# Patient Record
Sex: Male | Born: 1950 | Race: White | Hispanic: No | Smoking: Former smoker
Health system: Southern US, Community
[De-identification: ages and names within clinical notes are randomized; demographics above are authoritative.]

## PROBLEM LIST (undated history)

## (undated) DIAGNOSIS — I10 Essential (primary) hypertension: Secondary | ICD-10-CM

## (undated) DIAGNOSIS — G20A1 Parkinson's disease without dyskinesia, without mention of fluctuations: Secondary | ICD-10-CM

## (undated) DIAGNOSIS — G20 Parkinson's disease: Secondary | ICD-10-CM

## (undated) DIAGNOSIS — F0280 Dementia in other diseases classified elsewhere without behavioral disturbance: Secondary | ICD-10-CM

## (undated) DIAGNOSIS — K469 Unspecified abdominal hernia without obstruction or gangrene: Secondary | ICD-10-CM

## (undated) DIAGNOSIS — R32 Unspecified urinary incontinence: Secondary | ICD-10-CM

## (undated) DIAGNOSIS — E785 Hyperlipidemia, unspecified: Secondary | ICD-10-CM

## (undated) DIAGNOSIS — K219 Gastro-esophageal reflux disease without esophagitis: Secondary | ICD-10-CM

## (undated) HISTORY — PX: INGUINAL HERNIA REPAIR: SUR1180

## (undated) HISTORY — DX: Essential (primary) hypertension: I10

## (undated) HISTORY — DX: Parkinson's disease without dyskinesia, without mention of fluctuations: G20.A1

## (undated) HISTORY — DX: Dementia in other diseases classified elsewhere, unspecified severity, without behavioral disturbance, psychotic disturbance, mood disturbance, and anxiety: G20

## (undated) HISTORY — PX: VASECTOMY: SHX75

## (undated) HISTORY — DX: Unspecified urinary incontinence: R32

## (undated) HISTORY — DX: Dementia in other diseases classified elsewhere, unspecified severity, without behavioral disturbance, psychotic disturbance, mood disturbance, and anxiety: F02.80

---

## 1999-11-26 ENCOUNTER — Ambulatory Visit (HOSPITAL_COMMUNITY): Admission: AD | Admit: 1999-11-26 | Discharge: 1999-11-26 | Payer: Self-pay | Admitting: Cardiovascular Disease

## 2004-01-25 ENCOUNTER — Ambulatory Visit: Payer: Self-pay | Admitting: Internal Medicine

## 2004-02-01 ENCOUNTER — Ambulatory Visit: Payer: Self-pay | Admitting: Internal Medicine

## 2004-07-18 ENCOUNTER — Ambulatory Visit: Payer: Self-pay | Admitting: Unknown Physician Specialty

## 2006-07-14 ENCOUNTER — Encounter: Payer: Self-pay | Admitting: General Practice

## 2006-07-29 ENCOUNTER — Encounter: Payer: Self-pay | Admitting: General Practice

## 2006-09-15 ENCOUNTER — Ambulatory Visit: Payer: Self-pay | Admitting: Internal Medicine

## 2007-08-03 ENCOUNTER — Ambulatory Visit: Payer: Self-pay | Admitting: Internal Medicine

## 2009-12-18 ENCOUNTER — Ambulatory Visit: Payer: Self-pay | Admitting: Unknown Physician Specialty

## 2009-12-19 LAB — PATHOLOGY REPORT

## 2010-06-29 ENCOUNTER — Emergency Department: Payer: Self-pay | Admitting: Emergency Medicine

## 2010-09-30 ENCOUNTER — Emergency Department: Payer: Self-pay | Admitting: Unknown Physician Specialty

## 2012-06-18 ENCOUNTER — Other Ambulatory Visit: Payer: Self-pay | Admitting: Diagnostic Neuroimaging

## 2012-07-23 ENCOUNTER — Other Ambulatory Visit: Payer: Self-pay | Admitting: Diagnostic Neuroimaging

## 2012-09-24 ENCOUNTER — Other Ambulatory Visit: Payer: Self-pay | Admitting: Diagnostic Neuroimaging

## 2012-11-18 ENCOUNTER — Encounter (HOSPITAL_COMMUNITY): Payer: Self-pay | Admitting: Emergency Medicine

## 2012-11-18 ENCOUNTER — Emergency Department (HOSPITAL_COMMUNITY)
Admission: EM | Admit: 2012-11-18 | Discharge: 2012-11-18 | Disposition: A | Discharge status: Home / Self Care | Payer: 59 | Attending: Emergency Medicine | Admitting: Emergency Medicine

## 2012-11-18 DIAGNOSIS — I10 Essential (primary) hypertension: Secondary | ICD-10-CM | POA: Insufficient documentation

## 2012-11-18 DIAGNOSIS — T428X1A Poisoning by antiparkinsonism drugs and other central muscle-tone depressants, accidental (unintentional), initial encounter: Secondary | ICD-10-CM | POA: Insufficient documentation

## 2012-11-18 DIAGNOSIS — G20A1 Parkinson's disease without dyskinesia, without mention of fluctuations: Secondary | ICD-10-CM | POA: Insufficient documentation

## 2012-11-18 DIAGNOSIS — G20 Parkinson's disease: Secondary | ICD-10-CM | POA: Insufficient documentation

## 2012-11-18 DIAGNOSIS — T428X5A Adverse effect of antiparkinsonism drugs and other central muscle-tone depressants, initial encounter: Secondary | ICD-10-CM | POA: Insufficient documentation

## 2012-11-18 DIAGNOSIS — Z79899 Other long term (current) drug therapy: Secondary | ICD-10-CM | POA: Insufficient documentation

## 2012-11-18 DIAGNOSIS — R112 Nausea with vomiting, unspecified: Secondary | ICD-10-CM | POA: Insufficient documentation

## 2012-11-18 DIAGNOSIS — Z87891 Personal history of nicotine dependence: Secondary | ICD-10-CM | POA: Insufficient documentation

## 2012-11-18 DIAGNOSIS — Z8719 Personal history of other diseases of the digestive system: Secondary | ICD-10-CM | POA: Insufficient documentation

## 2012-11-18 DIAGNOSIS — T50905A Adverse effect of unspecified drugs, medicaments and biological substances, initial encounter: Secondary | ICD-10-CM

## 2012-11-18 HISTORY — DX: Parkinson's disease: G20

## 2012-11-18 HISTORY — DX: Parkinson's disease without dyskinesia, without mention of fluctuations: G20.A1

## 2012-11-18 HISTORY — DX: Unspecified abdominal hernia without obstruction or gangrene: K46.9

## 2012-11-18 MED ORDER — SODIUM CHLORIDE 0.9 % IV BOLUS (SEPSIS)
1000.0000 mL | Freq: Once | INTRAVENOUS | Status: AC
  Administered 2012-11-18: 1000 mL via INTRAVENOUS

## 2012-11-18 MED ORDER — ONDANSETRON HCL 4 MG/2ML IJ SOLN
4.0000 mg | Freq: Once | INTRAMUSCULAR | Status: AC
  Administered 2012-11-18: 4 mg via INTRAVENOUS
  Filled 2012-11-18: qty 2

## 2012-11-18 MED ORDER — SODIUM CHLORIDE 0.9 % IV SOLN
INTRAVENOUS | Status: DC
Start: 2012-11-18 — End: 2012-11-18
  Administered 2012-11-18: 18:00:00 via INTRAVENOUS

## 2012-11-18 NOTE — ED Provider Notes (Signed)
CSN: [id number]     Arrival date & time 11/18/12  1520 History     First MD Initiated Contact with Patient 11/18/12 1547     Chief Complaint  Patient presents with  . Nausea  . Emesis   (Consider location/radiation/quality/duration/timing/severity/associated sxs/prior Treatment) Patient is a 62 y.o. male presenting with vomiting. The history is provided by the patient.  Emesis  after possibly taking an extra dose of his carbidopa prior to arrival. Patient was told to increase his dose to 100 mg a day from 50 mg a day. Patient thinks he mistakenly took 200 mg tablets. Has had nausea vomiting since then. Denies any headache. No syncope or near-syncope. Does note some dizziness. Takes carbidopa for his Parkinson's disease. No other medications used. Called EMS from work and was transported here  Past Medical History  Diagnosis Date  . Parkinson disease   . Abdominal hernia   . Hypertension    Past Surgical History  Procedure Laterality Date  . Hernia repair     History reviewed. No pertinent family history. History  Substance Use Topics  . Smoking status: Former Games developer  . Smokeless tobacco: Not on file  . Alcohol Use: Yes     Comment: occ    Review of Systems  Gastrointestinal: Positive for vomiting.  All other systems reviewed and are negative.    Allergies  Sulfa antibiotics and Tylenol  Home Medications   Current Outpatient Rx  Name  Route  Sig  Dispense  Refill  . Armodafinil (NUVIGIL) 50 MG tablet   Oral   Take 100 mg by mouth every morning.         . carbidopa-levodopa (SINEMET IR) 25-100 MG per tablet   Oral   Take 1 tablet by mouth 3 (three) times daily.         Marland Kitchen ibuprofen (ADVIL,MOTRIN) 200 MG tablet   Oral   Take 400 mg by mouth every 6 (six) hours as needed for headache.         . polyethylene glycol powder (GLYCOLAX/MIRALAX) powder   Oral   Take 17 g by mouth daily as needed (constipation).         . pramipexole (MIRAPEX) 1.5 MG  tablet   Oral   Take 1.5 mg by mouth 3 (three) times daily.         . selegiline (ELDEPRYL) 5 MG capsule   Oral   Take 5 mg by mouth 2 (two) times daily before a meal. 5mg  (1 capsule) in the morning and 5mg  (1 capsule) at noon          There were no vitals taken for this visit. Physical Exam  Nursing note and vitals reviewed. Constitutional: He is oriented to person, place, and time. He appears well-developed and well-nourished.  Non-toxic appearance. No distress.  HENT:  Head: Normocephalic and atraumatic.  Eyes: Conjunctivae, EOM and lids are normal. Pupils are equal, round, and reactive to light.  Neck: Normal range of motion. Neck supple. No tracheal deviation present. No mass present.  Cardiovascular: Normal rate, regular rhythm and normal heart sounds.  Exam reveals no gallop.   No murmur heard. Pulmonary/Chest: Effort normal and breath sounds normal. No stridor. No respiratory distress. He has no decreased breath sounds. He has no wheezes. He has no rhonchi. He has no rales.  Abdominal: Soft. Normal appearance and bowel sounds are normal. He exhibits no distension. There is no tenderness. There is no rebound and no CVA tenderness.  Musculoskeletal: Normal  range of motion. He exhibits no edema and no tenderness.  Neurological: He is alert and oriented to person, place, and time. He has normal strength. No cranial nerve deficit or sensory deficit. GCS eye subscore is 4. GCS verbal subscore is 5. GCS motor subscore is 6.  Skin: Skin is warm and dry. No abrasion and no rash noted.  Psychiatric: His affect is blunt. His speech is delayed. He is slowed.    ED Course   Procedures (including critical care time)  Labs Reviewed - No data to display No results found. No diagnosis found.  MDM   Patient was monitored here for 3 hours post ingestion he is now back to his baseline.. Patient's activity is at his baseline at this time and will be discharged  Toy Baker,  MD 11/18/12 [phone]

## 2012-11-18 NOTE — ED Notes (Signed)
Pt to ED Via EMS from work with c/o nausea, vomiting, and dizziness. Pt thinks he had reaction to his parkinson's meds. Pt states dose of Carbidopa increased from 12.5mg  x2 day to 100mg  x1 day and he took first dose today. Pt is followed by [facility] clinic in [place]. Per EMS, BP-140/90, HR-82, RR-16, CBG-108.

## 2012-12-14 ENCOUNTER — Ambulatory Visit: Payer: Self-pay | Admitting: Nurse Practitioner

## 2013-06-24 ENCOUNTER — Emergency Department: Payer: Self-pay | Admitting: Emergency Medicine

## 2013-06-24 LAB — COMPREHENSIVE METABOLIC PANEL
ANION GAP: 3 — AB (ref 7–16)
Albumin: 4.1 g/dL (ref 3.4–5.0)
Alkaline Phosphatase: 94 U/L
BILIRUBIN TOTAL: 0.8 mg/dL (ref 0.2–1.0)
BUN: 14 mg/dL (ref 7–18)
CALCIUM: 8.2 mg/dL — AB (ref 8.5–10.1)
CO2: 29 mmol/L (ref 21–32)
Chloride: 105 mmol/L (ref 98–107)
Creatinine: 1.07 mg/dL (ref 0.60–1.30)
Glucose: 106 mg/dL — ABNORMAL HIGH (ref 65–99)
Osmolality: 275 (ref 275–301)
Potassium: 3.7 mmol/L (ref 3.5–5.1)
SGOT(AST): 29 U/L (ref 15–37)
SODIUM: 137 mmol/L (ref 136–145)
Total Protein: 8.1 g/dL (ref 6.4–8.2)

## 2013-06-24 LAB — DRUG SCREEN, URINE
Amphetamines, Ur Screen: POSITIVE (ref ?–1000)
Barbiturates, Ur Screen: NEGATIVE (ref ?–200)
Benzodiazepine, Ur Scrn: NEGATIVE (ref ?–200)
CANNABINOID 50 NG, UR ~~LOC~~: NEGATIVE (ref ?–50)
COCAINE METABOLITE, UR ~~LOC~~: NEGATIVE (ref ?–300)
MDMA (Ecstasy)Ur Screen: NEGATIVE (ref ?–500)
Methadone, Ur Screen: NEGATIVE (ref ?–300)
OPIATE, UR SCREEN: NEGATIVE (ref ?–300)
PHENCYCLIDINE (PCP) UR S: NEGATIVE (ref ?–25)
Tricyclic, Ur Screen: NEGATIVE (ref ?–1000)

## 2013-06-24 LAB — URINALYSIS, COMPLETE
BACTERIA: NONE SEEN
BLOOD: NEGATIVE
Bilirubin,UR: NEGATIVE
GLUCOSE, UR: NEGATIVE mg/dL (ref 0–75)
LEUKOCYTE ESTERASE: NEGATIVE
Nitrite: NEGATIVE
PH: 6 (ref 4.5–8.0)
Protein: NEGATIVE
SPECIFIC GRAVITY: 1.023 (ref 1.003–1.030)
SQUAMOUS EPITHELIAL: NONE SEEN
WBC UR: 3 /HPF (ref 0–5)

## 2013-06-24 LAB — ACETAMINOPHEN LEVEL: Acetaminophen: <2 ug/mL

## 2013-06-24 LAB — ETHANOL
Ethanol %: <0.003 % (ref 0.000–0.080)
Ethanol: <3 mg/dL

## 2013-06-24 LAB — CBC
HCT: 45.4 % (ref 40.0–52.0)
HGB: 15.3 g/dL (ref 13.0–18.0)
MCH: 29.5 pg (ref 26.0–34.0)
MCHC: 33.7 g/dL (ref 32.0–36.0)
MCV: 88 fL (ref 80–100)
PLATELETS: 186 10*3/uL (ref 150–440)
RBC: 5.19 10*6/uL (ref 4.40–5.90)
RDW: 14 % (ref 11.5–14.5)
WBC: 9.1 10*3/uL (ref 3.8–10.6)

## 2013-06-24 LAB — SALICYLATE LEVEL

## 2013-08-29 ENCOUNTER — Encounter: Payer: Self-pay | Admitting: Neurology

## 2013-08-31 DIAGNOSIS — G4752 REM sleep behavior disorder: Secondary | ICD-10-CM | POA: Insufficient documentation

## 2013-08-31 DIAGNOSIS — R413 Other amnesia: Secondary | ICD-10-CM | POA: Insufficient documentation

## 2013-09-27 ENCOUNTER — Encounter: Payer: Self-pay | Admitting: Neurology

## 2013-10-17 ENCOUNTER — Telehealth: Payer: Self-pay | Admitting: Neurology

## 2013-10-17 ENCOUNTER — Ambulatory Visit: Payer: 59 | Admitting: Neurology

## 2013-10-17 NOTE — Telephone Encounter (Signed)
Pt called and states that he is sick and is running to the potty and can not make it her to see you. He would like to resch and i just wanted to make sure it is ok with you?

## 2013-10-24 ENCOUNTER — Ambulatory Visit (INDEPENDENT_AMBULATORY_CARE_PROVIDER_SITE_OTHER): Payer: 59 | Admitting: Neurology

## 2013-10-24 ENCOUNTER — Encounter: Payer: Self-pay | Admitting: Neurology

## 2013-10-24 VITALS — BP 116/66 | HR 58 | Resp 14 | Ht 69.0 in | Wt 166.0 lb

## 2013-10-24 DIAGNOSIS — G20 Parkinson's disease: Secondary | ICD-10-CM

## 2013-10-24 DIAGNOSIS — G4752 REM sleep behavior disorder: Secondary | ICD-10-CM | POA: Insufficient documentation
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
## 2013-10-24 NOTE — Patient Instructions (Signed)
1. Do not take your Mirapex. We will see you in 3 weeks.

## 2013-10-30 ENCOUNTER — Other Ambulatory Visit: Payer: Self-pay | Admitting: Diagnostic Neuroimaging

## 2013-10-30 ENCOUNTER — Telehealth: Payer: Self-pay | Admitting: Neurology

## 2013-10-30 ENCOUNTER — Other Ambulatory Visit: Payer: Self-pay | Admitting: Neurology

## 2013-10-30 NOTE — Telephone Encounter (Signed)
Spoke with patient. He stopped his Mirapex on 10/24/2013. He states yesterday he starting having an intense throbbing constant pain in his back and shoulders. He states this has moved all the way down his legs and his entire body aches. He is having a hard time sleeping due to this. He did take a flexeril, which helped enough for him to get a little bit of sleep. He states his tremors have also increased. He is taking Carbidopa Levodopa 50/200 1 tablets TID. He is no longer splitting these in half. Please advise.

## 2013-10-30 NOTE — Telephone Encounter (Signed)
Called patient and informed him that Dr. Carles Collet out of the office until Wednesday.  He reports having a achy generalized pain since stopping the Mirapex and increasing his Sinemet. He was previously taking Mirapex 1.5 mg 3 times daily.  He can try to take Mirapex 1.5 mg once daily and see if this helps.    Donika K. Posey Pronto, DO

## 2013-10-30 NOTE — Telephone Encounter (Signed)
Please call pt regarding med change and pain / Lucas Edwards

## 2013-10-31 NOTE — Telephone Encounter (Signed)
Yes, I think it should be fine.  Please let him to to be cautious taking this medication because of its interaction with his selegiline.    Jaelynn Currier K. Posey Pronto, DO

## 2013-10-31 NOTE — Telephone Encounter (Signed)
Yes, he states he was having a rough time sleeping at all due to the aching.

## 2013-10-31 NOTE — Telephone Encounter (Signed)
How has he done with the sleepiness when he was off of mirapex?

## 2013-10-31 NOTE — Telephone Encounter (Signed)
Spoke with patient and he has noticed no change in his sleepiness since stopping Mirapex. He is back on the one tablet of Mirapex and his aches are better. His legs still have increased tightness and twitching. Made him aware I would relay the information and we would see him at his appt on 11/15/2013.
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
## 2013-10-31 NOTE — Telephone Encounter (Signed)
You spoke with patient yesterday - this original RX did not come from [place]. Okay to fill?

## 2013-11-15 ENCOUNTER — Encounter: Payer: Self-pay | Admitting: Neurology

## 2013-11-15 ENCOUNTER — Ambulatory Visit (INDEPENDENT_AMBULATORY_CARE_PROVIDER_SITE_OTHER): Payer: 59 | Admitting: Neurology

## 2013-11-15 VITALS — BP 136/90 | HR 84 | Resp 16 | Ht 69.0 in | Wt 159.0 lb

## 2013-11-15 DIAGNOSIS — G4752 REM sleep behavior disorder: Secondary | ICD-10-CM

## 2013-11-15 DIAGNOSIS — K5900 Constipation, unspecified: Secondary | ICD-10-CM

## 2013-11-15 DIAGNOSIS — G20 Parkinson's disease: Secondary | ICD-10-CM

## 2013-11-15 MED ORDER — CARBIDOPA-LEVODOPA 25-100 MG PO TABS: ORAL_TABLET | ORAL | Status: DC

## 2013-11-15 MED ORDER — CARBIDOPA-LEVODOPA 25-100 MG PO TABS
2.0000 | ORAL_TABLET | Freq: Once | ORAL | Status: AC
  Administered 2013-11-15: 2 via ORAL

## 2013-11-15 MED ORDER — CLONAZEPAM 0.5 MG PO TABS: 0.5000 mg | ORAL_TABLET | Freq: Every evening | ORAL | Status: DC | PRN

## 2013-11-15 NOTE — Progress Notes (Signed)
Lucas Edwards was seen today in the movement disorders clinic for neurologic consultation at the request of Lucas Edwards.    He has seen multiple other neurologists.  I think I have reviewed all of his other prior neurology records, which I will summarize.  The patient was first seen and diagnosed with Parkinson's disease by Lucas Edwards in September, 2009 after presenting to her with left hand resting tremor.  He was started on selegiline.  One month later, he followed up and was started on Requip in October, 2009.  He felt that Requip caused diarrhea and then changed providers to Lucas Edwards, also at [facility] neurology at the time.  He was changed to Mirapex in December, 2009.  He followed up in February, 2010 and was very drowsy.  It was felt that this could be secondary to Mirapex.  In June, 2010 he began to have significant orthostatic hypotension and patient states that cutting back terazosin helped with this.  In July, 2010 he had a nocturnal polysomnogram because of the excessive daytime hypersomnolence which was essentially unremarkable.  He changed Mirapex to Mirapex ER to see if that would help the EDS, but he was still sleepy and his insurance would not approve the Mirapex ER, so he went back to the traditional form.  In March, 2011 he had another nocturnal polysomnogram, followed this time by an MSLT.  The nocturnal polysomnogram was once again normal and the MSLT did not demonstrate any sleep onset REM.  He was diagnosed with idiopathic hypersomnolence.  A mean sleep latency was 4.5 minutes, but 0/5 naps had achieved REM sleep.  He was placed on Nuvigil but has recently changed to provigil.  Ultimately, he transitioned care to Lucas Edwards.  Lucas Edwards just recently saw the patient on 10/11/2013.  Because of hypersomnolence, the patient is splitting the carbidopa/levodopa 50/200 and not taking the immediate release formulation.  However, he states today that he splits it because of dyskinesia  when taking a full pill.  He remains on the Mirapex.  11/15/13 update:  Pt returns for f/u.  Last visit, I had him hold his mirapex because of EDS.  He called and c/o worsening sx's off the mirapex and was told by my partner to go back on the mirapex once per day (was on it tid) but he didn't do that and states today that he doesn't remember that conversation.  I was out of the office when he called.  He is no longer sleepy and in fact, is exactly the opposite.  He has such insomnia and is so stiff that he is frustrated by how bad he feels.  He is most frustrated by the insomnia.  He is on 0.25 mg of klonopin.  Has been holding the provigil.  Is on carbidopa/levodopa 50/200 tid and last took it at 6:30am and was seen today at 8:15 am.  Also c/o constipation  PREVIOUS MEDICATIONS: Sinemet, Sinemet CR, Mirapex, Requip and Eldpryl  ALLERGIES:   Allergies  Allergen Reactions  . Sulfa Antibiotics Hives  . Tylenol [Acetaminophen] Itching    CURRENT MEDICATIONS:  Current Outpatient Prescriptions on File Prior to Visit  Medication Sig Dispense Refill  . acyclovir (ZOVIRAX) 400 MG tablet Take 400 mg by mouth 2 (two) times daily.       . carbidopa-levodopa (SINEMET CR) 50-200 MG per tablet Take 1 tablet by mouth 3 (three) times daily.       Marland Kitchen CIALIS 20 MG tablet       .  clonazePAM (KLONOPIN) 0.5 MG tablet Take 0.25 mg by mouth at bedtime as needed for anxiety.       . cyclobenzaprine (FLEXERIL) 5 MG tablet TAKE 1 TABLET BY MOUTH EVERY NIGHT AS NEEDED FOR MUSCLE SPASM  30 tablet  0  . docusate sodium (COLACE) 100 MG capsule Take 100 mg by mouth 2 (two) times daily.      . modafinil (PROVIGIL) 200 MG tablet Take 200 mg by mouth daily.      . Multiple Vitamins-Minerals (IMMUNE SUPPORT VITAMIN C PO) Take by mouth.      . naproxen sodium (ANAPROX) 220 MG tablet Take 220 mg by mouth 2 (two) times daily with a meal.      . pantoprazole (PROTONIX) 40 MG tablet Take 40 mg by mouth daily.       . selegiline  (ELDEPRYL) 5 MG capsule Take 5 mg by mouth 2 (two) times daily before a meal. 5mg  (1 capsule) in the morning and 5mg  (1 capsule) at noon      . terazosin (HYTRIN) 1 MG capsule Take 1 mg by mouth daily.       No current facility-administered medications on file prior to visit.    PAST MEDICAL HISTORY:   Past Medical History  Diagnosis Date  . Parkinson disease   . Abdominal hernia   . Hypertension     PAST SURGICAL HISTORY:   Past Surgical History  Procedure Laterality Date  . Inguinal hernia repair Bilateral   . Vasectomy      SOCIAL HISTORY:   History   Social History  . Marital Status: Single    Spouse Name: N/A    Number of Children: N/A  . Years of Education: N/A   Occupational History  . Not on file.   Social History Main Topics  . Smoking status: Former Research scientist (life sciences)  . Smokeless tobacco: Not on file     Comment: during high school/college  . Alcohol Use: Yes     Comment: social   . Drug Use: Yes     Comment: marijuana occ  . Sexual Activity: Not on file   Other Topics Concern  . Not on file   Social History Narrative  . No narrative on file    FAMILY HISTORY:   Family Status  Relation Status Death Age  . Mother Deceased     MS  . Father Deceased     heart disease, stroke  . Brother Deceased     banti's disease  . Sister Deceased     breast cancer  . Maternal Grandmother Deceased     pd    ROS:  A complete 10 system review of systems was obtained and was unremarkable apart from what is mentioned above.  PHYSICAL EXAMINATION:    VITALS:   Filed Vitals:   11/15/13 0801  BP: 148/92  Pulse: 84  Resp: 16  Height: 5\' 9"  (1.753 m)  Weight: 159 lb (72.122 kg)   Pt did not take medication today as he had to drive here.    GEN:  The patient appears stated age and is in NAD. HEENT:  Normocephalic, atraumatic.  The mucous membranes are moist. The superficial temporal arteries are without ropiness or tenderness. CV:  RRR Lungs:  CTAB Neck/HEME:   There are no carotid bruits bilaterally.  Neurological examination:  Orientation:   The patient is alert and oriented x3. Fund of knowledge is appropriate.  Recent and remote memory are intact.  Attention and concentration are  normal.    Able to name objects and repeat phrases. Cranial nerves: There is good facial symmetry. There is significant facial hypomimia.  Pupils are equal round and reactive to light bilaterally. Fundoscopic exam reveals clear margins bilaterally. Extraocular muscles are intact. The visual fields are full to confrontational testing. The speech is fluent and clear. Soft palate rises symmetrically and there is no tongue deviation. Hearing is intact to conversational tone. Sensation: Sensation is intact to light and pinprick throughout (facial, trunk, extremities). Vibration is intact at the bilateral big toe. There is no extinction with double simultaneous stimulation. There is no sensory dermatomal level identified. Motor: Strength is 5/5 in the bilateral upper and lower extremities.   Shoulder shrug is equal and symmetric.  There is no pronator drift. Deep tendon reflexes: Deep tendon reflexes are 2/4 at the bilateral biceps, triceps, brachioradialis, patella and achilles. Plantar responses are downgoing bilaterally.  Movement examination: Tone: There is moderate increased tone bilaterally, which is much worse than previous.  However, after given 2 carbidopa/levodopa 25/100 in the office and re-examined, he had no rigidity on the R and mild on the L Abnormal movements: There is no tremor noted Coordination:  There is decremation with RAM's, seen bilaterally, including alternating supination and pronation of the forearm, hand opening and closing, finger taps, heel taps and toe taps.  This was improved after the administration of carbidopa/levodopa 25/100 in the office.   Gait and Station: The patient has  difficulty arising out of a deep-seated chair without the use of the  hands. The patient's stride length is decreased before administration of carbidopa/levodopa 25/100 and better after.  The patient has a negative pull test.      ASSESSMENT/PLAN:  1.  Idiopathic Parkinsons disease  -diagnosed in 2009  -He looked worse today in terms of the motor sx's of PD which is not surprising given that I held the mirapex but we were able to prove that the mirapex was the source of the sleep attacks.  While he says repetitively that he would rather have the sleep attacks than feel like he has the last few weeks, I think that we just need to optimize levodopa dosing and try to go back to the carbidopa/levodopa 25/100 instead of the 50/200.  He was very nervous as he said that he had a syncopal episode years ago on the 50/200 but I suspect that this was a sleep attack in combination with being on mirapex, although it certainly could have been a hypotensive event associated with levodopa.  In any case, I gave him 2 carbidopa/levodopa 25/100 in the office today dissolved in ginger ale and had him sit an additional 30 minutes before he was reexamined.  He was markedly improved after that and had almost no rigidity, and BP remained stable.  He felt better after the addition of levodopa IR as well.  I will have him start taking carbidopa/levodopa 25/100, 2 in the AM, 1 in the afternoon, 2 in the evening and take carbidopa/levodopa 50/200 at bedtime.  -Pt stated that was told in past was not a DBS candidate.  Not sure who told him this (believes Dr. Leta Baptist but I don't have notes from him).  He asks questions about DBS.  I think that he could potentially be a DBS candidate and discussed this at length today.  Greater than 50% of 60 min visit (60 min was face to face and not including the wait time) was in counseling.  Discussed surgery logistics.  He is not sure he is ready.  Invited him to pt education event at [facility].  -DC selegeline  2.  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  RBD  -continue klonopin but increase dose to 0.5 mg nightly to help with insomnia as well. 4,  Constipation  -copy of the rancho recipe give.  5.  Will f/u with me in 3 weeks and Lucas Edwards at previously scheduled appt in October.

## 2013-11-15 NOTE — Patient Instructions (Addendum)
1. Stop Selegiline.  2. Start Carbidopa Levodopa 25/100 IR as follows: Take two tablets in the morning, 1 tablet in the afternoon, 2 tablets at dinner.  3. Continue Carbidopa Levodopa 50/200 CR at bedtime.  4. Continue Clonazepam 0.5 mg at bedtime.  5. Constipation and Parkinson's disease:  1. Rancho recipe for constipation in Parkinsons Disease:  -1 cup of bran, 2 cups of applesauce in 1 cup of prune juice  2.  Increase fiber intake (Metamucil,vegetables)  3.  Regular, moderate exercise can be beneficial.  4.  Avoid medications causing constipation, such as medications like antacids with calcium or magnesium  5.  Laxative overuse should be avoided.  6.  Stool softeners (Colace) can help with chronic constipation. 6. The Parkinson's Support Group meets at [facility] on November 30, 2013 at 10:30 am.

## 2013-12-13 ENCOUNTER — Encounter: Payer: Self-pay | Admitting: Neurology

## 2013-12-13 ENCOUNTER — Ambulatory Visit (INDEPENDENT_AMBULATORY_CARE_PROVIDER_SITE_OTHER): Payer: 59 | Admitting: Neurology

## 2013-12-13 VITALS — BP 134/88 | HR 82 | Ht 69.0 in | Wt 158.0 lb

## 2013-12-13 DIAGNOSIS — G4752 REM sleep behavior disorder: Secondary | ICD-10-CM

## 2013-12-13 DIAGNOSIS — R279 Unspecified lack of coordination: Secondary | ICD-10-CM

## 2013-12-13 DIAGNOSIS — G20 Parkinson's disease: Secondary | ICD-10-CM

## 2013-12-13 DIAGNOSIS — G249 Dystonia, unspecified: Secondary | ICD-10-CM

## 2013-12-13 MED ORDER — CARBIDOPA-LEVODOPA 25-100 MG PO TABS: 2.0000 | ORAL_TABLET | Freq: Three times a day (TID) | ORAL | Status: DC

## 2013-12-13 NOTE — Progress Notes (Signed)
Lucas Edwards was seen today in the movement disorders clinic for neurologic consultation at the request of [name].    He has seen multiple other neurologists.  I think I have reviewed all of his other prior neurology records, which I will summarize.  The patient was first seen and diagnosed with Parkinson's disease by [name] in September, 2009 after presenting to her with left hand resting tremor.  He was started on selegiline.  One month later, he followed up and was started on Requip in October, 2009.  He felt that Requip caused diarrhea and then changed providers to [name], also at [facility] neurology at the time.  He was changed to Mirapex in December, 2009.  He followed up in February, 2010 and was very drowsy.  It was felt that this could be secondary to Mirapex.  In June, 2010 he began to have significant orthostatic hypotension and patient states that cutting back terazosin helped with this.  In July, 2010 he had a nocturnal polysomnogram because of the excessive daytime hypersomnolence which was essentially unremarkable.  He changed Mirapex to Mirapex ER to see if that would help the EDS, but he was still sleepy and his insurance would not approve the Mirapex ER, so he went back to the traditional form.  In March, 2011 he had another nocturnal polysomnogram, followed this time by an MSLT.  The nocturnal polysomnogram was once again normal and the MSLT did not demonstrate any sleep onset REM.  He was diagnosed with idiopathic hypersomnolence.  A mean sleep latency was 4.5 minutes, but 0/5 naps had achieved REM sleep.  He was placed on Nuvigil but has recently changed to provigil.  Ultimately, he transitioned care to [name].  [name] just recently saw the patient on 10/11/2013.  Because of hypersomnolence, the patient is splitting the carbidopa/levodopa 50/200 and not taking the immediate release formulation.  However, he states today that he splits it because of dyskinesia  when taking a full pill.  He remains on the Mirapex.  11/15/13 update:  Pt returns for f/u.  Last visit, I had him hold his mirapex because of EDS.  He called and c/o worsening sx's off the mirapex and was told by my partner to go back on the mirapex once per day (was on it tid) but he didn't do that and states today that he doesn't remember that conversation.  I was out of the office when he called.  He is no longer sleepy and in fact, is exactly the opposite.  He has such insomnia and is so stiff that he is frustrated by how bad he feels.  He is most frustrated by the insomnia.  He is on 0.25 mg of klonopin.  Has been holding the provigil.  Is on carbidopa/levodopa 50/200 tid and last took it at 6:30am and was seen today at 8:15 am.  Also c/o constipation  12/13/13 update:  The patient returns today for followup.  He was having sleep attacks with Mirapex.  This has resolved off of mirapex.    Last visit, I changed him to carbidopa/levodopa immediate release and he is currently on 25/100, 2 tablets in the morning, one in the afternoon and 2 in the evening with an additional 50/200 at nighttime.  I discontinued his selegiline because of insomnia. He is sleeping at night but has trouble rolling over.  He thinks that he needs more levodopa at lunch.   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  His insomnia is better.   He is looking at alternative living options such as independent assisted living.  He is very much considering DBS therapy.  He went to a lecture at [facility] Re: DBS.   PREVIOUS MEDICATIONS: Sinemet, Sinemet CR, Mirapex, Requip and Eldpryl  ALLERGIES:   Allergies  Allergen Reactions  . Sulfa Antibiotics Hives  . Tylenol [Acetaminophen] Itching    CURRENT MEDICATIONS:  Current Outpatient Prescriptions on File Prior to Visit  Medication Sig Dispense Refill  . acyclovir (ZOVIRAX) 400 MG tablet Take 400 mg by mouth 2 (two) times  daily.       . carbidopa-levodopa (SINEMET CR) 50-200 MG per tablet Take 1 tablet by mouth 3 (three) times daily.       . carbidopa-levodopa (SINEMET IR) 25-100 MG per tablet 2 tablets in the morning, 1 tablet in the afternoon, 2 tablets at dinner  150 tablet  2  . CIALIS 20 MG tablet       . clonazePAM (KLONOPIN) 0.5 MG tablet Take 1 tablet (0.5 mg total) by mouth at bedtime as needed for anxiety.  30 tablet  5  . cyclobenzaprine (FLEXERIL) 5 MG tablet TAKE 1 TABLET BY MOUTH EVERY NIGHT AS NEEDED FOR MUSCLE SPASM  30 tablet  0  . docusate sodium (COLACE) 100 MG capsule Take 100 mg by mouth 2 (two) times daily.      . modafinil (PROVIGIL) 200 MG tablet Take 200 mg by mouth daily.      . Multiple Vitamins-Minerals (IMMUNE SUPPORT VITAMIN C PO) Take by mouth.      . naproxen sodium (ANAPROX) 220 MG tablet Take 220 mg by mouth 2 (two) times daily with a meal.      . pantoprazole (PROTONIX) 40 MG tablet Take 40 mg by mouth daily.       . selegiline (ELDEPRYL) 5 MG capsule Take 5 mg by mouth 2 (two) times daily before a meal. 5mg  (1 capsule) in the morning and 5mg  (1 capsule) at noon      . terazosin (HYTRIN) 1 MG capsule Take 1 mg by mouth daily.       No current facility-administered medications on file prior to visit.    PAST MEDICAL HISTORY:   Past Medical History  Diagnosis Date  . Parkinson disease   . Abdominal hernia   . Hypertension     PAST SURGICAL HISTORY:   Past Surgical History  Procedure Laterality Date  . Inguinal hernia repair Bilateral   . Vasectomy      SOCIAL HISTORY:   History   Social History  . Marital Status: Single    Spouse Name: N/A    Number of Children: N/A  . Years of Education: N/A   Occupational History  . Not on file.   Social History Main Topics  . Smoking status: Former Research scientist (life sciences)  . Smokeless tobacco: Not on file     Comment: during high school/college  . Alcohol Use: Yes     Comment: social   . Drug Use: Yes     Comment: marijuana occ  .  Sexual Activity: Not on file   Other Topics Concern  . Not on file   Social History Narrative  . No narrative on file    FAMILY HISTORY:   Family Status  Relation Status Death Age  . Mother Deceased     MS  . Father Deceased  heart disease, stroke  . Brother Deceased     banti's disease  . Sister Deceased     breast cancer  . Maternal Grandmother Deceased     pd    ROS:  A complete 10 system review of systems was obtained and was unremarkable apart from what is mentioned above.  PHYSICAL EXAMINATION:    VITALS:   Filed Vitals:   12/13/13 0841  BP: 134/88  Pulse: 82  Height: 5\' 9"  (1.753 m)  Weight: 158 lb (71.668 kg)    GEN:  The patient appears stated age and is in NAD. HEENT:  Normocephalic, atraumatic.  The mucous membranes are moist. The superficial temporal arteries are without ropiness or tenderness.  Neurological examination:  Orientation:   The patient is alert and oriented x3. Fund of knowledge is appropriate.  Recent and remote memory are intact.  Attention and concentration are normal.    Able to name objects and repeat phrases. Cranial nerves: There is good facial symmetry.  Pupils are equal round and reactive to light bilaterally. Fundoscopic exam reveals clear margins bilaterally. Extraocular muscles are intact. The visual fields are full to confrontational testing. The speech is fluent and clear. Soft palate rises symmetrically and there is no tongue deviation. Hearing is intact to conversational tone. Sensation: Sensation is intact to light touch throughout Motor: Strength is 5/5 in the bilateral upper and lower extremities.   Shoulder shrug is equal and symmetric.  There is no pronator drift.  Movement examination: Tone: There is normal tone bilaterally. Abnormal movements: There is no tremor noted.  There is dyskinesia noted. Coordination:  There is decremation only with heel taps and toe taps, left greater than right. Gait and Station: The  patient has no difficulty arising out of a deep-seated chair without the use of the hands. The patient's stride length is normal today.  ASSESSMENT/PLAN:  1.  Idiopathic Parkinsons disease  -diagnosed in 2009  -He looked better today.  He is having motor fluctuations and had dyskinesia today.  I do think that he is likely a good DBS candidate.  I have seen him in the off state last time and certainly saw him in the "on" state this time.  He had significant sleep attacks with Mirapex. He is interested in DBS therapy.  He wants to talk to [name] further about it.  If he wants to proceed with DBS therapy here, then I want to do a complete UPDRS motor on/off score.  He will also need neuropsych testing.  He and I fully talked about risks and benefits.  He asked numerous questions that I answered them to the best of my ability.  He understands that DBS is not a cure for Parkinson's disease.  He understands risk of infection and stroke.  Greater than 50% of the 40 minute visit spent in counseling.  -We will increase the patient's levodopa to 2 tablets 3 times per day patient understands that this likely will increase dyskinesia.  Talked about amantadine.  He will let me know if he would like to utilize this for dyskinesia control. 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 4,  Constipation  -copy of the rancho recipe give. 5.  patient has an appointment with [name] in October.  He will let me know if he would like to proceed with the DBS workup.

## 2013-12-16 ENCOUNTER — Other Ambulatory Visit: Payer: Self-pay | Admitting: Neurology

## 2013-12-18 NOTE — Telephone Encounter (Signed)
The last refill looks like it was sent in by Dr. Posey Pronto but this is a Dr. Carles Collet patient.  No refills were given.  Does she want to continue him on this?

## 2013-12-18 NOTE — Telephone Encounter (Signed)
Call patient and ask him if taking this and if so, why.  Generally not used for PD

## 2013-12-18 NOTE — Telephone Encounter (Signed)
Dr. Tat - please advise.  

## 2013-12-18 NOTE — Telephone Encounter (Signed)
Spoke with patient and he states that he uses the Flexeril occasionally for night time muscle cramps. Please advise.

## 2014-01-21 ENCOUNTER — Other Ambulatory Visit: Payer: Self-pay | Admitting: Neurology

## 2014-01-21 DIAGNOSIS — G20 Parkinson's disease: Secondary | ICD-10-CM

## 2014-01-22 NOTE — Telephone Encounter (Signed)
Please advise if okay to fill  

## 2014-01-22 NOTE — Telephone Encounter (Signed)
Lucas Edwards, call patient and find out if still wants to consider proceeding with DBS.  Dr Melrose Nakayama is his primary neurologist and likely the one to fill flexeril.

## 2014-01-22 NOTE — Telephone Encounter (Signed)
Referral sent to Dr Conley Canal - they will contact patient with an appt. Appt made for on/off testing in the office.

## 2014-01-22 NOTE — Telephone Encounter (Signed)
Patient made aware to contact Dr Melrose Nakayama about Flexeril RX. He does want to proceed with DBS workup. Please advise which referrals you would like me to make.

## 2014-01-22 NOTE — Telephone Encounter (Signed)
Pt returning call to [facility]. CB# [phone] / Sherri S.

## 2014-01-22 NOTE — Telephone Encounter (Signed)
Left message on machine for patient to call back.

## 2014-01-22 NOTE — Addendum Note (Signed)
Addended byAnnamaria Helling on: 01/22/2014 04:27 PM   Modules accepted: Orders

## 2014-01-26 DIAGNOSIS — N400 Enlarged prostate without lower urinary tract symptoms: Secondary | ICD-10-CM | POA: Insufficient documentation

## 2014-02-13 ENCOUNTER — Encounter: Payer: Self-pay | Admitting: Neurology

## 2014-02-13 ENCOUNTER — Ambulatory Visit (INDEPENDENT_AMBULATORY_CARE_PROVIDER_SITE_OTHER): Payer: 59 | Admitting: Neurology

## 2014-02-13 VITALS — BP 150/88 | HR 80 | Ht 69.0 in | Wt 150.4 lb

## 2014-02-13 DIAGNOSIS — G20 Parkinson's disease: Secondary | ICD-10-CM

## 2014-02-13 DIAGNOSIS — F320 Major depressive disorder, single episode, mild: Secondary | ICD-10-CM

## 2014-02-13 DIAGNOSIS — G249 Dystonia, unspecified: Secondary | ICD-10-CM

## 2014-02-13 DIAGNOSIS — G4752 REM sleep behavior disorder: Secondary | ICD-10-CM

## 2014-02-13 NOTE — Patient Instructions (Addendum)
We have rescheduled your on/off test on 03/08/2014 at 2:30 pm. Please do not take any Parkinson's medication on this day.    Deep Brain Stimulation  Is it the right choice for me?   What is Deep Brain Stimulation (DBS) Surgery?  DBS is a surgical procedure used to treat symptoms of Parkinson's disease (PD). It involves the implantation of an electrode into the brain (one on each side). The area of the brain that is typically targeted in PD is the subthalamic nucleus.   How does DBS work?  PD is caused by the degeneration of brain cells in a specific part of the brain which make a chemical (neurotransmitter) called dopamine. As time goes by, more and more cells degenerate and the level of dopamine in the brain declines. As a result of this dopamine deficiency, there is a certain circuit in the brain which becomes abnormally overactive. Many symptoms of PD are due to this abnormal, overactive circuit. With DBS, high frequency electrical stimulation is used to disrupt this circuit, thereby blocking the symptoms of PD that were previously being mediated through that circuit. The three main symptoms of PD are shaking (tremor), slowness of movement (bradykinesia), and stiffness (rigidity). All of these symptoms are mediated through this small circuit. Therefore, DBS is very effective in blocking these symptoms. It is important to remember, however, that DBS does not "cure" PD, but rather is a very effective method of treating the symptoms of the disease.   What is actually done during the operation?  The surgical procedure involves the implantation of 2 electrodes (one on each side of the brain). The electrodes are connected to 2 wires, which are then connected to a generator- pacemaker like device (either one or two) in the chest. The generator (and wires) are placed under the skin similar to a cardiac pacemaker, thus the device itself is not visible. The implanted hardware does, however, produce a lump on  the chest where the generator is placed and two small bumps on the scalp where underneath there are small plastic caps which are screwed into the skull and secure the electrode.   What symptoms can I expect DBS to treat?  DBS treats many, but not all symptoms of PD. As mentioned above, tremor, stiffness (rigidity), and slowness of movement (bradykinesia) all respond well to DBS therapy. In addition, many patients with advanced PD have problems with what we call "motor fluctuations". This refers to the wearing off of medication before the next dose associated with breakthrough of PD symptoms, and at other times the effects of excess medication, such as involuntary wiggling (dyskinesia). Because the electrical stimulation is constant, the effect is continuous. Therefore motor fluctuations can be significantly reduced. Furthermore, after DBS most patients are able to significantly reduce the amount of Parkinson's medications they were previously taking. Therefore, side effects of these medications can be significantly reduced as well, and often completely eliminated. Common anti-Parkinson medication side effects include: involuntary wiggling (dyskinesia), visual distortions and hallucinations, nausea and vomiting, and lightheadedness.   What symptoms are not treated with DBS?  Some symptoms of PD are mediated through other brain circuits. Therefore, those symptoms would not be expected to improve with DBS. These symptoms include: soft and mumbled speech (hypophonia), balance trouble, and memory deficits. Even if the DBS surgery is done perfectly, the patient will still have PD. Therefore, because DBS does not block all of the effected brain circuits, the above mentioned symptoms will likely continue to progress and worsen with  time.   How functional can I expect to be following DBS surgery?  Most patients who are good candidates improve with DBS. Think about how functional you are now, when your medicines are  "kicked in" and working at their very best. After DBS we can often get you to that point and keep you there, without all of the fluctuations and the medication side effects. Some patients with PD have bad tremor that does not respond well to medication. DBS can work well to control tremor even when medication cannot.   What are the risks of surgery?  Because the surgery involves introducing a foreign object into the brain, there are inherent risks that are present. First, there is a very small risk, approximately 1%, of having bleeding into the brain causing symptoms similar to that of a stroke. Secondly, there is a 5-7% chance of having an infection related to the procedure. If the device gets infected, then the treatment usually requires that the infected hardware be removed temporarily while antibiotics are given. After the infection is resolved, then the hardware needs to be re-implanted. This would not leave the patient with permanent problems, but it is easy to understand how disappointed someone might be if they have to go through the surgery again. Typical symptoms of infection include redness, swelling, or pain around the device on the skin. There is theoretically a very small chance (much less than 1%) that an infection could spread to the brain. This, of course, would be much more serious. Another small risk of brain surgery is possible seizure (2-3%). A seizure produces transient sudden loss of consciousness and generalized shaking (convulsion). This can be caused by irritation of the brain during the operation. If a seizure occurs, it is almost always at the time of operation. It may require temporarily being treated with seizure medications, but this is typically only short term.   How much trouble is it to get DBS?  Unfortunately, undergoing DBS surgery is a process involving multiple steps. Even prior to surgery, there are several steps that must be done. The surgery itself takes place in three  separate parts. About a week prior to insertion of the electrodes, you will be seen in an ambulatory surgery center to put in markers into the skull, called fiducials. This allows [place] to plan the surgery and to better localize the area in which we will operate. One week later, stage 1 of the procedure will be done in which the electrodes are implanted. Approximately one week later, stage 2 of the surgery will be done in which the generator (battery) is inserted. Stage 1 of the surgery usually takes several hours. This is when the electrode is placed. This surgery has to be done while the patient is awake. Local anesthesia is used, so the procedure is not painful, but obviously it is a little scary to be operated on while you are awake. Furthermore, patients need to be off of their anti-Parkinson medication during the operation, so that we can more easily identify the abnormal circuit in the brain. It is unpleasant being off anti-Parkinson medication, even for this short time. Approximately 6 weeks after the electrode placement, programming of the device will take place. This allows [place] to alter the type of stimulation and optimize the beneficial effects. This is done over several clinic visits. The second visit is usually just a week after the first, but subsequent visits will be less frequent. Eventually you shouldn't need to be seen more than once  every 4 months or so. Overall, you should expect several programming visits before you see the full benefit from DBS surgery.   How long does the hardware last?  The generator runs on a battery inside the device. This battery typically needs to be replaced every 3 to 5 years. The battery replacement operation, however, is much easier than the initial elaborate operation. It is typically done as an outpatient procedure.   Does DBS always work?  The key to success is exact placement of the electrode. As you can imagine, the brain has many circuits which are closely  packed together. If the electrode is close to being in the right position, but not quite, then there may be a partial response rather than a complete response. If this happens, we may have to turn up the stimulation to try and more completely block the circuit. If we do this, however, we may effect other adjacent circuits that we are not intending to effect, and thereby produce stimulation-related side effects such as slurred speech or facial muscle pulling. These side effects can be easily eliminated by reducing the strength of the stimulation, but then some of the Parkinson symptoms may break through.   Is DBS the right choice for you?  As you can now see, there are many things that carefully need to be considered when making this decision. DBS is not appropriate for all patients with PD. The ultimate decision is yours to make. It is our job to provide you with all the pros and cons, so that you can make the choice that is right for you.  Logistical Details: Pre-Operative Visits:  1) "On-Off" Testing. This visit takes place in the clinic with Dr. Carles Collet several weeks prior to surgery to help determine if you are a good DBS candidate. You will come to the clinic having NOT TAKEN your PD medications. A series of physical examination tests will be done. Then you will be given a dose of carbidopa/levodopa dissolved in ginger ale. Approximately 30 minutes later you will be re-examined with the same tests to see how you respond.   **IT IS EXTREMELY IMPORTANT NOT TO TAKE YOUR PARKINSON MEDICATIONS ON THE DAY OF THIS CLINIC VISIT.   2) Neuropsychological Testing. This is standard testing in all potential candidates to help determine those patients that may be at risk for developing worsening cognition from the procedure. This is a long clinic visit (multiple hours).  3) Pre-Operative MRI. If you are deemed to be a good surgical candidate based on the above 2 visits, you will need to have MRI imaging done. It is  very important that we get high quality study. You must have someone accompany you to this visit as we may have to give you sedation in order to make sure the MRI images are of adequate quality.  What to expect regarding surgery:  1. The first step involves placement of the fiducial (reference) pins. This is done the week before surgery by Dr. Vertell Limber. You are given 5 local injections of anesthetic (numbing medication). Next, 5 pins are screwed into the skull. Following the placement of the pins, you will be transferred down to have a head CT scan. The CT is used in planning for the surgery.  2. Surgery typically takes place one week later. You will have been off all of your Parkinson medications.  3. You will have the sense of "hurry up and wait" multiple times throughout the day, but it is extremely important to remain  as patient as possible. It is during these times that we are busy "behind the scenes" doing the surgical planning with all of the imaging scans that you've had done.  4. In the pre-op area, you will meet with the nurses and anesthesia staff. You may have a catheter placed into the bladder. Once you are taken back to the OR suite, you will be placed in a "beach chair" position. You will not be under general anesthesia. We need you to be awake during certain parts to allow [place] to do important testing. The actual surgical procedure is not painful. It is done with local anesthetic agents. However, the procedure can take up to 6-8 hours, and it is expected that you'll become uncomfortable. We try to minimize any sedating medications, but can give you something if needed.  5. You will have a bad haircut, but it will grow back!  6. Following the surgery, you will stay overnight in the hospital for observation.  7. The following day, you will have a very special kind brain MRI scan to allow [place] to evaluate the placement of the electrodes as this is very helpful in subsequent programming. Usually,  patients are discharged home the day after surgery.   ** It is extremely important to remember that after having DBS surgery, you will no longer be able to have a typical MRI scan. This can lead to heating of the electrode wires causing serious burns to the brain and even death.   8. About 1 week later you will return for an outpatient surgery that lasts 1-2 hours during which the generator(s) will be placed. You will go home on the same day as the surgery. You will find that you are more uncomfortable after this surgery than your first surgery. You will be given medications to help with this. The pain from this surgery usually resolves in 2 or 3 days.

## 2014-02-13 NOTE — Progress Notes (Signed)
Lucas Edwards was seen today in the movement disorders clinic for neurologic consultation at the request of [name].    He has seen multiple other neurologists.  I think I have reviewed all of his other prior neurology records, which I will summarize.  The patient was first seen and diagnosed with Parkinson's disease by [name] in September, 2009 after presenting to her with left hand resting tremor.  He was started on selegiline.  One month later, he followed up and was started on Requip in October, 2009.  He felt that Requip caused diarrhea and then changed providers to [name], also at [facility] neurology at the time.  He was changed to Mirapex in December, 2009.  He followed up in February, 2010 and was very drowsy.  It was felt that this could be secondary to Mirapex.  In June, 2010 he began to have significant orthostatic hypotension and patient states that cutting back terazosin helped with this.  In July, 2010 he had a nocturnal polysomnogram because of the excessive daytime hypersomnolence which was essentially unremarkable.  He changed Mirapex to Mirapex ER to see if that would help the EDS, but he was still sleepy and his insurance would not approve the Mirapex ER, so he went back to the traditional form.  In March, 2011 he had another nocturnal polysomnogram, followed this time by an MSLT.  The nocturnal polysomnogram was once again normal and the MSLT did not demonstrate any sleep onset REM.  He was diagnosed with idiopathic hypersomnolence.  A mean sleep latency was 4.5 minutes, but 0/5 naps had achieved REM sleep.  He was placed on Nuvigil but has recently changed to provigil.  Ultimately, he transitioned care to [name].  [name] just recently saw the patient on 10/11/2013.  Because of hypersomnolence, the patient is splitting the carbidopa/levodopa 50/200 and not taking the immediate release formulation.  However, he states today that he splits it because of dyskinesia  when taking a full pill.  He remains on the Mirapex.  11/15/13 update:  Pt returns for f/u.  Last visit, I had him hold his mirapex because of EDS.  He called and c/o worsening sx's off the mirapex and was told by my partner to go back on the mirapex once per day (was on it tid) but he didn't do that and states today that he doesn't remember that conversation.  I was out of the office when he called.  He is no longer sleepy and in fact, is exactly the opposite.  He has such insomnia and is so stiff that he is frustrated by how bad he feels.  He is most frustrated by the insomnia.  He is on 0.25 mg of klonopin.  Has been holding the provigil.  Is on carbidopa/levodopa 50/200 tid and last took it at 6:30am and was seen today at 8:15 am.  Also c/o constipation  12/13/13 update:  The patient returns today for followup.  He was having sleep attacks with Mirapex.  This has resolved off of mirapex.    Last visit, I changed him to carbidopa/levodopa immediate release and he is currently on 25/100, 2 tablets in the morning, one in the afternoon and 2 in the evening with an additional 50/200 at nighttime.  I discontinued his selegiline because of insomnia. He is sleeping at night but has trouble rolling over.  He thinks that he needs more levodopa at lunch.   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  His insomnia is better.   He is looking at alternative living options such as independent assisted living.  He is very much considering DBS therapy.  He went to a lecture at [facility] Re: DBS.   02/13/14 update:  Pt returns today for UPDRS on/off testing as he wishes to proceed with possible DBS therapy if he is a candidate.  Unfortunately, he forgot and took his levodopa this morning.  He has had neuropsych testing done but results won't be back until next Wednesday.   He admits to some depression regarding the PD and also regarding living situation; he has some  family living with him and that isn't a great situation for him.  Feels that he has no support in regards to the PD.he continues to take B/levodopa either 2 tablets 3 times per day or 2 tablets in the morning, 2 in the afternoon, one in the evening with an additional 50/200 if the evening dose is really close to bedtime, as he has been going to bed at somewhere between 6 PM and 7 PM.  PREVIOUS MEDICATIONS: Sinemet, Sinemet CR, Mirapex, Requip and Eldpryl  ALLERGIES:   Allergies  Allergen Reactions  . Sulfa Antibiotics Hives  . Tylenol [Acetaminophen] Itching    CURRENT MEDICATIONS:  Current Outpatient Prescriptions on File Prior to Visit  Medication Sig Dispense Refill  . acyclovir (ZOVIRAX) 400 MG tablet Take 400 mg by mouth 2 (two) times daily.     . carbidopa-levodopa (SINEMET CR) 50-200 MG per tablet Take 1 tablet by mouth 3 (three) times daily.     . carbidopa-levodopa (SINEMET IR) 25-100 MG per tablet Take 2 tablets by mouth 3 (three) times daily. 2 tablets in the morning, 1 tablet in the afternoon, 2 tablets at dinner 180 tablet 2  . CIALIS 20 MG tablet     . clonazePAM (KLONOPIN) 0.5 MG tablet Take 1 tablet (0.5 mg total) by mouth at bedtime as needed for anxiety. 30 tablet 5  . cyclobenzaprine (FLEXERIL) 5 MG tablet TAKE 1 TABLET BY MOUTH EVERY NIGHT AS NEEDED FOR MUSCLE SPASM 30 tablet 0  . docusate sodium (COLACE) 100 MG capsule Take 100 mg by mouth 2 (two) times daily.    . modafinil (PROVIGIL) 200 MG tablet Take 200 mg by mouth daily.    . Multiple Vitamins-Minerals (IMMUNE SUPPORT VITAMIN C PO) Take by mouth.    . naproxen sodium (ANAPROX) 220 MG tablet Take 220 mg by mouth 2 (two) times daily with a meal.    . pantoprazole (PROTONIX) 40 MG tablet Take 40 mg by mouth daily.     . selegiline (ELDEPRYL) 5 MG capsule Take 5 mg by mouth 2 (two) times daily before a meal. 5mg  (1 capsule) in the morning and 5mg  (1 capsule) at noon    . terazosin (HYTRIN) 1 MG capsule Take 1 mg by  mouth daily.     No current facility-administered medications on file prior to visit.    PAST MEDICAL HISTORY:   Past Medical History  Diagnosis Date  . Parkinson disease   . Abdominal hernia   . Hypertension     PAST SURGICAL HISTORY:   Past Surgical History  Procedure Laterality Date  . Inguinal hernia repair Bilateral   . Vasectomy      SOCIAL HISTORY:   History   Social History  . Marital Status: Single    Spouse Name: N/A  Number of Children: N/A  . Years of Education: N/A   Occupational History  . Not on file.   Social History Main Topics  . Smoking status: Former Research scientist (life sciences)  . Smokeless tobacco: Not on file     Comment: during high school/college  . Alcohol Use: Yes     Comment: social   . Drug Use: Yes     Comment: marijuana occ  . Sexual Activity: Not on file   Other Topics Concern  . Not on file   Social History Narrative    FAMILY HISTORY:   Family Status  Relation Status Death Age  . Mother Deceased     MS  . Father Deceased     heart disease, stroke  . Brother Deceased     banti's disease  . Sister Deceased     breast cancer  . Maternal Grandmother Deceased     pd    ROS:  A complete 10 system review of systems was obtained and was unremarkable apart from what is mentioned above.  PHYSICAL EXAMINATION:    VITALS:   Filed Vitals:   02/13/14 1338  BP: 150/88  Pulse: 80  Height: 5\' 9"  (1.753 m)  Weight: 150 lb 7 oz (68.238 kg)  SpO2: 98%    GEN:  The patient appears stated age and is in NAD. HEENT:  Normocephalic, atraumatic.  The mucous membranes are moist. The superficial temporal arteries are without ropiness or tenderness.  Neurological examination:  Orientation:   The patient is alert and oriented x3. Fund of knowledge is appropriate. Cranial nerves: There is good facial symmetry.  Pupils are equal round and reactive to light bilaterally. Fundoscopic exam reveals clear margins bilaterally. Extraocular muscles are  intact. The visual fields are full to confrontational testing. The speech is fluent and clear. Soft palate rises symmetrically and there is no tongue deviation. Hearing is intact to conversational tone. Sensation: Sensation is intact to light touch throughout Motor: Strength is 5/5 in the bilateral upper and lower extremities.   Shoulder shrug is equal and symmetric.  There is no pronator drift.  Movement examination: Tone: There is normal tone bilaterally. Abnormal movements: There is no tremor noted.  There is slight dyskinesia in the R leg Coordination:  There is minor decremation of RAM's on the LUE/LLE compared to the R. Gait and Station: The patient has no difficulty arising out of a deep-seated chair without the use of the hands. The patient's stride length is normal today.  ASSESSMENT/PLAN:  1.  Idiopathic Parkinsons disease  -diagnosed in 2009  -  He is having motor fluctuations and had dyskinesia today.  I do think that he is likely a good DBS candidate.  Unfortunately, we were scheduled to have a UPDRS motor on/off test today, but he took his levodopa and we cannot proceed with this today.  Will r/s  -spent much greater than 50% of 45 min visit in counseling discussing the risks and benefits of DBS.  We also discussed what DBS would help with and what it would not.  We discussed centers locally and not locally that due to surgery.  I offered him a second opinion at another center that does this surgery.  He really does not want that right now.  -We will continue the patient's levodopa, 2 tablets 3 times per day in addition to the carbidopa/levodopa 50/200 at night.  -awaiting the results of his neuropsych testing. 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 4,  Constipation  -copy of  the rancho recipe give. 5.  Mild depression.  -mostly related to his living situation.  Encouraged him to try and fix that before DBS surgery, if it is really affecting him mentally.

## 2014-02-26 ENCOUNTER — Telehealth: Payer: Self-pay | Admitting: Neurology

## 2014-02-26 DIAGNOSIS — F32A Depression, unspecified: Secondary | ICD-10-CM

## 2014-02-26 DIAGNOSIS — F329 Major depressive disorder, single episode, unspecified: Secondary | ICD-10-CM

## 2014-02-26 NOTE — Telephone Encounter (Signed)
Pt called wanting to f/u on the DBS. Please call pt # [phone]

## 2014-02-26 NOTE — Telephone Encounter (Signed)
Referral placed and patient given the number [phone] to schedule an appt with Dr Cheryln Manly.

## 2014-02-26 NOTE — Telephone Encounter (Signed)
Spoke with patient. He understands and wants to see a psychologist. He is okay with coming to [facility] to see someone. Do you have a recommendation for him?

## 2014-02-26 NOTE — Telephone Encounter (Signed)
Please call pt.  Tell him I got neuropsych testing back and they did not recommend DBS until he underwent referral to psychology for treatment of depression (behavioral therapy) and follow through on financial planning.  He also lacks social support (he and I discussed this last visit and talked about him getting support from his cousin and wife as his cousin has a similar disease) and needs support before going through DBS therapy.  Ask if he wants a referral or, if not too far, could go back to [place] for that if he is willing?  If not, we could see if there is someone close to him in [place] area but I am less familiar with those resources.

## 2014-02-26 NOTE — Telephone Encounter (Signed)
Refer to [facility].  Send a copy of the neuropsych testing with it

## 2014-03-07 ENCOUNTER — Ambulatory Visit (INDEPENDENT_AMBULATORY_CARE_PROVIDER_SITE_OTHER): Payer: 59 | Admitting: Psychiatry

## 2014-03-07 ENCOUNTER — Telehealth: Payer: Self-pay | Admitting: Neurology

## 2014-03-07 DIAGNOSIS — F0630 Mood disorder due to known physiological condition, unspecified: Secondary | ICD-10-CM

## 2014-03-07 NOTE — Telephone Encounter (Signed)
Lucas Edwards from social services Adult protective service is calling and needs to speak with you about patient please call [phone]. She would like to speak to Dr Tat

## 2014-03-07 NOTE — Telephone Encounter (Signed)
I don't think I am allowed to talk to them about patient.  Lucas Edwards- please advise.

## 2014-03-07 NOTE — Telephone Encounter (Signed)
Let me run this by Sherlon Handing with [employer] and get her thoughts. I'll let you know what I find out / [facility]

## 2014-03-08 ENCOUNTER — Encounter: Payer: Self-pay | Admitting: Neurology

## 2014-03-08 ENCOUNTER — Ambulatory Visit (INDEPENDENT_AMBULATORY_CARE_PROVIDER_SITE_OTHER): Payer: 59 | Admitting: Neurology

## 2014-03-08 VITALS — BP 142/80 | HR 92 | Ht 69.0 in | Wt 150.0 lb

## 2014-03-08 DIAGNOSIS — G4752 REM sleep behavior disorder: Secondary | ICD-10-CM

## 2014-03-08 DIAGNOSIS — F32A Depression, unspecified: Secondary | ICD-10-CM

## 2014-03-08 DIAGNOSIS — G20 Parkinson's disease: Secondary | ICD-10-CM

## 2014-03-08 DIAGNOSIS — F329 Major depressive disorder, single episode, unspecified: Secondary | ICD-10-CM

## 2014-03-08 MED ORDER — ENTACAPONE 200 MG PO TABS: 200.0000 mg | ORAL_TABLET | Freq: Two times a day (BID) | ORAL | Status: DC
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
## 2014-03-09 NOTE — Telephone Encounter (Signed)
LM w/ Otilio Connors to fax a note on letterhead providing patient's name, case number and what type of information she needs our office to provide (general description). Once we have recv'd this I will review it and forward it to Dr. Carles Collet / Venida Jarvis .   _______________________________________________________________________________________________  Per [place] with HIM at [facility] -   First you should request for them to send you something in writing, this is to make sure we are not releasing information to an imposter.  Once you have received the letter check to make sure it has a case number on it, fact check the phone numbers they provide you.  You can do this using the internet, (i.e.:  Y[employer] Pages.[employer]).    If everything checks out Dr. Carles Collet can call them.  Dr. Carles Collet should only answer the questions they ask, sticking to the facts only, and not offering any other information or opinions (no dissertations).   Scan the letter they send under Releases.   If Dr. Carles Collet has any other questions before calling them, she can contact [place] in the legal department at [phone].

## 2014-03-10 ENCOUNTER — Other Ambulatory Visit: Payer: Self-pay | Admitting: Neurology

## 2014-03-12 NOTE — Telephone Encounter (Signed)
Carbidopa Levodopa 25/100 refill requested. Per last office note- patient to remain on medication. Refill approved and sent to patient's pharmacy.   

## 2014-04-16 ENCOUNTER — Ambulatory Visit (INDEPENDENT_AMBULATORY_CARE_PROVIDER_SITE_OTHER): Payer: 59 | Admitting: Neurology

## 2014-04-16 ENCOUNTER — Encounter: Payer: Self-pay | Admitting: Neurology

## 2014-04-16 VITALS — BP 112/64 | HR 92 | Ht 69.0 in | Wt 152.0 lb

## 2014-04-16 DIAGNOSIS — F329 Major depressive disorder, single episode, unspecified: Secondary | ICD-10-CM

## 2014-04-16 DIAGNOSIS — F32A Depression, unspecified: Secondary | ICD-10-CM

## 2014-04-16 DIAGNOSIS — G4752 REM sleep behavior disorder: Secondary | ICD-10-CM

## 2014-04-16 DIAGNOSIS — G20 Parkinson's disease: Secondary | ICD-10-CM

## 2014-04-16 NOTE — Progress Notes (Signed)
Lucas Edwards was seen today in the movement disorders clinic for neurologic consultation at the request of [name].    He has seen multiple other neurologists.  I think I have reviewed all of his other prior neurology records, which I will summarize.  The patient was first seen and diagnosed with Parkinson's disease by [name] in September, 2009 after presenting to her with left hand resting tremor.  He was started on selegiline.  One month later, he followed up and was started on Requip in October, 2009.  He felt that Requip caused diarrhea and then changed providers to [name], also at [facility] neurology at the time.  He was changed to Mirapex in December, 2009.  He followed up in February, 2010 and was very drowsy.  It was felt that this could be secondary to Mirapex.  In June, 2010 he began to have significant orthostatic hypotension and patient states that cutting back terazosin helped with this.  In July, 2010 he had a nocturnal polysomnogram because of the excessive daytime hypersomnolence which was essentially unremarkable.  He changed Mirapex to Mirapex ER to see if that would help the EDS, but he was still sleepy and his insurance would not approve the Mirapex ER, so he went back to the traditional form.  In March, 2011 he had another nocturnal polysomnogram, followed this time by an MSLT.  The nocturnal polysomnogram was once again normal and the MSLT did not demonstrate any sleep onset REM.  He was diagnosed with idiopathic hypersomnolence.  A mean sleep latency was 4.5 minutes, but 0/5 naps had achieved REM sleep.  He was placed on Nuvigil but has recently changed to provigil.  Ultimately, he transitioned care to [name].  [name] just recently saw the patient on 10/11/2013.  Because of hypersomnolence, the patient is splitting the carbidopa/levodopa 50/200 and not taking the immediate release formulation.  However, he states today that he splits it because of dyskinesia  when taking a full pill.  He remains on the Mirapex.  11/15/13 update:  Pt returns for f/u.  Last visit, I had him hold his mirapex because of EDS.  He called and c/o worsening sx's off the mirapex and was told by my partner to go back on the mirapex once per day (was on it tid) but he didn't do that and states today that he doesn't remember that conversation.  I was out of the office when he called.  He is no longer sleepy and in fact, is exactly the opposite.  He has such insomnia and is so stiff that he is frustrated by how bad he feels.  He is most frustrated by the insomnia.  He is on 0.25 mg of klonopin.  Has been holding the provigil.  Is on carbidopa/levodopa 50/200 tid and last took it at 6:30am and was seen today at 8:15 am.  Also c/o constipation  12/13/13 update:  The patient returns today for followup.  He was having sleep attacks with Mirapex.  This has resolved off of mirapex.    Last visit, I changed him to carbidopa/levodopa immediate release and he is currently on 25/100, 2 tablets in the morning, one in the afternoon and 2 in the evening with an additional 50/200 at nighttime.  I discontinued his selegiline because of insomnia. He is sleeping at night but has trouble rolling over.  He thinks that he needs more levodopa at lunch.   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  His insomnia is better.   He is looking at alternative living options such as independent assisted living.  He is very much considering DBS therapy.  He went to a lecture at [facility]: DBS.   02/13/14 update:  Pt returns today for UPDRS on/off testing as he wishes to proceed with possible DBS therapy if he is a candidate.  Unfortunately, he forgot and took his levodopa this morning.  He has had neuropsych testing done but results won't be back until next Wednesday.   He admits to some depression regarding the PD and also regarding living situation; he has some  family living with him and that isn't a great situation for him.  Feels that he has no support in regards to the PD.he continues to take B/levodopa either 2 tablets 3 times per day or 2 tablets in the morning, 2 in the afternoon, one in the evening with an additional 50/200 if the evening dose is really close to bedtime, as he has been going to bed at somewhere between 6 PM and 7 PM.  03/08/14 update:  Pt returns for follow up.  We cancelled his on/off testing as his neuropsych testing suggested that from a psych standpoint he wasn't a good candidate.  He did well from a memory standpoint.  There was major concern that he was getting taken advantage of by his brother in law (a heroin addict) and his brother in laws daughter, who is living on his property.  Pt doesn't know how to get out of that situation but he is going to [employer] medicine.  There was also concern that he lacked social support and therefore the patient has rekindled a friendship that he feels he can rely on.  The neuropsychologist also expressed concerns about high risk sexual behavior but apparently that improved since 2012 (and pt is off mirapex now if that had anything to do with it).   He is off of levodopa today and has been off since 8 pm last night.  He continues to take carbidopa/levodopa 25/100 2 tablets in the morning, 2 in the afternoon, one in the evening with an additional 50/200 if the evening dose is really close to bedtime.  "If I get to the point of dyskinesia, I feel great."  It wears off about an hour before next dose.  No falls  04/16/14 update:  Pt is f/u sooner than expected.  States that work approached him and told him that he needed to seek disability and no longer wanted him to work there and first disability day is to start on Friday.  Pt is worried about coverage for medical care.  Pt no longer able to climb ladders at work and is no longer able to drive to different sites for work.  He has a lot of trouble  getting in and out of the car.  He is no longer as fast at his job as he used to be and thinks that must be the primary issue.  He has not been back to the counselor but did have his brother in laws daughter sent to jail for embezzlement but she is back now and living on his property.  No falls.  No hallucinations.  I started Comtan last visit and added that to the first 2 dosages of his levodopa, and it turns out that he has been paying for this out of pocket.  He is taking  it, however.  It may help.  He is currently taking carbidopa/levodopa 25/100, 2 tablets in the morning with a Comtan, 2 tablets in the afternoon with the Comtan, one in the evening and an additional carbidopa/levodopa 50/200 at bedtime.  PREVIOUS MEDICATIONS: Sinemet, Sinemet CR, Mirapex, Requip and Eldpryl  ALLERGIES:   Allergies  Allergen Reactions  . Sulfa Antibiotics Hives  . Tylenol [Acetaminophen] Itching    CURRENT MEDICATIONS:  Current Outpatient Prescriptions on File Prior to Visit  Medication Sig Dispense Refill  . acyclovir (ZOVIRAX) 400 MG tablet Take 400 mg by mouth 2 (two) times daily.     . carbidopa-levodopa (SINEMET CR) 50-200 MG per tablet Take 1 tablet by mouth at bedtime.     . carbidopa-levodopa (SINEMET IR) 25-100 MG per tablet Take two tablets in the morning, 2 tablets in the afternoon and 1 tablet in the evening 150 tablet 5  . CIALIS 20 MG tablet     . clonazePAM (KLONOPIN) 0.5 MG tablet Take 1 tablet (0.5 mg total) by mouth at bedtime as needed for anxiety. 30 tablet 5  . cyclobenzaprine (FLEXERIL) 5 MG tablet TAKE 1 TABLET BY MOUTH EVERY NIGHT AS NEEDED FOR MUSCLE SPASM 30 tablet 0  . docusate sodium (COLACE) 100 MG capsule Take 100 mg by mouth 2 (two) times daily.    . entacapone (COMTAN) 200 MG tablet Take 1 tablet (200 mg total) by mouth 2 (two) times daily. 60 tablet 5  . ibuprofen (ADVIL,MOTRIN) 600 MG tablet   0  . modafinil (PROVIGIL) 200 MG tablet Take 200 mg by mouth daily.    .  Multiple Vitamins-Minerals (IMMUNE SUPPORT VITAMIN C PO) Take by mouth.    . naproxen sodium (ANAPROX) 220 MG tablet Take 220 mg by mouth 2 (two) times daily with a meal.    . pantoprazole (PROTONIX) 40 MG tablet Take 40 mg by mouth daily.     . selegiline (ELDEPRYL) 5 MG capsule Take 5 mg by mouth 2 (two) times daily before a meal. 5mg  (1 capsule) in the morning and 5mg  (1 capsule) at noon    . terazosin (HYTRIN) 1 MG capsule Take 1 mg by mouth daily.     No current facility-administered medications on file prior to visit.    PAST MEDICAL HISTORY:   Past Medical History  Diagnosis Date  . Parkinson disease   . Abdominal hernia   . Hypertension     PAST SURGICAL HISTORY:   Past Surgical History  Procedure Laterality Date  . Inguinal hernia repair Bilateral   . Vasectomy      SOCIAL HISTORY:   History   Social History  . Marital Status: Single    Spouse Name: N/A    Number of Children: N/A  . Years of Education: N/A   Occupational History  . Not on file.   Social History Main Topics  . Smoking status: Former Research scientist (life sciences)  . Smokeless tobacco: Not on file     Comment: during high school/college  . Alcohol Use: Yes     Comment: social   . Drug Use: Yes     Comment: marijuana occ  . Sexual Activity: Not on file   Other Topics Concern  . Not on file   Social History Narrative    FAMILY HISTORY:   Family Status  Relation Status Death Age  . Mother Deceased     MS  . Father Deceased     heart disease, stroke  . Brother Deceased  banti's disease  . Sister Deceased     breast cancer  . Maternal Grandmother Deceased     pd    ROS:  A complete 10 system review of systems was obtained and was unremarkable apart from what is mentioned above.  PHYSICAL EXAMINATION:    VITALS:   Filed Vitals:   04/16/14 1418  BP: 112/64  Pulse: 92  Height: 5\' 9"  (1.753 m)  Weight: 152 lb (68.947 kg)    GEN:  The patient appears stated age and is in NAD.  Much more  animated today and laughing.  Upbeat.   HEENT:  Normocephalic, atraumatic.  The mucous membranes are moist. The superficial temporal arteries are without ropiness or tenderness.  Neurological examination:  Orientation:   The patient is alert and oriented x3. Fund of knowledge is appropriate. Cranial nerves: There is good facial symmetry.  Pupils are equal round and reactive to light bilaterally. Fundoscopic exam reveals clear margins bilaterally. Extraocular muscles are intact. The visual fields are full to confrontational testing. The speech is fluent and clear. Soft palate rises symmetrically and there is no tongue deviation. Hearing is intact to conversational tone. Sensation: Sensation is intact to light touch throughout Motor: Strength is 5/5 in the bilateral upper and lower extremities.   Shoulder shrug is equal and symmetric.  There is no pronator drift.  The patient had not taken his levodopa in over 4 hours prior to his examination today:  Movement examination: Tone: There is moderate to severe rigidity on the left and moderate on the right.  The lower extremities are more rigid than the upper today. Abnormal movements: No tremor noted today. Coordination:  There is significant decremation with former rapid alternating movements today. Gait and Station: The patient has no significant difficulty arising out of the chair.  He has decreased arm swing bilaterally.  He turns en bloc.  ASSESSMENT/PLAN:  1.  Idiopathic Parkinsons disease  -diagnosed in 2009  -  He is having motor fluctuations and had dyskinesia in the past.  He is very rigid today because he has missed dosages of levodopa, but I did have him take them in the office today.  Holding on DBS because of psychiatric sx's of lacking social support, impulsive spending, getting taken advantage of by family living on property.  He has not been back to his counselor more than one time and I asked him to make that appointment.  Pt  needs to secure finances, work with Dr. Ferdinand Lango to address anxiety/depression issues and continue to find social support before DBS can be done.  Once those are addressed, will proceed with on/off testing.  -The patient's job asked him to leave because they felt that his Parkinson's disease made him too slow and prevented him from doing his job.  The patient cannot work at this time.  -We will continue the patient's levodopa, 2 tablets in the morning, 2 in the afternoon, one in the evening with an additional 50/200  -Continue comtan 200mg  with the first 2 dosages of levodopa.  I will try to get prior authorization, as I understand that the patient is paying out of pocket for this very expensive medication.  Risks, benefits, side effects and alternative therapies were discussed.  The opportunity to ask questions was given and they were answered to the best of my ability.  The patient expressed understanding and willingness to follow the outlined treatment protocols. 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 4,  Constipation  -copy of the rancho  recipe given. 5.  Depression.  -As above, the patient needs to make an appointment again with Dr. Ferdinand Lango.  He is not suicidal or homicidal, but is very worried about finances and insurance given the fact that he is no longer working as of the end of this week. 6.  I will plan on seeing the patient back in the next few months, sooner should new neurologic issues arise.

## 2014-04-17 ENCOUNTER — Telehealth: Payer: Self-pay | Admitting: Neurology

## 2014-04-17 NOTE — Telephone Encounter (Signed)
Left message on machine for patient to call back. To let him know Comtan is covered by insurance. It sounded like he has a prescription copay for his medication in January because his Comtan was $5.00 in December and was the first medication he filled in January. Awaiting call back to make him aware this should be cheaper next month.

## 2014-04-19 DIAGNOSIS — Z0279 Encounter for issue of other medical certificate: Secondary | ICD-10-CM

## 2014-04-19 NOTE — Telephone Encounter (Signed)
Patient called back and he was made aware.

## 2014-04-24 DIAGNOSIS — Z0279 Encounter for issue of other medical certificate: Secondary | ICD-10-CM

## 2014-05-12 ENCOUNTER — Other Ambulatory Visit: Payer: Self-pay | Admitting: Neurology

## 2014-05-14 NOTE — Telephone Encounter (Signed)
Clonazepam refill requested. Per last office note- patient to remain on medication. Refill approved and sent to patient's pharmacy.   

## 2014-05-24 DIAGNOSIS — Z66 Do not resuscitate: Secondary | ICD-10-CM | POA: Insufficient documentation

## 2014-06-07 ENCOUNTER — Ambulatory Visit: Payer: Self-pay | Admitting: Neurology

## 2014-07-12 ENCOUNTER — Encounter: Payer: Self-pay | Admitting: Neurology

## 2014-07-12 ENCOUNTER — Ambulatory Visit (INDEPENDENT_AMBULATORY_CARE_PROVIDER_SITE_OTHER): Payer: 59 | Admitting: Neurology

## 2014-07-12 VITALS — BP 160/90 | HR 92 | Ht 69.0 in | Wt 145.0 lb

## 2014-07-12 DIAGNOSIS — G20 Parkinson's disease: Secondary | ICD-10-CM

## 2014-07-12 MED ORDER — ENTACAPONE 200 MG PO TABS: 200.0000 mg | ORAL_TABLET | Freq: Three times a day (TID) | ORAL | Status: DC

## 2014-07-12 MED ORDER — CARBIDOPA-LEVODOPA 25-100 MG PO TABS: 2.0000 | ORAL_TABLET | Freq: Four times a day (QID) | ORAL | Status: DC

## 2014-07-12 NOTE — Patient Instructions (Addendum)
Constipation and Parkinson's disease:  1.Rancho recipe for constipation in Parkinsons Disease:  -1 cup of bran, 2 cups of applesauce in 1 cup of prune juice 2.  Increase fiber intake (Metamucil,vegetables) 3.  Regular, moderate exercise can be beneficial. 4.  Avoid medications causing constipation, such as medications like antacids with calcium or magnesium 5.  Laxative overuse should be avoided. 6.  Stool softeners (Colace) can help with chronic constipation.  Take your Parkinsons medications as follows: At 8am/11am/and 2-3pm you will take: carbidopa/levodopa 25/100, 2 tablets along with comtan (entacapone), 1 tablet At 5-6 pm, you will take: carbidopa/levodopa 25/100, 2 tablets At 8pm, you will take: carbidopa/levodopa 50/200 at bedtime

## 2014-07-12 NOTE — Progress Notes (Signed)
Lucas Edwards was seen today in the movement disorders clinic for neurologic consultation at the request of Dr. Melrose Edwards.    He has seen multiple other neurologists.  I think I have reviewed all of his other prior neurology records, which I will summarize.  The patient was first seen and diagnosed with Parkinson's disease by Dr. Krista Edwards in September, 2009 after presenting to her with left hand resting tremor.  He was started on selegiline.  One month later, he followed up and was started on Requip in October, 2009.  He felt that Requip caused diarrhea and then changed providers to Dr. Elvia Edwards, also at [facility] neurology at the time.  He was changed to Mirapex in December, 2009.  He followed up in February, 2010 and was very drowsy.  It was felt that this could be secondary to Mirapex.  In June, 2010 he began to have significant orthostatic hypotension and patient states that cutting back terazosin helped with this.  In July, 2010 he had a nocturnal polysomnogram because of the excessive daytime hypersomnolence which was essentially unremarkable.  He changed Mirapex to Mirapex ER to see if that would help the EDS, but he was still sleepy and his insurance would not approve the Mirapex ER, so he went back to the traditional form.  In March, 2011 he had another nocturnal polysomnogram, followed this time by an MSLT.  The nocturnal polysomnogram was once again normal and the MSLT did not demonstrate any sleep onset REM.  He was diagnosed with idiopathic hypersomnolence.  A mean sleep latency was 4.5 minutes, but 0/5 naps had achieved REM sleep.  He was placed on Nuvigil but has recently changed to provigil.  Ultimately, he transitioned care to Dr. Melrose Edwards.  Dr. Melrose Edwards just recently saw the patient on 10/11/2013.  Because of hypersomnolence, the patient is splitting the carbidopa/levodopa 50/200 and not taking the immediate release formulation.  However, he states today that he splits it because of dyskinesia  when taking a full pill.  He remains on the Mirapex.  11/15/13 update:  Pt returns for f/u.  Last visit, I had him hold his mirapex because of EDS.  He called and c/o worsening sx's off the mirapex and was told by my partner to go back on the mirapex once per day (was on it tid) but he didn't do that and states today that he doesn't remember that conversation.  I was out of the office when he called.  He is no longer sleepy and in fact, is exactly the opposite.  He has such insomnia and is so stiff that he is frustrated by how bad he feels.  He is most frustrated by the insomnia.  He is on 0.25 mg of klonopin.  Has been holding the provigil.  Is on carbidopa/levodopa 50/200 tid and last took it at 6:30am and was seen today at 8:15 am.  Also c/o constipation  12/13/13 update:  The patient returns today for followup.  He was having sleep attacks with Mirapex.  This has resolved off of mirapex.    Last visit, I changed him to carbidopa/levodopa immediate release and he is currently on 25/100, 2 tablets in the morning, one in the afternoon and 2 in the evening with an additional 50/200 at nighttime.  I discontinued his selegiline because of insomnia. He is sleeping at night but has trouble rolling over.  He thinks that he needs more levodopa at lunch.   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  His insomnia is better.   He is looking at alternative living options such as independent assisted living.  He is very much considering DBS therapy.  He went to a lecture at [facility]: DBS.   02/13/14 update:  Pt returns today for UPDRS on/off testing as he wishes to proceed with possible DBS therapy if he is a candidate.  Unfortunately, he forgot and took his levodopa this morning.  He has had neuropsych testing done but results won't be back until next Wednesday.   He admits to some depression regarding the PD and also regarding living situation; he has some  family living with him and that isn't a great situation for him.  Feels that he has no support in regards to the PD.he continues to take B/levodopa either 2 tablets 3 times per day or 2 tablets in the morning, 2 in the afternoon, one in the evening with an additional 50/200 if the evening dose is really close to bedtime, as he has been going to bed at somewhere between 6 PM and 7 PM.  03/08/14 update:  Pt returns for follow up.  We cancelled his on/off testing as his neuropsych testing suggested that from a psych standpoint he wasn't a good candidate.  He did well from a memory standpoint.  There was major concern that he was getting taken advantage of by his brother in law (a heroin addict) and his brother in laws daughter, who is living on his property.  Pt doesn't know how to get out of that situation but he is going to [employer] medicine.  There was also concern that he lacked social support and therefore the patient has rekindled a friendship that he feels he can rely on.  The neuropsychologist also expressed concerns about high risk sexual behavior but apparently that improved since 2012 (and pt is off mirapex now if that had anything to do with it).   He is off of levodopa today and has been off since 8 pm last night.  He continues to take carbidopa/levodopa 25/100 2 tablets in the morning, 2 in the afternoon, one in the evening with an additional 50/200 if the evening dose is really close to bedtime.  "If I get to the point of dyskinesia, I feel great."  It wears off about an hour before next dose.  No falls  04/16/14 update:  Pt is f/u sooner than expected.  States that work approached him and told him that he needed to seek disability and no longer wanted him to work there and first disability day is to start on Friday.  Pt is worried about coverage for medical care.  Pt no longer able to climb ladders at work and is no longer able to drive to different sites for work.  He has a lot of trouble  getting in and out of the car.  He is no longer as fast at his job as he used to be and thinks that must be the primary issue.  He has not been back to the counselor but did have his brother in laws daughter sent to jail for embezzlement but she is back now and living on his property.  No falls.  No hallucinations.  I started Comtan last visit and added that to the first 2 dosages of his levodopa, and it turns out that he has been paying for this out of pocket.  He is taking  it, however.  It may help.  He is currently taking carbidopa/levodopa 25/100, 2 tablets in the morning with a Comtan, 2 tablets in the afternoon with the Comtan, one in the evening and an additional carbidopa/levodopa 50/200 at bedtime.  07/12/14 update:  When I last left the patient, he was on carbidopa/levodopa 25/100 2 tablets in the morning along with an entacapone, 2 tablets in the afternoon along with an entacapone, one in the evening along with an entacapone and then he was to take a carbidopa/levodopa 50/200 CR at bedtime.   This however has been changed by his neurologist in [place] since last visit, whom he saw only 2 days after our last visit.  I reviewed those records.  He now takes his Parkinson's medication as follows:  At 8 AM he takes a carbidopa/levodopa 25/100 and a carbidopa/levodopa 50/200.  At noon he takes 2 tablets of carbidopa/levodopa 25/100 and an entacapone.  At 5 PM he takes 2 tablets of carbidopa/levodopa 25/100 and entacapone.  At 8 PM, which is bedtime, he takes carbidopa/levodopa 50/200, clonazepam and Flexeril.  He states that he does fairly well in the morning, but by 5 PM he can hardly move because of rigidity.  He has dizzy spells and very low blood pressure.  He does state that he has an appointment with DSS on Monday to continue to work out some of his finances.  He bought a mobile home for his stepdaughter off of his property and that has been beneficial to him.  He has not been back to the counselor,  but thinks that getting these other things in order is really what he needed to do.    PREVIOUS MEDICATIONS: Sinemet, Sinemet CR, Mirapex, Requip and Eldpryl  ALLERGIES:   Allergies  Allergen Reactions  . Sulfa Antibiotics Hives  . Tylenol [Acetaminophen] Itching    CURRENT MEDICATIONS:  Current Outpatient Prescriptions on File Prior to Visit  Medication Sig Dispense Refill  . acyclovir (ZOVIRAX) 400 MG tablet Take 400 mg by mouth 2 (two) times daily.     . carbidopa-levodopa (SINEMET CR) 50-200 MG per tablet Take 1 tablet by mouth at bedtime.     . carbidopa-levodopa (SINEMET IR) 25-100 MG per tablet Take two tablets in the morning, 2 tablets in the afternoon and 1 tablet in the evening 150 tablet 5  . CIALIS 20 MG tablet     . clonazePAM (KLONOPIN) 0.5 MG tablet TAKE 1 TABLET BY MOUTH AT BEDTIME 30 tablet 5  . cyclobenzaprine (FLEXERIL) 5 MG tablet TAKE 1 TABLET BY MOUTH EVERY NIGHT AS NEEDED FOR MUSCLE SPASM 30 tablet 0  . docusate sodium (COLACE) 100 MG capsule Take 100 mg by mouth 2 (two) times daily.    . entacapone (COMTAN) 200 MG tablet Take 1 tablet (200 mg total) by mouth 2 (two) times daily. 60 tablet 5  . ibuprofen (ADVIL,MOTRIN) 600 MG tablet   0  . modafinil (PROVIGIL) 200 MG tablet Take 200 mg by mouth daily.    . naproxen sodium (ANAPROX) 220 MG tablet Take 220 mg by mouth 2 (two) times daily with a meal.    . pantoprazole (PROTONIX) 40 MG tablet Take 40 mg by mouth daily.     Marland Kitchen terazosin (HYTRIN) 1 MG capsule Take 1 mg by mouth daily.     No current facility-administered medications on file prior to visit.    PAST MEDICAL HISTORY:   Past Medical History  Diagnosis Date  . Parkinson disease   .  Abdominal hernia   . Hypertension     PAST SURGICAL HISTORY:   Past Surgical History  Procedure Laterality Date  . Inguinal hernia repair Bilateral   . Vasectomy      SOCIAL HISTORY:   History   Social History  . Marital Status: Single    Spouse Name: N/A  .  Number of Children: N/A  . Years of Education: N/A   Occupational History  . Not on file.   Social History Main Topics  . Smoking status: Former Research scientist (life sciences)  . Smokeless tobacco: Not on file     Comment: during high school/college  . Alcohol Use: Yes     Comment: social   . Drug Use: Yes     Comment: marijuana occ  . Sexual Activity: Not on file   Other Topics Concern  . Not on file   Social History Narrative    FAMILY HISTORY:   Family Status  Relation Status Death Age  . Mother Deceased     MS  . Father Deceased     heart disease, stroke  . Brother Deceased     banti's disease  . Sister Deceased     breast cancer  . Maternal Grandmother Deceased     pd    ROS:  A complete 10 system review of systems was obtained and was unremarkable apart from what is mentioned above.  PHYSICAL EXAMINATION:    VITALS:   Filed Vitals:   07/12/14 1115  BP: 160/90  Pulse: 92  Height: 5\' 9"  (1.753 m)  Weight: 145 lb (65.772 kg)    GEN:  The patient appears stated age and is in NAD.  Much more animated today and laughing.  Upbeat.   HEENT:  Normocephalic, atraumatic.  The mucous membranes are moist. The superficial temporal arteries are without ropiness or tenderness.  Neurological examination:  Orientation:   The patient is alert and oriented x3. Fund of knowledge is appropriate. Cranial nerves: There is good facial symmetry.  Pupils are equal round and reactive to light bilaterally. Fundoscopic exam reveals clear margins bilaterally. Extraocular muscles are intact. The visual fields are full to confrontational testing. The speech is fluent and clear. Soft palate rises symmetrically and there is no tongue deviation. Hearing is intact to conversational tone. Sensation: Sensation is intact to light touch throughout Motor: Strength is 5/5 in the bilateral upper and lower extremities.   Shoulder shrug is equal and symmetric.  There is no pronator drift.  Movement examination: Tone:  There is mild increased tone in the left upper extremity and moderate increased tone in the left lower extremity.  There is normal tone on the right.   Abnormal movements: No tremor noted today.  There is mild to moderate dyskinesia. Coordination:  There is mild decremation with rapid alternating movements, particularly with heel taps and toe taps on the left. Gait and Station: The patient has no significant difficulty arising out of the chair.  He has decreased arm swing bilaterally.  He turns en bloc.  He has a negative pull test.  ASSESSMENT/PLAN:  1.  Idiopathic Parkinsons disease  -diagnosed in 2009  -  He is having motor fluctuations and dyskinesia.    -I will rearrange his dosing of his Parkinson's medications so that he takes them as follows: At 8 AM, 11 AM and 2:58 PM the patient will take an entacapone and 2 tablets of carbidopa/levodopa 25/100.  The patient will take another 2 tablets of carbidopa/levodopa 25/100 at 5:55 PM  and then at 8 PM, which is bedtime, the patient will take carbidopa/levodopa 50/200 along with his clonazepam.  -We again discussed Rytary, which is now on [place] healthcare's formulary, but this really is likely cost prohibitive for him. 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 4,  Constipation  -copy of the rancho recipe given.  Pt did not remember that so gave it again. 5.  Depression.  -He is trying to work on getting his social situation improved.  He is not suicidal or homicidal. 6.  I will plan on seeing the patient back in the next few months, sooner should new neurologic issues arise.

## 2014-09-04 ENCOUNTER — Encounter: Payer: Self-pay | Admitting: Emergency Medicine

## 2014-09-04 ENCOUNTER — Emergency Department
Admission: EM | Admit: 2014-09-04 | Discharge: 2014-09-04 | Disposition: A | Discharge status: Home / Self Care | Payer: 59 | Attending: Emergency Medicine | Admitting: Emergency Medicine

## 2014-09-04 DIAGNOSIS — Y9289 Other specified places as the place of occurrence of the external cause: Secondary | ICD-10-CM | POA: Insufficient documentation

## 2014-09-04 DIAGNOSIS — Z791 Long term (current) use of non-steroidal anti-inflammatories (NSAID): Secondary | ICD-10-CM | POA: Diagnosis not present

## 2014-09-04 DIAGNOSIS — S0502XA Injury of conjunctiva and corneal abrasion without foreign body, left eye, initial encounter: Secondary | ICD-10-CM | POA: Diagnosis not present

## 2014-09-04 DIAGNOSIS — Y998 Other external cause status: Secondary | ICD-10-CM | POA: Diagnosis not present

## 2014-09-04 DIAGNOSIS — Z79899 Other long term (current) drug therapy: Secondary | ICD-10-CM | POA: Diagnosis not present

## 2014-09-04 DIAGNOSIS — X58XXXA Exposure to other specified factors, initial encounter: Secondary | ICD-10-CM | POA: Diagnosis not present

## 2014-09-04 DIAGNOSIS — T1592XA Foreign body on external eye, part unspecified, left eye, initial encounter: Secondary | ICD-10-CM | POA: Diagnosis present

## 2014-09-04 DIAGNOSIS — I10 Essential (primary) hypertension: Secondary | ICD-10-CM | POA: Diagnosis not present

## 2014-09-04 DIAGNOSIS — Y93H2 Activity, gardening and landscaping: Secondary | ICD-10-CM | POA: Insufficient documentation

## 2014-09-04 DIAGNOSIS — Z87891 Personal history of nicotine dependence: Secondary | ICD-10-CM | POA: Diagnosis not present

## 2014-09-04 MED ORDER — CIPROFLOXACIN HCL 0.3 % OP SOLN: 2.0000 [drp] | Freq: Once | OPHTHALMIC | Status: DC

## 2014-09-04 MED ORDER — FLUORESCEIN SODIUM 1 MG OP STRP
ORAL_STRIP | OPHTHALMIC | Status: AC
  Filled 2014-09-04: qty 1

## 2014-09-04 MED ORDER — TETRACAINE HCL 0.5 % OP SOLN
OPHTHALMIC | Status: AC
  Filled 2014-09-04: qty 2

## 2014-09-04 MED ORDER — FLUORESCEIN SODIUM 1 MG OP STRP
ORAL_STRIP | OPHTHALMIC | Status: AC
  Administered 2014-09-04: 1 via OPHTHALMIC
  Filled 2014-09-04: qty 1

## 2014-09-04 MED ORDER — CIPROFLOXACIN HCL 0.3 % OP SOLN
2.0000 [drp] | Freq: Once | OPHTHALMIC | Status: AC
  Administered 2014-09-04: 2 [drp] via OPHTHALMIC

## 2014-09-04 MED ORDER — TETRACAINE HCL 0.5 % OP SOLN
OPHTHALMIC | Status: AC
Start: 2014-09-04 — End: 2014-09-04
  Administered 2014-09-04: 2 [drp] via OPHTHALMIC
  Filled 2014-09-04: qty 2

## 2014-09-04 MED ORDER — TETRACAINE HCL 0.5 % OP SOLN
2.0000 [drp] | Freq: Once | OPHTHALMIC | Status: AC
  Administered 2014-09-04: 2 [drp] via OPHTHALMIC

## 2014-09-04 MED ORDER — FLUORESCEIN SODIUM 1 MG OP STRP
1.0000 | ORAL_STRIP | Freq: Once | OPHTHALMIC | Status: AC
  Administered 2014-09-04: 1 via OPHTHALMIC

## 2014-09-04 MED ORDER — CIPROFLOXACIN HCL 0.3 % OP SOLN
OPHTHALMIC | Status: AC
  Filled 2014-09-04: qty 2.5

## 2014-09-04 NOTE — ED Notes (Signed)
Pt has scratches to left eye. Needing ABT eye drops

## 2014-09-04 NOTE — ED Provider Notes (Signed)
[facility] Emergency Department Provider Note  ____________________________________________  Time seen: 5:15 AM  I have reviewed the triage vital signs and the nursing notes.   HISTORY  Chief Complaint Foreign Body in Eye      HPI Lucas Edwards is a 64 y.o. male presents withleft eye pain for an body sensation times one day. Patient admits to recently mowing the lawn and having objects "fly into his eye".     Past Medical History  Diagnosis Date  . Parkinson disease   . Abdominal hernia   . Hypertension   . Parkinson disease     Patient Active Problem List   Diagnosis Date Noted  . Paralysis agitans 10/24/2013  . 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 Inguinal hernia repair Bilateral   . Vasectomy      Current Outpatient Rx  Name  Route  Sig  Dispense  Refill  . carbidopa-levodopa (SINEMET CR) 50-200 MG per tablet   Oral   Take 1 tablet by mouth 2 (two) times daily.          . carbidopa-levodopa (SINEMET IR) 25-100 MG per tablet   Oral   Take 2 tablets by mouth 4 (four) times daily. 2 po qid   240 tablet   5   . clonazePAM (KLONOPIN) 0.5 MG tablet      TAKE 1 TABLET BY MOUTH AT BEDTIME   30 tablet   5     This request is for a new prescription for a contr ...   . cyclobenzaprine (FLEXERIL) 5 MG tablet      TAKE 1 TABLET BY MOUTH EVERY NIGHT AS NEEDED FOR MUSCLE SPASM   30 tablet   0   . docusate sodium (COLACE) 100 MG capsule   Oral   Take 100 mg by mouth 3 (three) times daily.          . entacapone (COMTAN) 200 MG tablet   Oral   Take 1 tablet (200 mg total) by mouth 3 (three) times daily.   90 tablet   5   . modafinil (PROVIGIL) 200 MG tablet   Oral   Take 200 mg by mouth daily.         . naproxen sodium (ANAPROX) 220 MG tablet   Oral   Take 220 mg by mouth 2 (two) times daily with a meal.         . pantoprazole (PROTONIX) 40 MG tablet    Oral   Take 40 mg by mouth daily.          Marland Kitchen terazosin (HYTRIN) 1 MG capsule   Oral   Take 1 mg by mouth daily.           Allergies Sulfa antibiotics and Tylenol  History reviewed. No pertinent family history.  Social History History  Substance Use Topics  . Smoking status: Former Research scientist (life sciences)  . Smokeless tobacco: Not on file     Comment: during high school/college  . Alcohol Use: Yes     Comment: social     Review of Systems  Constitutional: Negative for fever. Eyes: Positive for left eye pain ENT: Negative for sore throat. Cardiovascular: Negative for chest pain. Respiratory: Negative for shortness of breath. Gastrointestinal: Negative for abdominal pain, vomiting and diarrhea. Genitourinary: Negative for dysuria. Musculoskeletal: Negative for back pain. Skin: Negative for rash. Neurological: Negative for headaches, focal weakness or numbness.   10-point ROS otherwise negative.  ____________________________________________   PHYSICAL EXAM:  VITAL SIGNS: ED Triage Vitals  Enc Vitals Group     BP 09/04/14 0214 116/71 mmHg     Pulse Rate 09/04/14 0214 79     Resp 09/04/14 0214 18     Temp 09/04/14 0214 98 F (36.7 C)     Temp src --      SpO2 09/04/14 0214 100 %     Weight 09/04/14 0214 140 lb (63.504 kg)     Height 09/04/14 0214 5\' 9"  (1.753 m)     Head Cir --      Peak Flow --      Pain Score 09/04/14 0215 5     Pain Loc --      Pain Edu? --      Excl. in [place]? --     Constitutional: Alert and oriented. Well appearing and in no distress. Eyes: Conjunctivae are normal. PERRL. Normal extraocular movements. Corneal abrasion at 9:00 position of the left eye ENT   Head: Normocephalic and atraumatic.   Nose: No congestion/rhinnorhea.   Mouth/Throat: Mucous membranes are moist.   Neck: No stridor. Cardiovascular: Normal rate, regular rhythm. Normal and symmetric distal pulses are present in all extremities. No murmurs, rubs, or  gallops. Respiratory: Normal respiratory effort without tachypnea nor retractions. Breath sounds are clear and equal bilaterally. No wheezes/rales/rhonchi. Gastrointestinal: Soft and nontender. No distention. There is no CVA tenderness. Genitourinary: deferred Musculoskeletal: Nontender with normal range of motion in all extremities. No joint effusions.  No lower extremity tenderness nor edema. Neurologic:  Normal speech and language. No gross focal neurologic deficits are appreciated. Speech is normal.  Skin:  Skin is warm, dry and intact. No rash noted. Psychiatric: Mood and affect are normal. Speech and behavior are normal. Patient exhibits appropriate insight and judgment.  ____________________________________________     INITIAL IMPRESSION / ASSESSMENT AND PLAN / ED COURSE  Pertinent labs & imaging results that were available during my care of the patient were reviewed by me and considered in my medical decision making (see chart for details).  History of physical exam consistent with corneal abrasion patient will receive Cipro ophthalmic drops  ____________________________________________   FINAL CLINICAL IMPRESSION(S) / ED DIAGNOSES  Final diagnoses:  Corneal abrasion, left, initial encounter      Gregor Hams, MD 09/05/14 2247

## 2014-09-04 NOTE — ED Notes (Signed)
MD at bedside. Using wood lamp.

## 2014-09-04 NOTE — Discharge Instructions (Signed)
Corneal Abrasion °The cornea is the clear covering at the front and center of the eye. When looking at the colored portion of the eye (iris), you are looking through the cornea. This very thin tissue is made up of many layers. The surface layer is a single layer of cells (corneal epithelium) and is one of the most sensitive tissues in the body. If a scratch or injury causes the corneal epithelium to come off, it is called a corneal abrasion. If the injury extends to the tissues below the epithelium, the condition is called a corneal ulcer. °CAUSES  °· Scratches. °· Trauma. °· Foreign body in the eye. °Some people have recurrences of abrasions in the area of the original injury even after it has healed (recurrent erosion syndrome). Recurrent erosion syndrome generally improves and goes away with time. °SYMPTOMS  °· Eye pain. °· Difficulty or inability to keep the injured eye open. °· The eye becomes very sensitive to light. °· Recurrent erosions tend to happen suddenly, first thing in the morning, usually after waking up and opening the eye. °DIAGNOSIS  °Your health care provider can diagnose a corneal abrasion during an eye exam. Dye is usually placed in the eye using a drop or a small paper strip moistened by your tears. When the eye is examined with a special light, the abrasion shows up clearly because of the dye. °TREATMENT  °· Small abrasions may be treated with antibiotic drops or ointment alone. °· A pressure patch may be put over the eye. If this is done, follow your doctor's instructions for when to remove the patch. Do not drive or use machines while the eye patch is on. Judging distances is hard to do with a patch on. °If the abrasion becomes infected and spreads to the deeper tissues of the cornea, a corneal ulcer can result. This is serious because it can cause corneal scarring. Corneal scars interfere with light passing through the cornea and cause a loss of vision in the involved eye. °HOME CARE  INSTRUCTIONS °· Use medicine or ointment as directed. Only take over-the-counter or prescription medicines for pain, discomfort, or fever as directed by your health care provider. °· Do not drive or operate machinery if your eye is patched. Your ability to judge distances is impaired. °· If your health care provider has given you a follow-up appointment, it is very important to keep that appointment. Not keeping the appointment could result in a severe eye infection or permanent loss of vision. If there is any problem keeping the appointment, let your health care provider know. °SEEK MEDICAL CARE IF:  °· You have pain, light sensitivity, and a scratchy feeling in one eye or both eyes. °· Your pressure patch keeps loosening up, and you can blink your eye under the patch after treatment. °· Any kind of discharge develops from the eye after treatment or if the lids stick together in the morning. °· You have the same symptoms in the morning as you did with the original abrasion days, weeks, or months after the abrasion healed. °MAKE SURE YOU:  °· Understand these instructions. °· Will watch your condition. °· Will get help right away if you are not doing well or get worse. °Document Released: 03/13/2000 Document Revised: 03/21/2013 Document Reviewed: 11/21/2012 °[employer]® Patient Information ©2015 [employer]. This information is not intended to replace advice given to you by your health care provider. Make sure you discuss any questions you have with your health care provider. ° °

## 2014-09-04 NOTE — ED Notes (Signed)
Pt arrived to the ED for complaints of a foreign body on his left eye. Pt reports not knowing what what fell on his eye. Pt tried to get it out without success. Pt is AOx4 in no apparent distress.

## 2014-10-02 ENCOUNTER — Ambulatory Visit (INDEPENDENT_AMBULATORY_CARE_PROVIDER_SITE_OTHER): Payer: 59 | Admitting: Neurology

## 2014-10-02 ENCOUNTER — Encounter: Payer: Self-pay | Admitting: Neurology

## 2014-10-02 ENCOUNTER — Telehealth: Payer: Self-pay | Admitting: Neurology

## 2014-10-02 VITALS — BP 92/64 | HR 72 | Ht 69.0 in | Wt 143.0 lb

## 2014-10-02 DIAGNOSIS — F32A Depression, unspecified: Secondary | ICD-10-CM

## 2014-10-02 DIAGNOSIS — G4752 REM sleep behavior disorder: Secondary | ICD-10-CM | POA: Diagnosis not present

## 2014-10-02 DIAGNOSIS — F329 Major depressive disorder, single episode, unspecified: Secondary | ICD-10-CM | POA: Diagnosis not present

## 2014-10-02 DIAGNOSIS — F320 Major depressive disorder, single episode, mild: Secondary | ICD-10-CM

## 2014-10-02 DIAGNOSIS — G249 Dystonia, unspecified: Secondary | ICD-10-CM | POA: Diagnosis not present

## 2014-10-02 DIAGNOSIS — G20 Parkinson's disease: Secondary | ICD-10-CM

## 2014-10-02 NOTE — Telephone Encounter (Signed)
Patient needs a letter in addition to his disability forms completed. He would like to pick up letter. Letter written and at the front for pick up.

## 2014-10-02 NOTE — Telephone Encounter (Signed)
Pt states that he needs a letter stating that he is not going to be able to return to work and that he is disable. Pt phone number is [phone]
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
## 2014-10-04 DIAGNOSIS — G20B2 Parkinson's disease with dyskinesia, with fluctuations: Secondary | ICD-10-CM | POA: Insufficient documentation

## 2014-10-04 DIAGNOSIS — G20 Parkinson's disease: Secondary | ICD-10-CM | POA: Insufficient documentation

## 2014-10-20 ENCOUNTER — Encounter: Payer: Self-pay | Admitting: Student

## 2014-10-20 ENCOUNTER — Other Ambulatory Visit: Payer: Self-pay

## 2014-10-20 ENCOUNTER — Emergency Department
Admission: EM | Admit: 2014-10-20 | Discharge: 2014-10-20 | Disposition: A | Discharge status: Home / Self Care | Payer: 59 | Attending: Emergency Medicine | Admitting: Emergency Medicine

## 2014-10-20 DIAGNOSIS — I10 Essential (primary) hypertension: Secondary | ICD-10-CM | POA: Insufficient documentation

## 2014-10-20 DIAGNOSIS — Z87891 Personal history of nicotine dependence: Secondary | ICD-10-CM | POA: Insufficient documentation

## 2014-10-20 DIAGNOSIS — I951 Orthostatic hypotension: Secondary | ICD-10-CM | POA: Insufficient documentation

## 2014-10-20 DIAGNOSIS — Z79899 Other long term (current) drug therapy: Secondary | ICD-10-CM | POA: Diagnosis not present

## 2014-10-20 DIAGNOSIS — R55 Syncope and collapse: Secondary | ICD-10-CM | POA: Diagnosis present

## 2014-10-20 LAB — CBC
HEMATOCRIT: 39.3 % — AB (ref 40.0–52.0)
Hemoglobin: 13.6 g/dL (ref 13.0–18.0)
MCH: 31.2 pg (ref 26.0–34.0)
MCHC: 34.6 g/dL (ref 32.0–36.0)
MCV: 90.2 fL (ref 80.0–100.0)
Platelets: 175 10*3/uL (ref 150–440)
RBC: 4.35 MIL/uL — ABNORMAL LOW (ref 4.40–5.90)
RDW: 13.1 % (ref 11.5–14.5)
WBC: 7.1 10*3/uL (ref 3.8–10.6)

## 2014-10-20 LAB — BASIC METABOLIC PANEL
ANION GAP: 7 (ref 5–15)
BUN: 23 mg/dL — ABNORMAL HIGH (ref 6–20)
CALCIUM: 8.9 mg/dL (ref 8.9–10.3)
CO2: 27 mmol/L (ref 22–32)
Chloride: 104 mmol/L (ref 101–111)
Creatinine, Ser: 1.09 mg/dL (ref 0.61–1.24)
GFR calc Af Amer: >60 mL/min (ref >60)
GFR calc non Af Amer: >60 mL/min (ref >60)
Glucose, Bld: 124 mg/dL — ABNORMAL HIGH (ref 65–99)
Potassium: 3.8 mmol/L (ref 3.5–5.1)
Sodium: 138 mmol/L (ref 135–145)

## 2014-10-20 MED ORDER — SODIUM CHLORIDE 0.9 % IV BOLUS (SEPSIS)
1000.0000 mL | Freq: Once | INTRAVENOUS | Status: AC
  Administered 2014-10-20: 1000 mL via INTRAVENOUS

## 2014-10-20 NOTE — ED Notes (Signed)
Pt reports he does not Know how long he was out for a while he states someone some people who say they saw him fall stopped to help him. Pt Blood pressure taken in triage ranging from elevated once then in the lower 70's on checked in both arms.

## 2014-10-20 NOTE — Discharge Instructions (Signed)
Vasovagal Syncope, Adult °Syncope, commonly known as fainting, is a temporary loss of consciousness. It occurs when the blood flow to the brain is reduced. Vasovagal syncope (also called neurocardiogenic syncope) is a fainting spell in which the blood flow to the brain is reduced because of a sudden drop in heart rate and blood pressure. Vasovagal syncope occurs when the brain and the cardiovascular system (blood vessels) do not adequately communicate and respond to each other. This is the most common cause of fainting. It often occurs in response to fear or some other type of emotional or physical stress. The body has a reaction in which the heart starts beating too slowly or the blood vessels expand, reducing blood pressure. This type of fainting spell is generally considered harmless. However, injuries can occur if a person takes a sudden fall during a fainting spell.  °CAUSES  °Vasovagal syncope occurs when a person's blood pressure and heart rate decrease suddenly, usually in response to a trigger. Many things and situations can trigger an episode. Some of these include:  °· Pain.   °· Fear.   °· The sight of blood or medical procedures, such as blood being drawn from a vein.   °· Common activities, such as coughing, swallowing, stretching, or going to the bathroom.   °· Emotional stress.   °· Prolonged standing, especially in a warm environment.   °· Lack of sleep or rest.   °· Prolonged lack of food.   °· Prolonged lack of fluids.   °· Recent illness. °· The use of certain drugs that affect blood pressure, such as cocaine, alcohol, marijuana, inhalants, and opiates.   °SYMPTOMS  °Before the fainting episode, you may:  °· Feel dizzy or light headed.   °· Become pale. °· Sense that you are going to faint.   °· Feel like the room is spinning.   °· Have tunnel vision, only seeing directly in front of you.   °· Feel sick to your stomach (nauseous).   °· See spots or slowly lose vision.   °· Hear ringing in your  ears.   °· Have a headache.   °· Feel warm and sweaty.   °· Feel a sensation of pins and needles. °During the fainting spell, you will generally be unconscious for no longer than a couple minutes before waking up and returning to normal. If you get up too quickly before your body can recover, you may faint again. Some twitching or jerky movements may occur during the fainting spell.  °DIAGNOSIS  °Your caregiver will ask about your symptoms, take a medical history, and perform a physical exam. Various tests may be done to rule out other causes of fainting. These may include blood tests and tests to check the heart, such as electrocardiography, echocardiography, and possibly an electrophysiology study. When other causes have been ruled out, a test may be done to check the body's response to changes in position (tilt table test). °TREATMENT  °Most cases of vasovagal syncope do not require treatment. Your caregiver may recommend ways to avoid fainting triggers and may provide home strategies for preventing fainting. If you must be exposed to a possible trigger, you can drink additional fluids to help reduce your chances of having an episode of vasovagal syncope. If you have warning signs of an oncoming episode, you can respond by positioning yourself favorably (lying down). °If your fainting spells continue, you may be given medicines to prevent fainting. Some medicines may help make you more resistant to repeated episodes of vasovagal syncope. Special exercises or compression stockings may be recommended. In rare cases, the surgical placement   of a pacemaker is considered. °HOME CARE INSTRUCTIONS  °· Learn to identify the warning signs of vasovagal syncope.   °· Sit or lie down at the first warning sign of a fainting spell. If sitting, put your head down between your legs. If you lie down, swing your legs up in the air to increase blood flow to the brain.   °· Avoid hot tubs and saunas. °· Avoid prolonged  standing. °· Drink enough fluids to keep your urine clear or pale yellow. Avoid caffeine. °· Increase salt in your diet as directed by your caregiver.   °· If you have to stand for a long time, perform movements such as:   °¨ Crossing your legs.   °¨ Flexing and stretching your leg muscles.   °¨ Squatting.   °¨ Moving your legs.   °¨ Bending over.   °· Only take over-the-counter or prescription medicines as directed by your caregiver. Do not suddenly stop any medicines without asking your caregiver first.  °SEEK MEDICAL CARE IF:  °· Your fainting spells continue or happen more frequently in spite of treatment.   °· You lose consciousness for more than a couple minutes. °· You have fainting spells during or after exercising or after being startled.   °· You have new symptoms that occur with the fainting spells, such as:   °¨ Shortness of breath. °¨ Chest pain.   °¨ Irregular heartbeat.   °· You have episodes of twitching or jerky movements that last longer than a few seconds. °· You have episodes of twitching or jerky movements without obvious fainting. °SEEK IMMEDIATE MEDICAL CARE IF:  °· You have injuries or bleeding after a fainting spell.   °· You have episodes of twitching or jerky movements that last longer than 5 minutes.   °· You have more than one spell of twitching or jerky movements before returning to consciousness after fainting. °MAKE SURE YOU:  °· Understand these instructions. °· Will watch your condition. °· Will get help right away if you are not doing well or get worse. °Document Released: 03/02/2012 Document Reviewed: 03/02/2012 °[employer]® Patient Information ©2015 [employer]. This information is not intended to replace advice given to you by your health care provider. Make sure you discuss any questions you have with your health care provider. ° °

## 2014-10-20 NOTE — ED Provider Notes (Signed)
[facility] Emergency Department Provider Note  ____________________________________________  Time seen: Approximately 4:49 PM  I have reviewed the triage vital signs and the nursing notes.   HISTORY  Chief Complaint Loss of Consciousness    HPI Lucas Edwards is a 64 y.o. male is a history of Parkinson's disease as well as prostate troubles. He reports that he was at the gas station just getting ready to pump gas when he felt like he was getting lightheaded and a little nauseated, he then woke up on the ground and he said that there is a group of people around him reporting that he passed out for about 2-3 seconds.  He gives a history that he has frequent episodes where he will pass out for a few seconds and that he's been recently seen by his doctor and they've been following him for low blood pressure believed to be low related to his Parkinson's disease and possibly some distal autonomic symptoms. States that he usually wouldn't come to the emergency room for this type of event, but since he was out in public people called 546.  Right now he states he feels fine. He relates that he didn't have a lot to eat or drink today, but he has not had any chest pain or trouble breathing. He has not felt his heart racing. No abdominal pain that he did feel nauseated just prior to this happening. He does have a mild headache, but denies any injury or neck pain.  He recently stopped taking his prostate medicine because of low blood pressures. He also reports that when the symptoms occur at home he will check his blood pressure as he often has to lay down and it will be low. He usually recovers within a few minutes and he'll be able to get up and walk again.  At present time he denies any trouble speaking, no weakness, numbness, tingling. Has a mild headache.  Reviewed with the patient and the patient is negative by [place] head CT rules.    Past Medical History   Diagnosis Date  . Parkinson disease   . Abdominal hernia   . Hypertension   . Parkinson disease     Patient Active Problem List   Diagnosis Date Noted  . Paralysis agitans 10/24/2013  . 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 Inguinal hernia repair Bilateral   . Vasectomy      Current Outpatient Rx  Name  Route  Sig  Dispense  Refill  . carbidopa-levodopa (SINEMET CR) 50-200 MG per tablet   Oral   Take 1 tablet by mouth 2 (two) times daily.          . carbidopa-levodopa (SINEMET IR) 25-100 MG per tablet   Oral   Take 2 tablets by mouth 4 (four) times daily. 2 po qid   240 tablet   5   . clonazePAM (KLONOPIN) 0.5 MG tablet      TAKE 1 TABLET BY MOUTH AT BEDTIME   30 tablet   5     This request is for a new prescription for a contr ...   . cyclobenzaprine (FLEXERIL) 5 MG tablet      TAKE 1 TABLET BY MOUTH EVERY NIGHT AS NEEDED FOR MUSCLE SPASM   30 tablet   0   . docusate sodium (COLACE) 100 MG capsule   Oral   Take 100 mg by mouth 3 (three) times daily.          Marland Kitchen  entacapone (COMTAN) 200 MG tablet   Oral   Take 1 tablet (200 mg total) by mouth 3 (three) times daily.   90 tablet   5   . hypromellose (SYSTANE OVERNIGHT THERAPY) 0.3 % GEL ophthalmic ointment   Both Eyes   Place into both eyes at bedtime.         . naproxen sodium (ANAPROX) 220 MG tablet   Oral   Take 220 mg by mouth 2 (two) times daily with a meal.         . pantoprazole (PROTONIX) 40 MG tablet   Oral   Take 40 mg by mouth daily.          Marland Kitchen Propylene Glycol (SYSTANE BALANCE) 0.6 % SOLN   Ophthalmic   Apply to eye.         . terazosin (HYTRIN) 1 MG capsule   Oral   Take 1 mg by mouth daily.           Allergies Sulfa antibiotics and Tylenol  No family history on file.  Social History History  Substance Use Topics  . Smoking status: Former Research scientist (life sciences)  . Smokeless tobacco: Not on file     Comment: during high  school/college  . Alcohol Use: Yes     Comment: social     Review of Systems Constitutional: No fever/chills Eyes: No visual changes. ENT: No sore throat. Cardiovascular: Denies chest pain. Respiratory: Denies shortness of breath. Gastrointestinal: No abdominal pain.  Had nausea resolved. no vomiting.  No diarrhea.  No constipation. Genitourinary: Negative for dysuria. Musculoskeletal: Negative for back pain. Skin: Negative for rash. Neurological: Mild headache but no focal weakness or numbness.  10-point ROS otherwise negative.  ____________________________________________   PHYSICAL EXAM:  VITAL SIGNS: ED Triage Vitals  Enc Vitals Group     BP 10/20/14 1556 159/125 mmHg     Pulse Rate 10/20/14 1556 103     Resp 10/20/14 1556 18     Temp 10/20/14 1556 97.5 F (36.4 C)     Temp Source 10/20/14 1556 Oral     SpO2 10/20/14 1556 95 %     Weight 10/20/14 1556 142 lb (64.411 kg)     Height 10/20/14 1556 5\' 9"  (1.753 m)     Head Cir --      Peak Flow --      Pain Score --      Pain Loc --      Pain Edu? --      Excl. in [place]? --     Constitutional: Alert and oriented. Well appearing and in no acute distress. Eyes: Conjunctivae are normal. PERRL. EOMI. Head: Atraumatic. No evidence of skull fracture. No raccoon eyes. No bruising or swelling on the head. Nose: No congestion/rhinnorhea. Mouth/Throat: Mucous membranes are moist.  Oropharynx non-erythematous. Neck: No stridor.  No cervical spine tenderness. Cardiovascular: Normal rate, regular rhythm. Grossly normal heart sounds.  Good peripheral circulation. Respiratory: Normal respiratory effort.  No retractions. Lungs CTAB. Gastrointestinal: Soft and nontender. No distention. No abdominal bruits. No CVA tenderness. Musculoskeletal: No lower extremity tenderness nor edema.  No joint effusions. Neurologic:  Normal speech and language. No gross focal neurologic deficits are appreciated. Patient demonstrates Parkinsonian  features including parkinsonian face and pill-rolling tremor. Skin:  Skin is warm, dry and intact. No rash noted. Psychiatric: Mood and affect are normal. Speech and behavior are normal.  ____________________________________________   LABS (all labs ordered are listed, but only abnormal results are displayed)  Labs Reviewed  BASIC METABOLIC PANEL - Abnormal; Notable for the following:    Glucose, Bld 124 (*)    BUN 23 (*)    All other components within normal limits  CBC - Abnormal; Notable for the following:    RBC 4.35 (*)    HCT 39.3 (*)    All other components within normal limits   ____________________________________________  EKG  Reviewed and interpreted by me EKG time 1622 Normal sinus rhythm occasional PVC and occasional sinus arrhythmia Ventricular rate 85 PR 136 QTc 4:30 No significant ST abnormality is noted. Impression sinus rhythm with occasional PVC and sinus arrhythmia with no ischemic changes noted ____________________________________________  RADIOLOGY  Based on examination and history there is no dangerous mechanism, not on any anticoagulate. There is no indication for head CT at this time. No neurologic deficits. No Stroke symptoms. ____________________________________________   PROCEDURES  Procedure(s) performed: None  Critical Care performed: No  ____________________________________________   INITIAL IMPRESSION / ASSESSMENT AND PLAN / ED COURSE  Pertinent labs & imaging results that were available during my care of the patient were reviewed by me and considered in my medical decision making (see chart for details).  Feel the patient likely had syncope given his clinical history and findings consistent with some mild dehydration and likely some autonomic symptoms related to Parkinson's and his frequent episodes of hypotension which I believed associated. His blood pressure initially quite low, however has improved after hydration. He gives a  clinical history of frequent episodes where he will come close to passing out and occasionally does pass out and has seen his doctor for these and have stopped his terazosin.  ----------------------------------------- 6:55 PM on 10/20/2014 -----------------------------------------  Patient is alert, denies any symptoms and states his headache is better now. He is normotensive and in no distress. He is been up to use the bathroom and states he does not feel lightheaded and feels normal. There is no evidence of acute cardiopulmonary etiology for today's event. I suspect he likely had a vagal-type episode in conjunction with some autonomic dysfunction possibly related to his Parkinson's. He is well-hydrated now it appears to be at his normal and baseline. I will discharge him to home, family with him and will be escorting him.  I did discuss that he would meet criteria for observation has initial blood pressure was quite low, but he feels it is unnecessary and I agree with his previous clinical history and similar episodes that admission would likely be of no benefit. Return precautions advised. Discharged home. Condition much improved. ____________________________________________   FINAL CLINICAL IMPRESSION(S) / ED DIAGNOSES  Final diagnoses:  Syncope due to orthostatic hypotension      Delman Kitten, MD 10/20/14 1858

## 2014-10-20 NOTE — ED Notes (Signed)
[place] states [place], pt states a full headache, pt has hx of parkinsons, pt awake and alert, pt states he has hx of syncope

## 2014-10-20 NOTE — ED Notes (Signed)
Pt reports was pumping gas and passed out, awoke on the concrete. Denies hitting head. Denies any injury from fall.  States was out for "only seconds". Denies any recent illnesses or feeling bad prior to incident.

## 2014-12-09 ENCOUNTER — Other Ambulatory Visit: Payer: Self-pay | Admitting: Neurology

## 2014-12-10 NOTE — Telephone Encounter (Signed)
Clonazepam refill requested. Per last office note- patient to remain on medication. Refill approved and called to patient's pharmacy.   

## 2015-01-02 ENCOUNTER — Encounter: Payer: Self-pay | Admitting: Neurology

## 2015-01-02 ENCOUNTER — Ambulatory Visit (INDEPENDENT_AMBULATORY_CARE_PROVIDER_SITE_OTHER): Payer: 59 | Admitting: Neurology

## 2015-01-02 ENCOUNTER — Telehealth: Payer: Self-pay | Admitting: Neurology

## 2015-01-02 VITALS — Ht 69.0 in | Wt 140.0 lb

## 2015-01-02 DIAGNOSIS — F331 Major depressive disorder, recurrent, moderate: Secondary | ICD-10-CM

## 2015-01-02 DIAGNOSIS — G903 Multi-system degeneration of the autonomic nervous system: Secondary | ICD-10-CM | POA: Diagnosis not present

## 2015-01-02 DIAGNOSIS — G20 Parkinson's disease: Secondary | ICD-10-CM | POA: Diagnosis not present

## 2015-01-02 NOTE — Patient Instructions (Signed)
[name] - Psychologist Address: [address], [place], [place] [address] Phone: [phone]
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
## 2015-01-02 NOTE — Telephone Encounter (Signed)
Notes faxed to Dr Bary Leriche at [phone] with confirmation received.

## 2015-01-18 ENCOUNTER — Telehealth: Payer: Self-pay | Admitting: Neurology

## 2015-01-18 NOTE — Telephone Encounter (Signed)
[facility] records request received with patient release of information signed. Records faxed to [phone] with confirmation received.

## 2015-03-15 ENCOUNTER — Other Ambulatory Visit: Payer: Self-pay | Admitting: Neurology

## 2015-03-15 NOTE — Telephone Encounter (Signed)
Rx called in 

## 2015-03-15 NOTE — Telephone Encounter (Signed)
Rx sent 

## 2015-04-25 ENCOUNTER — Encounter: Payer: Self-pay | Admitting: Emergency Medicine

## 2015-04-25 ENCOUNTER — Emergency Department
Admission: EM | Admit: 2015-04-25 | Discharge: 2015-04-25 | Disposition: A | Discharge status: Home / Self Care | Payer: 59 | Attending: Emergency Medicine | Admitting: Emergency Medicine

## 2015-04-25 ENCOUNTER — Emergency Department: Payer: 59

## 2015-04-25 DIAGNOSIS — G20 Parkinson's disease: Secondary | ICD-10-CM | POA: Insufficient documentation

## 2015-04-25 DIAGNOSIS — I10 Essential (primary) hypertension: Secondary | ICD-10-CM | POA: Insufficient documentation

## 2015-04-25 DIAGNOSIS — Y92000 Kitchen of unspecified non-institutional (private) residence as  the place of occurrence of the external cause: Secondary | ICD-10-CM | POA: Diagnosis not present

## 2015-04-25 DIAGNOSIS — R55 Syncope and collapse: Secondary | ICD-10-CM | POA: Insufficient documentation

## 2015-04-25 DIAGNOSIS — I959 Hypotension, unspecified: Secondary | ICD-10-CM | POA: Diagnosis not present

## 2015-04-25 DIAGNOSIS — Y998 Other external cause status: Secondary | ICD-10-CM | POA: Diagnosis not present

## 2015-04-25 DIAGNOSIS — W1839XA Other fall on same level, initial encounter: Secondary | ICD-10-CM | POA: Diagnosis not present

## 2015-04-25 DIAGNOSIS — S300XXA Contusion of lower back and pelvis, initial encounter: Secondary | ICD-10-CM | POA: Insufficient documentation

## 2015-04-25 DIAGNOSIS — S0990XA Unspecified injury of head, initial encounter: Secondary | ICD-10-CM | POA: Insufficient documentation

## 2015-04-25 DIAGNOSIS — Z791 Long term (current) use of non-steroidal anti-inflammatories (NSAID): Secondary | ICD-10-CM | POA: Insufficient documentation

## 2015-04-25 DIAGNOSIS — Z87891 Personal history of nicotine dependence: Secondary | ICD-10-CM | POA: Diagnosis not present

## 2015-04-25 DIAGNOSIS — Z79899 Other long term (current) drug therapy: Secondary | ICD-10-CM | POA: Insufficient documentation

## 2015-04-25 DIAGNOSIS — Y9389 Activity, other specified: Secondary | ICD-10-CM | POA: Insufficient documentation

## 2015-04-25 DIAGNOSIS — T148XXA Other injury of unspecified body region, initial encounter: Secondary | ICD-10-CM

## 2015-04-25 LAB — URINALYSIS COMPLETE WITH MICROSCOPIC (ARMC ONLY)
BILIRUBIN URINE: NEGATIVE
Bacteria, UA: NONE SEEN
Glucose, UA: NEGATIVE mg/dL
HGB URINE DIPSTICK: NEGATIVE
Leukocytes, UA: NEGATIVE
NITRITE: NEGATIVE
PH: 6 (ref 5.0–8.0)
Protein, ur: NEGATIVE mg/dL
RBC / HPF: NONE SEEN RBC/hpf (ref 0–5)
Specific Gravity, Urine: 1.015 (ref 1.005–1.030)

## 2015-04-25 LAB — COMPREHENSIVE METABOLIC PANEL
ALT: <5 U/L — ABNORMAL LOW (ref 17–63)
AST: 16 U/L (ref 15–41)
Albumin: 4.8 g/dL (ref 3.5–5.0)
Alkaline Phosphatase: 48 U/L (ref 38–126)
Anion gap: 5 (ref 5–15)
BUN: 20 mg/dL (ref 6–20)
CHLORIDE: 101 mmol/L (ref 101–111)
CO2: 29 mmol/L (ref 22–32)
Calcium: 9 mg/dL (ref 8.9–10.3)
Creatinine, Ser: 1.04 mg/dL (ref 0.61–1.24)
GFR calc non Af Amer: >60 mL/min (ref >60)
Glucose, Bld: 104 mg/dL — ABNORMAL HIGH (ref 65–99)
POTASSIUM: 3.6 mmol/L (ref 3.5–5.1)
SODIUM: 135 mmol/L (ref 135–145)
Total Bilirubin: 0.9 mg/dL (ref 0.3–1.2)
Total Protein: 7.5 g/dL (ref 6.5–8.1)

## 2015-04-25 LAB — CBC WITH DIFFERENTIAL/PLATELET
BASOS PCT: 1 %
Basophils Absolute: 0.1 10*3/uL (ref 0–0.1)
EOS ABS: 0.1 10*3/uL (ref 0–0.7)
Eosinophils Relative: 1 %
HCT: 43 % (ref 40.0–52.0)
Hemoglobin: 14.7 g/dL (ref 13.0–18.0)
Lymphocytes Relative: 11 %
Lymphs Abs: 1.1 10*3/uL (ref 1.0–3.6)
MCH: 30.6 pg (ref 26.0–34.0)
MCHC: 34.2 g/dL (ref 32.0–36.0)
MCV: 89.4 fL (ref 80.0–100.0)
Monocytes Absolute: 0.7 10*3/uL (ref 0.2–1.0)
Monocytes Relative: 7 %
Neutro Abs: 8.1 10*3/uL — ABNORMAL HIGH (ref 1.4–6.5)
Neutrophils Relative %: 80 %
Platelets: 217 10*3/uL (ref 150–440)
RBC: 4.81 MIL/uL (ref 4.40–5.90)
RDW: 13.1 % (ref 11.5–14.5)
WBC: 10 10*3/uL (ref 3.8–10.6)

## 2015-04-25 LAB — TROPONIN I

## 2015-04-25 MED ORDER — SODIUM CHLORIDE 0.9 % IV BOLUS (SEPSIS)
1000.0000 mL | Freq: Once | INTRAVENOUS | Status: AC
  Administered 2015-04-25: 1000 mL via INTRAVENOUS

## 2015-04-25 NOTE — ED Notes (Signed)
MD Kaminksi at bedside.

## 2015-04-25 NOTE — ED Notes (Signed)
Pt ambulated to bathroom x assist of 1 at this time with no concerns. Pt states "felt a little dizzy but not too bad" Pt in no acute distress at this time

## 2015-04-25 NOTE — ED Notes (Signed)
MD Kaminski at bedside. 

## 2015-04-25 NOTE — Discharge Instructions (Signed)
Your blood pressure was fairly low in your arrived, but responded well to IV fluids. Be sure to drink enough water. Don't skip meals. Follow-up with Dr. Netty Starring. Return to the emergency department if you have further weakness or fainting episodes.  Syncope Syncope is a medical term for fainting or passing out. This means you lose consciousness and drop to the ground. People are generally unconscious for less than 5 minutes. You may have some muscle twitches for up to 15 seconds before waking up and returning to normal. Syncope occurs more often in older adults, but it can happen to anyone. While most causes of syncope are not dangerous, syncope can be a sign of a serious medical problem. It is important to seek medical care.  CAUSES  Syncope is caused by a sudden drop in blood flow to the brain. The specific cause is often not determined. Factors that can bring on syncope include:  Taking medicines that lower blood pressure.  Sudden changes in posture, such as standing up quickly.  Taking more medicine than prescribed.  Standing in one place for too long.  Seizure disorders.  Dehydration and excessive exposure to heat.  Low blood sugar (hypoglycemia).  Straining to have a bowel movement.  Heart disease, irregular heartbeat, or other circulatory problems.  Fear, emotional distress, seeing blood, or severe pain. SYMPTOMS  Right before fainting, you may:  Feel dizzy or light-headed.  Feel nauseous.  See all white or all black in your field of vision.  Have cold, clammy skin. DIAGNOSIS  Your health care provider will ask about your symptoms, perform a physical exam, and perform an electrocardiogram (ECG) to record the electrical activity of your heart. Your health care provider may also perform other heart or blood tests to determine the cause of your syncope which may include:  Transthoracic echocardiogram (TTE). During echocardiography, sound waves are used to evaluate how  blood flows through your heart.  Transesophageal echocardiogram (TEE).  Cardiac monitoring. This allows your health care provider to monitor your heart rate and rhythm in real time.  Holter monitor. This is a portable device that records your heartbeat and can help diagnose heart arrhythmias. It allows your health care provider to track your heart activity for several days, if needed.  Stress tests by exercise or by giving medicine that makes the heart beat faster. TREATMENT  In most cases, no treatment is needed. Depending on the cause of your syncope, your health care provider may recommend changing or stopping some of your medicines. HOME CARE INSTRUCTIONS  Have someone stay with you until you feel stable.  Do not drive, use machinery, or play sports until your health care provider says it is okay.  Keep all follow-up appointments as directed by your health care provider.  Lie down right away if you start feeling like you might faint. Breathe deeply and steadily. Wait until all the symptoms have passed.  Drink enough fluids to keep your urine clear or pale yellow.  If you are taking blood pressure or heart medicine, get up slowly and take several minutes to sit and then stand. This can reduce dizziness. SEEK IMMEDIATE MEDICAL CARE IF:   You have a severe headache.  You have unusual pain in the chest, abdomen, or back.  You are bleeding from your mouth or rectum, or you have black or tarry stool.  You have an irregular or very fast heartbeat.  You have pain with breathing.  You have repeated fainting or seizure-like jerking during an  episode.  You faint when sitting or lying down.  You have confusion.  You have trouble walking.  You have severe weakness.  You have vision problems. If you fainted, call your local emergency services (911 in [place] Do not drive yourself to the hospital.    This information is not intended to replace advice given to you by your health  care provider. Make sure you discuss any questions you have with your health care provider.   Document Released: 03/16/2005 Document Revised: 07/31/2014 Document Reviewed: 05/15/2011 Elsevier Interactive Patient Education [employer].

## 2015-04-25 NOTE — ED Notes (Signed)
Patient transported to X-ray 

## 2015-04-25 NOTE — ED Provider Notes (Addendum)
[facility] Emergency Department Provider Note  ____________________________________________  Time seen: 1520  I have reviewed the triage vital signs and the nursing notes.  History by:  Patient  HISTORY  Chief Complaint Loss of Consciousness  syncope    HPI Lucas Edwards is a 65 y.o. male with a history of Parkinson's disease who reports that on occasion, when he stands up too quickly, he will feel lightheaded and has had syncopal episodes in the past and has noted hypotension following these incidents in the past.   Today, while in his kitchen, he stood up from his stool to quickly and lost consciousness. He reports falling and landing on his buttocks. He reports having discomfort and pain in the right ischial area. He denies any palpitations or chest pain or shortness of breath. He denies any acute change in terms of his neurologic status or motor function.  The patient chose to drive himself to the hospital. He reports that as long as he stated he feels fine. Upon arrival, he had notably low blood pressures.   Past Medical History  Diagnosis Date  . Parkinson disease ([place])   . Abdominal hernia   . Hypertension   . Parkinson disease [facility])     Patient Active Problem List   Diagnosis Date Noted  . Paralysis agitans ([place]) 10/24/2013  . 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 Inguinal hernia repair Bilateral   . Vasectomy      Current Outpatient Rx  Name  Route  Sig  Dispense  Refill  . acyclovir (ZOVIRAX) 400 MG tablet   Oral   Take 400 mg by mouth daily.          . carbidopa-levodopa (SINEMET CR) 50-200 MG per tablet   Oral   Take 1 tablet by mouth 3 (three) times daily.          . carbidopa-levodopa (SINEMET IR) 25-100 MG per tablet   Oral   Take 2 tablets by mouth 4 (four) times daily. 2 po qid   240 tablet   5   . clonazePAM (KLONOPIN) 0.5 MG tablet      TAKE 1 TABLET BY  MOUTH AT BEDTIME   30 tablet   5     This request is for a new prescription for a contr ...   . cyclobenzaprine (FLEXERIL) 5 MG tablet      TAKE 1 TABLET BY MOUTH EVERY NIGHT AS NEEDED FOR MUSCLE SPASM   30 tablet   0   . docusate sodium (COLACE) 100 MG capsule   Oral   Take 100 mg by mouth 3 (three) times daily.          . entacapone (COMTAN) 200 MG tablet      TAKE 1 TABLET BY MOUTH 3 TIMES A DAY   90 tablet   5   . naproxen sodium (ANAPROX) 220 MG tablet   Oral   Take 220 mg by mouth 2 (two) times daily with a meal.         . pantoprazole (PROTONIX) 40 MG tablet   Oral   Take 40 mg by mouth daily.            Allergies Sulfa antibiotics and Tylenol  No family history on file.  Social History Social History  Substance Use Topics  . Smoking status: Former Research scientist (life sciences)  . Smokeless tobacco: None     Comment: during high school/college  .  Alcohol Use: Yes     Comment: social     Review of Systems  Constitutional: Negative for fever/chills. ENT: Negative for congestion. Cardiovascular: Negative for chest pain. Positive for syncope with hypotension. Respiratory: Negative for cough. Gastrointestinal: Negative for abdominal pain, vomiting and diarrhea. Genitourinary: Negative for dysuria. Musculoskeletal: No back pain. Skin: Negative for rash. Neurological: History of Parkinson's disease. Patient reports he has a mild headache currently, but reports this is not abnormal and seems to dismiss it.   10-point ROS otherwise negative.  ____________________________________________   PHYSICAL EXAM:  VITAL SIGNS: ED Triage Vitals  Enc Vitals Group     BP 04/25/15 1505 83/49 mmHg     Pulse Rate 04/25/15 1505 98     Resp 04/25/15 1505 18     Temp 04/25/15 1505 97.5 F (36.4 C)     Temp Source 04/25/15 1505 Oral     SpO2 04/25/15 1505 98 %     Weight 04/25/15 1505 140 lb (63.504 kg)     Height 04/25/15 1505 5\' 9"  (1.753 m)     Head Cir --      Peak Flow  --      Pain Score 04/25/15 1507 8     Pain Loc --      Pain Edu? --      Excl. in [place]? --     Constitutional: Alert and oriented. No acute distress. ENT   Head: Normocephalic and atraumatic.   Nose: No congestion/rhinnorhea.       Mouth: No erythema, no swelling   Cardiovascular: Normal rate, regular rhythm, no murmur noted Respiratory:  Normal respiratory effort, no tachypnea.    Breath sounds are clear and equal bilaterally.  Gastrointestinal: Soft, no distention. Nontender Back: No muscle spasm, no tenderness, no CVA tenderness. Musculoskeletal: No deformity noted. Nontender with normal range of motion in all extremities.  No noted edema. Pelvis is stable. No pain over hips. Patient does report tenderness on the right initial tuberosity. Neurologic:  Communicative. Has spontaneous movement in all 4 extremities, but appears to have some global weakness and a mild tremor when he attempts to raise his legs or arms. His strength is 4-5 over 5. Sensation is intact. Skin:  Skin is warm, dry. No rash noted. Psychiatric: Calm, cooperative. Patient has a bit of a flat affect.   ____________________________________________    LABS (pertinent positives/negatives)  Labs Reviewed  CBC WITH DIFFERENTIAL/PLATELET - Abnormal; Notable for the following:    Neutro Abs 8.1 (*)    All other components within normal limits  COMPREHENSIVE METABOLIC PANEL - Abnormal; Notable for the following:    Glucose, Bld 104 (*)    ALT <5 (*)    All other components within normal limits  URINALYSIS COMPLETEWITH MICROSCOPIC ([facility] ONLY) - Abnormal; Notable for the following:    Color, Urine AMBER (*)    APPearance CLEAR (*)    Ketones, ur TRACE (*)    Squamous Epithelial / LPF 0-5 (*)    All other components within normal limits  TROPONIN I     ____________________________________________   EKG  ED ECG REPORT I, Matteus Mcnelly W, the attending physician, personally viewed and interpreted this  ECG.   Date: 04/25/2015  EKG Time: 1515  Rate: 89  Rhythm: sinus rhythm  Axis: -16  Intervals: Normal  ST&T Change: None noted   ____________________________________________    RADIOLOGY  Pelvis: IMPRESSION: No demonstrable fracture or dislocation. No appreciable arthropathic change.  ____________________________________________   PROCEDURES  ____________________________________________  INITIAL IMPRESSION / ASSESSMENT AND PLAN / ED COURSE  Pertinent labs & imaging results that were available during my care of the patient were reviewed by me and considered in my medical decision making (see chart for details).  65 year old male with Parkinson's disease who lives at home with his brother-in-law. He has had similar syncopal episodes dating back over a year. The last notable episode he reports was approximately one month ago. He believes he has spoken with his primary physician, Dr. Netty Starring, about this, but he is not sure and does n  ot seem convincing about this. I do not think he's had a greater cardiac workup. He has not worn a Holter monitor for evaluation. He does report having gone to a walk-in clinic once and being prescribed meclizine for these symptoms.  I am concerned about the patient given his notable hypertension on arrival. I have suggested that he would benefit from observation the hospital. During this discussion following the history and physical, the patient was blood about refusing hospital admission. I deferred further discussion until there is more information from lab tests and x-ray.  We will continue with cardiac monitoring and IV fluids.  ----------------------------------------- 5:06 PM on 04/25/2015 -----------------------------------------  Labs reviewed. Blood count is reasonable with hemoglobin of 14.7. Metabolic panel is reasonable. Urinalysis looks normal with no sign of infection. Pelvic x-ray does not show any acute fracture. On  reassessment after 1 L of normal saline  ----------------------------------------- 6:17 PM on 04/25/2015 -----------------------------------------  Patient continues to look significantly better than he did on arrival. His blood pressure has been reasonable and stable. He appears more energetic and alert and negative. I have discussed his results and situation with him as well as with his niece, Lucas Edwards. She is also his Systems analyst. The 3 of [place] have agreed that he is safe and stable to go home. He can follow-up with Dr. Netty Starring. He does have a plan of moving to [facility] for further ongoing care.  ____________________________________________   FINAL CLINICAL IMPRESSION(S) / ED DIAGNOSES  Final diagnoses:  Syncope and collapse  Contusion  Hypotension, unspecified hypotension type   (Contusion is to the right buttocks)    Ahmed Prima, MD 04/25/15 Vernelle [name]  Ahmed Prima, MD 04/25/15 [phone]

## 2015-04-25 NOTE — ED Notes (Signed)
Says he got up from couch and he got light  Headed and tried to sit down, but passed out and woke up on floor.  Says this has happened before.  Has history of parkinsons.

## 2015-06-11 DIAGNOSIS — Z7185 Encounter for immunization safety counseling: Secondary | ICD-10-CM | POA: Insufficient documentation

## 2015-06-28 ENCOUNTER — Other Ambulatory Visit: Payer: Self-pay | Admitting: Neurology

## 2015-07-01 NOTE — Telephone Encounter (Signed)
Carbidopa Levodopa and Clonazepam refill requested. Per last office note- patient to remain on medication. Refill approved and sent to patient's pharmacy.

## 2015-07-03 ENCOUNTER — Encounter: Payer: Self-pay | Admitting: Neurology

## 2015-07-03 ENCOUNTER — Ambulatory Visit (INDEPENDENT_AMBULATORY_CARE_PROVIDER_SITE_OTHER): Payer: 59 | Admitting: Neurology

## 2015-07-03 VITALS — Ht 69.0 in | Wt 145.0 lb

## 2015-07-03 DIAGNOSIS — G903 Multi-system degeneration of the autonomic nervous system: Secondary | ICD-10-CM | POA: Diagnosis not present

## 2015-07-03 DIAGNOSIS — G20 Parkinson's disease: Secondary | ICD-10-CM

## 2015-07-03 DIAGNOSIS — G249 Dystonia, unspecified: Secondary | ICD-10-CM | POA: Diagnosis not present
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
## 2015-07-03 NOTE — Patient Instructions (Signed)
Lucas Edwards -  Address: [address], [place], [place] [address] Phone: [phone]

## 2015-10-19 ENCOUNTER — Emergency Department: Payer: 59

## 2015-10-19 ENCOUNTER — Emergency Department
Admission: EM | Admit: 2015-10-19 | Discharge: 2015-10-19 | Disposition: A | Discharge status: Home / Self Care | Payer: 59 | Attending: Emergency Medicine | Admitting: Emergency Medicine

## 2015-10-19 ENCOUNTER — Encounter: Payer: Self-pay | Admitting: Emergency Medicine

## 2015-10-19 DIAGNOSIS — I10 Essential (primary) hypertension: Secondary | ICD-10-CM | POA: Insufficient documentation

## 2015-10-19 DIAGNOSIS — E860 Dehydration: Secondary | ICD-10-CM | POA: Diagnosis not present

## 2015-10-19 DIAGNOSIS — Z87891 Personal history of nicotine dependence: Secondary | ICD-10-CM | POA: Insufficient documentation

## 2015-10-19 DIAGNOSIS — Z79899 Other long term (current) drug therapy: Secondary | ICD-10-CM | POA: Insufficient documentation

## 2015-10-19 DIAGNOSIS — R0789 Other chest pain: Secondary | ICD-10-CM | POA: Diagnosis not present

## 2015-10-19 DIAGNOSIS — R42 Dizziness and giddiness: Secondary | ICD-10-CM

## 2015-10-19 DIAGNOSIS — G20 Parkinson's disease: Secondary | ICD-10-CM | POA: Diagnosis not present

## 2015-10-19 LAB — BASIC METABOLIC PANEL
ANION GAP: 5 (ref 5–15)
BUN: 21 mg/dL — AB (ref 6–20)
CALCIUM: 8.4 mg/dL — AB (ref 8.9–10.3)
CO2: 26 mmol/L (ref 22–32)
Chloride: 107 mmol/L (ref 101–111)
Creatinine, Ser: 1.01 mg/dL (ref 0.61–1.24)
GFR calc Af Amer: >60 mL/min (ref >60)
GLUCOSE: 101 mg/dL — AB (ref 65–99)
Potassium: 4.1 mmol/L (ref 3.5–5.1)
Sodium: 138 mmol/L (ref 135–145)

## 2015-10-19 LAB — CBC
HCT: 39.5 % — ABNORMAL LOW (ref 40.0–52.0)
HEMOGLOBIN: 13.9 g/dL (ref 13.0–18.0)
MCH: 31.9 pg (ref 26.0–34.0)
MCHC: 35.2 g/dL (ref 32.0–36.0)
MCV: 90.5 fL (ref 80.0–100.0)
Platelets: 176 10*3/uL (ref 150–440)
RBC: 4.37 MIL/uL — ABNORMAL LOW (ref 4.40–5.90)
RDW: 13.8 % (ref 11.5–14.5)
WBC: 7.1 10*3/uL (ref 3.8–10.6)

## 2015-10-19 LAB — TROPONIN I

## 2015-10-19 MED ORDER — SODIUM CHLORIDE 0.9 % IV BOLUS (SEPSIS)
1000.0000 mL | Freq: Once | INTRAVENOUS | Status: AC
  Administered 2015-10-19: 1000 mL via INTRAVENOUS

## 2015-10-19 NOTE — ED Notes (Signed)
Patient transported to X-ray 

## 2015-10-19 NOTE — Discharge Instructions (Signed)
You were evaluated for dizziness, and chest pressure after being out in the heat and running today. Your exam and evaluation are reassuring. You were given fluids for dehydration.  Call your cardiologist and make an appointment this week for reevaluation for nonspecific chest pressure.  Return to the department for any worsening condition including chest pain, trouble breathing, weakness, numbness, dizziness or passing out, or any other symptoms concerning to you.   Dehydration, Adult Dehydration is a condition in which you do not have enough fluid or water in your body. It happens when you take in less fluid than you lose. Vital organs such as the kidneys, brain, and heart cannot function without a proper amount of fluids. Any loss of fluids from the body can cause dehydration.  Dehydration can range from mild to severe. This condition should be treated right away to help prevent it from becoming severe. CAUSES  This condition may be caused by:  Vomiting.  Diarrhea.  Excessive sweating, such as when exercising in hot or humid weather.  Not drinking enough fluid during strenuous exercise or during an illness.  Excessive urine output.  Fever.  Certain medicines. RISK FACTORS This condition is more likely to develop in:  People who are taking certain medicines that cause the body to lose excess fluid (diuretics).   People who have a chronic illness, such as diabetes, that may increase urination.  Older adults.   People who live at high altitudes.   People who participate in endurance sports.  SYMPTOMS  Mild Dehydration  Thirst.  Dry lips.  Slightly dry mouth.  Dry, warm skin. Moderate Dehydration  Very dry mouth.   Muscle cramps.   Dark urine and decreased urine production.   Decreased tear production.   Headache.   Light-headedness, especially when you stand up from a sitting position.  Severe Dehydration  Changes in skin.   Cold and clammy  skin.   Skin does not spring back quickly when lightly pinched and released.   Changes in body fluids.   Extreme thirst.   No tears.   Not able to sweat when body temperature is high, such as in hot weather.   Minimal urine production.   Changes in vital signs.   Rapid, weak pulse (more than 100 beats per minute when you are sitting still).   Rapid breathing.   Low blood pressure.   Other changes.   Sunken eyes.   Cold hands and feet.   Confusion.  Lethargy and difficulty being awakened.  Fainting (syncope).   Short-term weight loss.   Unconsciousness. DIAGNOSIS  This condition may be diagnosed based on your symptoms. You may also have tests to determine how severe your dehydration is. These tests may include:   Urine tests.   Blood tests.  TREATMENT  Treatment for this condition depends on the severity. Mild or moderate dehydration can often be treated at home. Treatment should be started right away. Do not wait until dehydration becomes severe. Severe dehydration needs to be treated at the hospital. Treatment for Mild Dehydration  Drinking plenty of water to replace the fluid you have lost.   Replacing minerals in your blood (electrolytes) that you may have lost.  Treatment for Moderate Dehydration  Consuming oral rehydration solution (ORS). Treatment for Severe Dehydration  Receiving fluid through an IV tube.   Receiving electrolyte solution through a feeding tube that is passed through your nose and into your stomach (nasogastric tube or NG tube).  Correcting any abnormalities in electrolytes.  HOME CARE INSTRUCTIONS   Drink enough fluid to keep your urine clear or pale yellow.   Drink water or fluid slowly by taking small sips. You can also try sucking on ice cubes.  Have food or beverages that contain electrolytes. Examples include bananas and sports drinks.  Take over-the-counter and prescription medicines only as told  by your health care provider.   Prepare ORS according to the manufacturer's instructions. Take sips of ORS every 5 minutes until your urine returns to normal.  If you have vomiting or diarrhea, continue to try to drink water, ORS, or both.   If you have diarrhea, avoid:   Beverages that contain caffeine.   Fruit juice.   Milk.   Carbonated soft drinks.  Do not take salt tablets. This can lead to the condition of having too much sodium in your body (hypernatremia).  SEEK MEDICAL CARE IF:  You cannot eat or drink without vomiting.  You have had moderate diarrhea during a period of more than 24 hours.  You have a fever. SEEK IMMEDIATE MEDICAL CARE IF:   You have extreme thirst.  You have severe diarrhea.  You have not urinated in 6-8 hours, or you have urinated only a small amount of very dark urine.  You have shriveled skin.  You are dizzy, confused, or both.   This information is not intended to replace advice given to you by your health care provider. Make sure you discuss any questions you have with your health care provider.   Document Released: 03/16/2005 Document Revised: 12/05/2014 Document Reviewed: 08/01/2014 Elsevier Interactive Patient Education 2016 Else[employer] Inc[employer]  Nonspecific Chest Pain It is often hard to find the cause of chest pain. There is always a chance that your pain could be related to something serious, such as a heart attack or a blood clot in your lungs. Chest pain can also be caused by conditions that are not life-threatening. If you have chest pain, it is very important to follow up with your doctor.  HOME CARE  If you were prescribed an antibiotic medicine, finish it all even if you start to feel better.  Avoid any activities that cause chest pain.  Do not use any tobacco products, including cigarettes, chewing tobacco, or electronic cigarettes. If you need help quitting, ask your doctor.  Do not drink alcohol.  Take medicines  only as told by your doctor.  Keep all follow-up visits as told by your doctor. This is important. This includes any further testing if your chest pain does not go away.  Your doctor may tell you to keep your head raised (elevated) while you sleep.  Make lifestyle changes as told by your doctor. These may include:  Getting regular exercise. Ask your doctor to suggest some activities that are safe for you.  Eating a heart-healthy diet. Your doctor or a diet specialist (dietitian) can help you to learn healthy eating options.  Maintaining a healthy weight.  Managing diabetes, if necessary.  Reducing stress. GET HELP IF:  Your chest pain does not go away, even after treatment.  You have a rash with blisters on your chest.  You have a fever. GET HELP RIGHT AWAY IF:  Your chest pain is worse.  You have an increasing cough, or you cough up blood.  You have severe belly (abdominal) pain.  You feel extremely weak.  You pass out (faint).  You have chills.  You have sudden, unexplained chest discomfort.  You have sudden, unexplained discomfort in your  arms, back, neck, or jaw.  You have shortness of breath at any time.  You suddenly start to sweat, or your skin gets clammy.  You feel nauseous.  You vomit.  You suddenly feel light-headed or dizzy.  Your heart begins to beat quickly, or it feels like it is skipping beats. These symptoms may be an emergency. Do not wait to see if the symptoms will go away. Get medical help right away. Call your local emergency services (911 in the [place] Do not drive yourself to the hospital.   This information is not intended to replace advice given to you by your health care provider. Make sure you discuss any questions you have with your health care provider.   Document Released: 09/02/2007 Document Revised: 04/06/2014 Document Reviewed: 10/20/2013 Elsevier Interactive Patient Education [employer].

## 2015-10-19 NOTE — ED Notes (Addendum)
Pt ran from house to driveway and got dizzy. Was leaning on propane tank and family got nervous and called EMS. Pt admits he has some chest pain at the time but this has resolved. During episode pt saw some black spots, but reports this resolved. Breath sounds WNL. Pt reports hot and sweaty at time of episode.

## 2015-10-19 NOTE — ED Notes (Signed)
Pt ran from mailbox back to house and got dizzy. Leaned on propane tank and family saw him and called EMS

## 2015-10-19 NOTE — ED Notes (Signed)
bp was 87 systolic with EMS

## 2015-10-19 NOTE — ED Provider Notes (Signed)
[facility] Emergency Department Provider Note   ____________________________________________  Time seen:  I have reviewed the triage vital signs and the triage nursing note.  HISTORY  Chief Complaint Dizziness   Historian Patient  HPI Lucas Edwards is a 65 y.o. male with Parkinson's disease presents today after dizziness while out in the heat. Today's heat index is 109. 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 He states that he was running down his driveway which is "a long way "and that the baby D or jumped on front of him and kind of startled him. When he got back to the house he felt a little dizzy/lightheaded and a little bit of chest pressure.  States he has no known coronary artery disease or cardiac history. He states he had a cardiac catheterization many years ago. States he was a little short of breath/dyspneic after he had been running. No confusion altered mental status. Tremors associated with Parkinson's are either baseline.  EMS reported blood pressure in the 80s initially. Patient reports slightly low blood pressure normally.      Past Medical History  Diagnosis Date  . Parkinson disease ([place])   . Abdominal hernia   . Parkinson disease ([place])   . History of hypertension     Patient Active Problem List   Diagnosis Date Noted  . Paralysis agitans ([place]) 10/24/2013  . 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 Inguinal hernia repair Bilateral   . Vasectomy      Current Outpatient Rx  Name  Route  Sig  Dispense  Refill  . acyclovir (ZOVIRAX) 400 MG tablet   Oral   Take 400 mg by mouth daily.          . benzonatate (TESSALON) 100 MG capsule   Oral   Take by mouth 3 (three) times daily as needed for cough.         . carbidopa-levodopa (SINEMET CR) 50-200 MG per tablet   Oral   Take 1 tablet by mouth 3 (three)  times daily.          . carbidopa-levodopa (SINEMET IR) 25-100 MG tablet      TAKE 2 TABLETS BY MOUTH 4 TIMES A DAY   240 tablet   5   . clonazePAM (KLONOPIN) 0.5 MG tablet      TAKE 1 TABLET BY MOUTH AT BEDTIME   30 tablet   5     This request is for a new prescription for a contr ...   . cyclobenzaprine (FLEXERIL) 5 MG tablet      TAKE 1 TABLET BY MOUTH EVERY NIGHT AS NEEDED FOR MUSCLE SPASM   30 tablet   0   . docusate sodium (COLACE) 100 MG capsule   Oral   Take 100 mg by mouth 3 (three) times daily.          Marland Kitchen donepezil (ARICEPT) 5 MG tablet   Oral   Take 1 tablet by mouth daily.         . entacapone (COMTAN) 200 MG tablet      TAKE 1 TABLET BY MOUTH 3 TIMES A DAY   90 tablet   5   . meclizine (ANTIVERT) 12.5 MG tablet   Oral   Take 12.5 mg by mouth 3 (three) times daily as needed for dizziness.         Marland Kitchen  naproxen sodium (ANAPROX) 220 MG tablet   Oral   Take 220 mg by mouth 2 (two) times daily with a meal.         . pantoprazole (PROTONIX) 40 MG tablet   Oral   Take 40 mg by mouth daily.          Marland Kitchen terazosin (HYTRIN) 1 MG capsule   Oral   Take 1 mg by mouth at bedtime.           Allergies Sulfa antibiotics and Tylenol  History reviewed. No pertinent family history.  Social History Social History  Substance Use Topics  . Smoking status: Former Research scientist (life sciences)  . Smokeless tobacco: None     Comment: during high school/college  . Alcohol Use: Yes     Comment: social     Review of Systems  Constitutional: Negative for fever or recent illness. Eyes: Negative for visual changes. ENT: Negative for sore throat. Cardiovascular: chest pressure when he finished running, none now. Respiratory: Shortness of breath when he finished running, none now. Gastrointestinal: Negative for abdominal pain, vomiting and diarrhea. Genitourinary: Negative for dysuria. Musculoskeletal: Negative for back pain. Skin: Negative for rash. Neurological:  Negative for headache. 10 point Review of Systems otherwise negative ____________________________________________   PHYSICAL EXAM:  VITAL SIGNS: ED Triage Vitals  Enc Vitals Group     BP --      Pulse --      Resp --      Temp --      Temp src --      SpO2 --      Weight --      Height --      Head Cir --      Peak Flow --      Pain Score 10/19/15 1326 5     Pain Loc --      Pain Edu? --      Excl. in [place]? --      Constitutional: Alert and oriented. Well appearing and in no distress.  Tremor/movements of head. HEENT   Head: Normocephalic and atraumatic.      Eyes: Conjunctivae are normal. PERRL. Normal extraocular movements.      Ears:         Nose: No congestion/rhinnorhea.   Mouth/Throat: Mucous membranes are moist.   Neck: No stridor. Cardiovascular/Chest: Normal rate, regular rhythm.  No murmurs, rubs, or gallops. Respiratory: Normal respiratory effort without tachypnea nor retractions. Breath sounds are clear and equal bilaterally. No wheezes/rales/rhonchi. Gastrointestinal: Soft. No distention, no guarding, no rebound. Nontender.  Genitourinary/rectal:Deferred Musculoskeletal: Nontender with normal range of motion in all extremities. No joint effusions.  No lower extremity tenderness.  No edema. Neurologic:  Normal speech and language. No gross or focal neurologic deficits are appreciated. Skin:  Skin is warm, dry and intact. No rash noted. Psychiatric: Mood and affect are normal. Speech and behavior are normal. Patient exhibits appropriate insight and judgment.  ____________________________________________   EKG I, Lisa Roca, MD, the attending physician have personally viewed and interpreted all ECGs.  93 bpm. normal sinus rhythm. Narrow QRS. Normal axis. Normal ST and T-wave. ____________________________________________  LABS (pertinent positives/negatives)  Labs Reviewed  BASIC METABOLIC PANEL - Abnormal; Notable for the following:     Glucose, Bld 101 (*)    BUN 21 (*)    Calcium 8.4 (*)    All other components within normal limits  CBC - Abnormal; Notable for the following:    RBC 4.37 (*)  HCT 39.5 (*)    All other components within normal limits  TROPONIN I  TROPONIN I    ____________________________________________  RADIOLOGY All Xrays were viewed by me. Imaging interpreted by Radiologist.  Chest two-view: IMPRESSION: Probable mild emphysematous lung disease. No acute findings. __________________________________________  PROCEDURES  Procedure(s) performed: None  Critical Care performed: None  ____________________________________________   ED COURSE / ASSESSMENT AND PLAN  Pertinent labs & imaging results that were available during my care of the patient were reviewed by me and considered in my medical decision making (see chart for details).   His symptoms sound like heat and exertion after being outside in the extremely hot temperatures, exerting himself including running. He is feeling much better now. He does not report a history of coronary artery disease.   Orthostatic positive. Patient was given 2 L normal saline and repeat orthostatics were negative.  EKG is reassuring. I did send a 3 hour repeat troponin which is again negative.  Patient okay for outpatient management and follow-up. I did recommend he follow up with cardiology.  CONSULTATIONS:   None   Patient / Family / Caregiver informed of clinical course, medical decision-making process, and agree with plan.   I discussed return precautions, follow-up instructions, and discharged instructions with patient and/or family.   ___________________________________________   FINAL CLINICAL IMPRESSION(S) / ED DIAGNOSES   Final diagnoses:  Chest pressure  Orthostatic dizziness  Dehydration              Note: This dictation was prepared with Dragon dictation. Any transcriptional errors that result from this  process are unintentional   Lisa Roca, MD 10/19/15 [phone]

## 2016-10-29 DIAGNOSIS — N400 Enlarged prostate without lower urinary tract symptoms: Secondary | ICD-10-CM | POA: Diagnosis not present

## 2016-10-30 DIAGNOSIS — G20 Parkinson's disease: Secondary | ICD-10-CM | POA: Diagnosis not present

## 2016-10-30 DIAGNOSIS — R413 Other amnesia: Secondary | ICD-10-CM | POA: Diagnosis not present

## 2016-10-30 DIAGNOSIS — G4752 REM sleep behavior disorder: Secondary | ICD-10-CM | POA: Diagnosis not present

## 2016-10-30 DIAGNOSIS — G4711 Idiopathic hypersomnia with long sleep time: Secondary | ICD-10-CM | POA: Diagnosis not present

## 2016-10-30 DIAGNOSIS — R42 Dizziness and giddiness: Secondary | ICD-10-CM | POA: Diagnosis not present

## 2016-12-07 DIAGNOSIS — R413 Other amnesia: Secondary | ICD-10-CM | POA: Diagnosis not present

## 2016-12-07 DIAGNOSIS — G20 Parkinson's disease: Secondary | ICD-10-CM | POA: Diagnosis not present

## 2016-12-07 DIAGNOSIS — R42 Dizziness and giddiness: Secondary | ICD-10-CM | POA: Diagnosis not present

## 2016-12-07 DIAGNOSIS — G4711 Idiopathic hypersomnia with long sleep time: Secondary | ICD-10-CM | POA: Diagnosis not present

## 2016-12-07 DIAGNOSIS — G478 Other sleep disorders: Secondary | ICD-10-CM | POA: Diagnosis not present

## 2016-12-23 DIAGNOSIS — R350 Frequency of micturition: Secondary | ICD-10-CM | POA: Diagnosis not present

## 2016-12-23 DIAGNOSIS — N3281 Overactive bladder: Secondary | ICD-10-CM | POA: Diagnosis not present

## 2016-12-23 DIAGNOSIS — N401 Enlarged prostate with lower urinary tract symptoms: Secondary | ICD-10-CM | POA: Diagnosis not present

## 2017-01-21 DIAGNOSIS — Z23 Encounter for immunization: Secondary | ICD-10-CM | POA: Diagnosis not present

## 2017-03-10 DIAGNOSIS — R2681 Unsteadiness on feet: Secondary | ICD-10-CM | POA: Diagnosis not present

## 2017-03-12 DIAGNOSIS — R2681 Unsteadiness on feet: Secondary | ICD-10-CM | POA: Diagnosis not present

## 2017-04-07 DIAGNOSIS — N3281 Overactive bladder: Secondary | ICD-10-CM | POA: Diagnosis not present

## 2017-04-07 DIAGNOSIS — N400 Enlarged prostate without lower urinary tract symptoms: Secondary | ICD-10-CM | POA: Diagnosis not present

## 2017-04-22 DIAGNOSIS — L821 Other seborrheic keratosis: Secondary | ICD-10-CM | POA: Diagnosis not present

## 2017-04-22 DIAGNOSIS — C4442 Squamous cell carcinoma of skin of scalp and neck: Secondary | ICD-10-CM | POA: Diagnosis not present

## 2017-04-22 DIAGNOSIS — C4492 Squamous cell carcinoma of skin, unspecified: Secondary | ICD-10-CM

## 2017-04-22 DIAGNOSIS — L578 Other skin changes due to chronic exposure to nonionizing radiation: Secondary | ICD-10-CM | POA: Diagnosis not present

## 2017-04-22 DIAGNOSIS — D485 Neoplasm of uncertain behavior of skin: Secondary | ICD-10-CM | POA: Diagnosis not present

## 2017-04-22 HISTORY — DX: Squamous cell carcinoma of skin, unspecified: C44.92

## 2017-05-03 DIAGNOSIS — G4752 REM sleep behavior disorder: Secondary | ICD-10-CM | POA: Diagnosis not present

## 2017-05-03 DIAGNOSIS — G4711 Idiopathic hypersomnia with long sleep time: Secondary | ICD-10-CM | POA: Diagnosis not present

## 2017-05-03 DIAGNOSIS — G20 Parkinson's disease: Secondary | ICD-10-CM | POA: Diagnosis not present

## 2017-05-03 DIAGNOSIS — R42 Dizziness and giddiness: Secondary | ICD-10-CM | POA: Diagnosis not present

## 2017-05-03 DIAGNOSIS — R413 Other amnesia: Secondary | ICD-10-CM | POA: Diagnosis not present

## 2017-05-04 DIAGNOSIS — J069 Acute upper respiratory infection, unspecified: Secondary | ICD-10-CM | POA: Diagnosis not present

## 2017-05-04 DIAGNOSIS — G20 Parkinson's disease: Secondary | ICD-10-CM | POA: Diagnosis not present

## 2017-05-11 DIAGNOSIS — R32 Unspecified urinary incontinence: Secondary | ICD-10-CM | POA: Diagnosis not present

## 2017-05-11 DIAGNOSIS — M6281 Muscle weakness (generalized): Secondary | ICD-10-CM | POA: Diagnosis not present

## 2017-05-12 DIAGNOSIS — R32 Unspecified urinary incontinence: Secondary | ICD-10-CM | POA: Diagnosis not present

## 2017-05-12 DIAGNOSIS — M6281 Muscle weakness (generalized): Secondary | ICD-10-CM | POA: Diagnosis not present

## 2017-05-14 ENCOUNTER — Other Ambulatory Visit: Payer: Self-pay

## 2017-05-14 ENCOUNTER — Observation Stay
Admission: EM | Admit: 2017-05-14 | Discharge: 2017-05-15 | Disposition: A | Discharge status: Nursing Home (Includes ICF) | Payer: Medicare Other | Attending: Internal Medicine | Admitting: Internal Medicine

## 2017-05-14 DIAGNOSIS — G20 Parkinson's disease: Secondary | ICD-10-CM | POA: Insufficient documentation

## 2017-05-14 DIAGNOSIS — I200 Unstable angina: Secondary | ICD-10-CM | POA: Diagnosis present

## 2017-05-14 DIAGNOSIS — Z8249 Family history of ischemic heart disease and other diseases of the circulatory system: Secondary | ICD-10-CM | POA: Diagnosis not present

## 2017-05-14 DIAGNOSIS — K219 Gastro-esophageal reflux disease without esophagitis: Secondary | ICD-10-CM | POA: Insufficient documentation

## 2017-05-14 DIAGNOSIS — Z79899 Other long term (current) drug therapy: Secondary | ICD-10-CM | POA: Diagnosis not present

## 2017-05-14 DIAGNOSIS — G909 Disorder of the autonomic nervous system, unspecified: Secondary | ICD-10-CM | POA: Insufficient documentation

## 2017-05-14 DIAGNOSIS — R0789 Other chest pain: Secondary | ICD-10-CM | POA: Diagnosis not present

## 2017-05-14 DIAGNOSIS — I959 Hypotension, unspecified: Secondary | ICD-10-CM | POA: Diagnosis not present

## 2017-05-14 DIAGNOSIS — R079 Chest pain, unspecified: Secondary | ICD-10-CM | POA: Diagnosis not present

## 2017-05-14 DIAGNOSIS — R55 Syncope and collapse: Secondary | ICD-10-CM | POA: Diagnosis not present

## 2017-05-14 DIAGNOSIS — I10 Essential (primary) hypertension: Secondary | ICD-10-CM | POA: Diagnosis not present

## 2017-05-14 DIAGNOSIS — Z87891 Personal history of nicotine dependence: Secondary | ICD-10-CM | POA: Diagnosis not present

## 2017-05-14 DIAGNOSIS — Z66 Do not resuscitate: Secondary | ICD-10-CM | POA: Diagnosis not present

## 2017-05-14 DIAGNOSIS — R1310 Dysphagia, unspecified: Secondary | ICD-10-CM | POA: Insufficient documentation

## 2017-05-14 DIAGNOSIS — R748 Abnormal levels of other serum enzymes: Secondary | ICD-10-CM | POA: Insufficient documentation

## 2017-05-14 HISTORY — DX: Hyperlipidemia, unspecified: E78.5

## 2017-05-14 HISTORY — DX: Gastro-esophageal reflux disease without esophagitis: K21.9

## 2017-05-14 LAB — BASIC METABOLIC PANEL
Anion gap: 9 (ref 5–15)
BUN: 9 mg/dL (ref 6–20)
CO2: 28 mmol/L (ref 22–32)
Calcium: 8.9 mg/dL (ref 8.9–10.3)
Chloride: 102 mmol/L (ref 101–111)
Creatinine, Ser: 1.08 mg/dL (ref 0.61–1.24)
GFR calc Af Amer: >60 mL/min (ref >60)
GLUCOSE: 92 mg/dL (ref 65–99)
Potassium: 3.9 mmol/L (ref 3.5–5.1)
Sodium: 139 mmol/L (ref 135–145)

## 2017-05-14 LAB — TROPONIN I
Troponin I: <0.03 ng/mL (ref <0.03)
Troponin I: 0.06 ng/mL (ref <0.03)
Troponin I: 0.08 ng/mL (ref <0.03)
Troponin I: 0.06 ng/mL (ref <0.03)

## 2017-05-14 LAB — CBC
HCT: 45.6 % (ref 40.0–52.0)
Hemoglobin: 15.6 g/dL (ref 13.0–18.0)
MCH: 30.6 pg (ref 26.0–34.0)
MCHC: 34.2 g/dL (ref 32.0–36.0)
MCV: 89.5 fL (ref 80.0–100.0)
Platelets: 215 10*3/uL (ref 150–440)
RBC: 5.1 MIL/uL (ref 4.40–5.90)
RDW: 13.6 % (ref 11.5–14.5)
WBC: 7.4 10*3/uL (ref 3.8–10.6)

## 2017-05-14 LAB — MRSA PCR SCREENING: MRSA by PCR: NEGATIVE

## 2017-05-14 MED ORDER — ASPIRIN EC 325 MG PO TBEC
325.0000 mg | DELAYED_RELEASE_TABLET | Freq: Every day | ORAL | Status: DC
  Filled 2017-05-14: qty 1

## 2017-05-14 MED ORDER — CARBIDOPA-LEVODOPA 25-250 MG PO TABS
2.0000 | ORAL_TABLET | Freq: Once | ORAL | Status: AC
  Administered 2017-05-14: 2 via ORAL
  Filled 2017-05-14: qty 2

## 2017-05-14 MED ORDER — PANTOPRAZOLE SODIUM 40 MG PO TBEC
40.0000 mg | DELAYED_RELEASE_TABLET | Freq: Every day | ORAL | Status: DC
  Administered 2017-05-15: 40 mg via ORAL
  Filled 2017-05-14: qty 1

## 2017-05-14 MED ORDER — CARBIDOPA-LEVODOPA 25-100 MG PO TABS: 1.0000 | ORAL_TABLET | ORAL | Status: DC

## 2017-05-14 MED ORDER — ENTACAPONE 200 MG PO TABS
200.0000 mg | ORAL_TABLET | Freq: Once | ORAL | Status: AC
  Administered 2017-05-14: 200 mg via ORAL
  Filled 2017-05-14: qty 1

## 2017-05-14 MED ORDER — CARBIDOPA-LEVODOPA 25-250 MG PO TABS
2.0000 | ORAL_TABLET | ORAL | Status: AC
  Administered 2017-05-14: 2 via ORAL
  Filled 2017-05-14: qty 2

## 2017-05-14 MED ORDER — CARBIDOPA-LEVODOPA 25-100 MG PO TABS
2.0000 | ORAL_TABLET | ORAL | Status: DC
  Administered 2017-05-14 – 2017-05-15 (×4): 2 via ORAL
  Filled 2017-05-14 (×7): qty 2

## 2017-05-14 MED ORDER — ENTACAPONE 200 MG PO TABS
200.0000 mg | ORAL_TABLET | Freq: Four times a day (QID) | ORAL | Status: DC
  Administered 2017-05-14 – 2017-05-15 (×4): 200 mg via ORAL
  Filled 2017-05-14 (×6): qty 1

## 2017-05-14 MED ORDER — ACETAMINOPHEN 325 MG PO TABS: 650.0000 mg | ORAL_TABLET | ORAL | Status: DC | PRN

## 2017-05-14 MED ORDER — ENOXAPARIN SODIUM 40 MG/0.4ML ~~LOC~~ SOLN
40.0000 mg | SUBCUTANEOUS | Status: DC
  Administered 2017-05-14: 40 mg via SUBCUTANEOUS
  Filled 2017-05-14: qty 0.4

## 2017-05-14 MED ORDER — CARBIDOPA-LEVODOPA 25-100 MG PO TABS
1.0000 | ORAL_TABLET | ORAL | Status: DC
  Administered 2017-05-14 – 2017-05-15 (×3): 1 via ORAL
  Filled 2017-05-14 (×4): qty 1

## 2017-05-14 MED ORDER — DIPHENHYDRAMINE HCL 25 MG PO CAPS: 25.0000 mg | ORAL_CAPSULE | Freq: Every evening | ORAL | Status: DC | PRN

## 2017-05-14 MED ORDER — DOCUSATE SODIUM 100 MG PO CAPS
100.0000 mg | ORAL_CAPSULE | Freq: Four times a day (QID) | ORAL | Status: DC
  Administered 2017-05-14: 100 mg via ORAL
  Filled 2017-05-14 (×3): qty 1

## 2017-05-14 MED ORDER — ONDANSETRON HCL 4 MG/2ML IJ SOLN: 4.0000 mg | Freq: Four times a day (QID) | INTRAMUSCULAR | Status: DC | PRN

## 2017-05-14 MED ORDER — DONEPEZIL HCL 5 MG PO TABS
10.0000 mg | ORAL_TABLET | Freq: Every day | ORAL | Status: DC
  Administered 2017-05-14: 10 mg via ORAL
  Filled 2017-05-14: qty 1
  Filled 2017-05-14: qty 2

## 2017-05-14 MED ORDER — METOPROLOL TARTRATE 25 MG PO TABS: 12.5000 mg | ORAL_TABLET | Freq: Two times a day (BID) | ORAL | Status: DC

## 2017-05-14 MED ORDER — CARBIDOPA-LEVODOPA 25-250 MG PO TABS
2.0000 | ORAL_TABLET | Freq: Once | ORAL | Status: DC
  Filled 2017-05-14: qty 2

## 2017-05-14 NOTE — ED Notes (Signed)
PT using urinal

## 2017-05-14 NOTE — ED Notes (Signed)
Nurse unable to take report at this time.

## 2017-05-14 NOTE — H&P (Signed)
[place] [facility] [place] NAME: Lucas Edwards    MR#:  [id number]  DATE OF BIRTH:  [birth year]  DATE OF ADMISSION:  05/14/2017  PRIMARY CARE PHYSICIAN: Dion Body, MD   REQUESTING/REFERRING PHYSICIAN:  schavitz  CHIEF COMPLAINT:   Chest pain HISTORY OF PRESENT ILLNESS:  Lucas Edwards  is a 67 y.o. male with a known history of hypertension,  Parkinson's disease with chronic tremors, GERD is presenting to the ED with a chief complaint of chest pain.  Chest pain is associated with lightheadedness but feels like some something is sitting on his chest and the pain is radiating to his back.  Whenever patient is feeling dizzy he was noticing black spots but he did not pass out.  First blood pressure on the scene noticed by EMS was at 5s.  Patient was given aspirin, IV fluids and brought into the ED.  Initial troponin is negative but second troponin at 0.06.  Telemetry strip with anterior lateral leads ST depressions but a repeat EKG with artifact.  Patient reported that he was seen by Dr. Cathie Olden in [place] several years ago and had cardiac catheterization.  Patient is resting comfortably during my examination and denies any chest pain or dizziness  PAST MEDICAL HISTORY:   Past Medical History:  Diagnosis Date  . Abdominal hernia   . GERD (gastroesophageal reflux disease)   . History of hypertension   . Hyperlipidemia   . Parkinson disease ([place])     PAST SURGICAL HISTOIRY:   Past Surgical History:  Procedure Laterality Date  . INGUINAL HERNIA REPAIR Bilateral   . VASECTOMY      SOCIAL HISTORY:   Social History   Tobacco Use  . Smoking status: Former Research scientist (life sciences)  . Tobacco comment: during high school/college  Substance Use Topics  . Alcohol use: Yes    Comment: social     FAMILY HISTORY:  No family history on file.  DRUG ALLERGIES:   Allergies  Allergen Reactions  . Sulfa Antibiotics Hives  . Tylenol  [Acetaminophen] Itching    REVIEW OF SYSTEMS:  CONSTITUTIONAL: No fever, fatigue or weakness.  EYES: No blurred or double vision.  EARS, NOSE, AND THROAT: No tinnitus or ear pain.  RESPIRATORY: No cough, shortness of breath, wheezing or hemoptysis.  CARDIOVASCULAR: No chest pain, orthopnea, edema.  GASTROINTESTINAL: No nausea, vomiting, diarrhea or abdominal pain.  GENITOURINARY: No dysuria, hematuria.  ENDOCRINE: No polyuria, nocturia,  HEMATOLOGY: No anemia, easy bruising or bleeding SKIN: No rash or lesion. MUSCULOSKELETAL: No joint pain or arthritis.   NEUROLOGIC: No tingling, numbness, weakness.  PSYCHIATRY: No anxiety or depression.   MEDICATIONS AT HOME:   Prior to Admission medications   Medication Sig Start Date End Date Taking? Authorizing Provider  carbidopa-levodopa (SINEMET IR) 25-100 MG tablet Take 2 tablets by mouth. TAKE 2 TABLETS BY MOUTH SIX TIMES DAILY AND TAKE 1 ADDITIONAL TABLET BY MOUTH AT BEDTIME   Yes [provider]  diphenhydrAMINE (BENADRYL) 25 mg capsule Take 25 mg by mouth at bedtime as needed.   Yes [provider]  docusate sodium (COLACE) 100 MG capsule Take 100 mg by mouth 4 (four) times daily.    Yes [provider]  donepezil (ARICEPT) 10 MG tablet Take 10 mg by mouth daily.    Yes [provider]  entacapone (COMTAN) 200 MG tablet Take 200 mg by mouth 4 (four) times daily.   Yes [provider]  pantoprazole (PROTONIX) 40 MG  tablet Take 40 mg by mouth daily.  10/21/13  Yes [provider]  clonazePAM (KLONOPIN) 0.5 MG tablet TAKE 1 TABLET BY MOUTH AT BEDTIME Patient not taking: Reported on 05/14/2017 07/01/15   Tat, Eustace Quail, DO  cyclobenzaprine (FLEXERIL) 5 MG tablet TAKE 1 TABLET BY MOUTH EVERY NIGHT AS NEEDED FOR MUSCLE SPASM Patient not taking: Reported on 05/14/2017 12/18/13   Tat, Eustace Quail, DO      VITAL SIGNS:  Blood pressure 131/79, pulse 81, temperature 97.6 F (36.4 C), temperature  source Oral, resp. rate (!) 21, height 5\' 9"  (1.753 m), weight 63.5 kg (140 lb), SpO2 97 %.  PHYSICAL EXAMINATION:  GENERAL:  67 y.o.-year-old patient lying in the bed with no acute distress.  EYES: Pupils equal, round, reactive to light and accommodation. No scleral icterus. Extraocular muscles intact.  HEENT: Head atraumatic, normocephalic. Oropharynx and nasopharynx clear.  NECK:  Supple, no jugular venous distention. No thyroid enlargement, no tenderness.  LUNGS: Normal breath sounds bilaterally, no wheezing, rales,rhonchi or crepitation. No use of accessory muscles of respiration.  CARDIOVASCULAR: S1, S2 normal. No murmurs, rubs, or gallops.  ABDOMEN: Soft, nontender, nondistended. Bowel sounds present. No organomegaly or mass.  EXTREMITIES: No pedal edema, cyanosis, or clubbing.  NEUROLOGIC: Cranial nerves II through XII are intact. Muscle strength 5/5 in all extremities. Sensation intact. Gait not checked.  Chronic tremors PSYCHIATRIC: The patient is alert and oriented x 3.  SKIN: No obvious rash, lesion, or ulcer.   LABORATORY PANEL:   CBC Recent Labs  Lab 05/14/17 1046  WBC 7.4  HGB 15.6  HCT 45.6  PLT 215   ------------------------------------------------------------------------------------------------------------------  Chemistries  Recent Labs  Lab 05/14/17 1046  NA 139  K 3.9  CL 102  CO2 28  GLUCOSE 92  BUN 9  CREATININE 1.08  CALCIUM 8.9   ------------------------------------------------------------------------------------------------------------------  Cardiac Enzymes Recent Labs  Lab 05/14/17 1345  TROPONINI 0.06*   ------------------------------------------------------------------------------------------------------------------  RADIOLOGY:  No results found.  EKG:   Orders placed or performed during the hospital encounter of 05/14/17  . EKG 12-Lead  . EKG 12-Lead  . EKG 12-Lead (at 6am)    IMPRESSION AND PLAN:   Lucas Edwards  is a  67 y.o. male with a known history of hypertension,  Parkinson's disease with chronic tremors, GERD is presenting to the ED with a chief complaint of chest pain.  Chest pain is associated with lightheadedness but feels like some something is sitting on his chest and the pain is radiating to his back.  Whenever patient is feeling dizzy he was noticing black spots but he did not pass out.  First blood pressure on the scene noticed by EMS was at 7s.  #Unstable angina   admit to telemetry Initial troponin is negative but repeat troponin at 0.06 Telemetry strip with anterior lateral leads ST depressions.  EKG with an artifact Will obtain echocardiogram Cycle cardiac biomarkers Cardiology consulted Aspirin Holding beta-blocker as patient is feeling dizzy and blood pressure was low when he arrived Check fasting lipid panel   #Chronic Parkinson's disease Continue patient Sinemet IR 2 tablets at 8 AM, 11 AM , 2 PM, 5 PM, 8 PM and 1 tablet at 11 PM, 2 AM and 5 AM Continue Comtan Outpatient follow-up with neurology [name]  #GERD continue Protonix   #hypertension Hold blood pressure medications as patient was found to be hypotensive initially    DVT prophylaxis with Lovenox   All the records are reviewed and case discussed with  ED provider. Management plans discussed with the patient, family and they are in agreement.  CODE STATUS: dnr  TOTAL TIME TAKING CARE OF THIS PATIENT: 43 minutes.   Note: This dictation was prepared with Dragon dictation along with smaller phrase technology. Any transcriptional errors that result from this process are unintentional.  Nicholes Mango M.D on 05/14/2017 at 5:00 PM  Between 7am to 6pm - Pager - [phone]  After 6pm go to [email or web address] - password [email or web address] [facility]  Office  [phone]  CC: Primary care physician; Dion Body, MD

## 2017-05-14 NOTE — Consult Note (Signed)
Cardiology Consult    Patient ID: Lucas Edwards MRN: [id number], DOB/AGE: 11/16/1950   Admit date: 05/14/2017 Date of Consult: 05/14/2017  Primary Physician: Dion Body, MD Primary Cardiologist: Prev seen by Joaquim Nam, MD in 2001 Requesting Provider: Ricka Burdock, MD  Patient Profile    Lucas Edwards is a 67 y.o. male with a history of Parkinson's disease, hypertension, hyperlipidemia, and GERD, who is being seen today for the evaluation of chest pain and presyncope at the request of Dr. Margaretmary Eddy.  Past Medical History   Past Medical History:  Diagnosis Date  . Abdominal hernia   . Chest pain    a. 2001 Cath:  nl cors per pt.  Marland Kitchen GERD (gastroesophageal reflux disease)   . History of hypertension   . Hyperlipidemia   . Parkinson disease [facility])     Past Surgical History:  Procedure Laterality Date  . INGUINAL HERNIA REPAIR Bilateral   . VASECTOMY       Allergies  Allergies  Allergen Reactions  . Sulfa Antibiotics Hives  . Tylenol [Acetaminophen] Itching    History of Present Illness    67 year old ? with a history of Parkinson's disease with and GERD.  He also has a reported history of hypertension and hyperlipidemia but is not on therapy for these.  In 2001, he was evaluated by cardiology secondary to chest pain and significant family history of premature CAD with his father dying at 47 of stroke but suffering an MI in his 54s.  Catheterization was performed in 2001 and reportedly showed normal coronary arteries.  He never required intervention.  He has not required cardiology for follow-up since then.  He now lives at [facility].  He suffers from Parkinson's but seems to get along pretty well.  He exercises 1-2 times most days per week.  He participates in some aerobic activities such as a boxing but largely performs fine motor exercises.  He historically has tolerated exercise just fine and has never noted chest pain or dyspnea.  He was in his usual state of  health today when he was running late to his exercise class and ran to the class.  When he arrived, he started exercising and started doing some toe lifts and began to experience lightheadedness and blurring of his vision.  He felt like he might pass out.  He sat down in the exercise room and noted intermittent blackening of his vision.  He was not having chest pain, dyspnea, diaphoresis, or nausea.  He then got up and left the building and walked to the nurses station in another building.  While walking, he felt very lightheaded.  Upon arrival to the nurses station, he remained lightheaded and presyncopal.  He says the nurse was having trouble getting his blood pressure.  While sitting there, he developed mild chest tightness without associated symptoms.  This lasted a few minutes and resolve spontaneously.  EMTs arrived and gave him aspirin but by that point, he was already chest pain-free.  His blood pressure was found to be in the 60s on EMS arrival and he was treated with a 150 mL bolus.  He was taken to the [facility] ED where ECG was nonacute.  CBC and basic metabolic panel within normal limits however, troponin mildly elevated at 0.06.  He is currently symptom-free.  We have been asked to evaluate.  Home Medications    Prior to Admission medications   Medication Sig Start Date End Date Taking? Authorizing Provider  carbidopa-levodopa (SINEMET  IR) 25-100 MG tablet Take 2 tablets by mouth. TAKE 2 TABLETS BY MOUTH SIX TIMES DAILY AND TAKE 1 ADDITIONAL TABLET BY MOUTH AT BEDTIME   Yes [provider]  diphenhydrAMINE (BENADRYL) 25 mg capsule Take 25 mg by mouth at bedtime as needed.   Yes [provider]  docusate sodium (COLACE) 100 MG capsule Take 100 mg by mouth 4 (four) times daily.    Yes [provider]  donepezil (ARICEPT) 10 MG tablet Take 10 mg by mouth daily.    Yes [provider]  entacapone (COMTAN) 200 MG tablet Take 200 mg by mouth 4 (four) times daily.    Yes [provider]  pantoprazole (PROTONIX) 40 MG tablet Take 40 mg by mouth daily.  10/21/13  Yes [provider]  clonazePAM (KLONOPIN) 0.5 MG tablet TAKE 1 TABLET BY MOUTH AT BEDTIME Patient not taking: Reported on 05/14/2017 07/01/15   Tat, Eustace Quail, DO  cyclobenzaprine (FLEXERIL) 5 MG tablet TAKE 1 TABLET BY MOUTH EVERY NIGHT AS NEEDED FOR MUSCLE SPASM Patient not taking: Reported on 05/14/2017 12/18/13   Tat, Eustace Quail, DO      Family History    Family History  Problem Relation Age of Onset  . Multiple sclerosis Mother        died @ 72  . Stroke Father        died @ 38  . Heart attack Father   . Lung cancer Sister        died in her 91's   indicated that his mother is deceased. He indicated that his father is deceased. He indicated that his sister is deceased. He indicated that his brother is deceased. He indicated that his maternal grandmother is deceased.   Social History    Social History   Socioeconomic History  . Marital status: Widowed    Spouse name: Not on file  . Number of children: Not on file  . Years of education: Not on file  . Highest education level: Not on file  Social Needs  . Financial resource strain: Not on file  . Food insecurity - worry: Not on file  . Food insecurity - inability: Not on file  . Transportation needs - medical: Not on file  . Transportation needs - non-medical: Not on file  Occupational History  . Occupation: disabled  Tobacco Use  . Smoking status: Former Research scientist (life sciences)  . Tobacco comment: during high school/college  Substance and Sexual Activity  . Alcohol use: Yes    Comment: rare - binges about once/yr or less.  . Drug use: No    Comment: marijuana occ  . Sexual activity: No  Other Topics Concern  . Not on file  Social History Narrative   Lives @ [facility].  Exercises most days of the week.     Review of Systems    General:  No chills, fever, night sweats or weight changes.  Cardiovascular:  +++ mild  chest tightness, +++ presyncope, no dyspnea on exertion, edema, orthopnea, palpitations, paroxysmal nocturnal dyspnea. Dermatological: No rash, lesions/masses Respiratory: No cough, dyspnea Urologic: No hematuria, dysuria Abdominal:   No nausea, vomiting, diarrhea, bright red blood per rectum, melena, or hematemesis Neurologic:  No visual changes, wkns, changes in mental status. All other systems reviewed and are otherwise negative except as noted above.  Physical Exam    Blood pressure 131/79, pulse 81, temperature 97.6 F (36.4 C), temperature source Oral, resp. rate (!) 21, height 5\' 9"  (1.753 m), weight  140 lb (63.5 kg), SpO2 97 %.  General: Pleasant, NAD Psych: Normal affect. Neuro: Alert and oriented X 3. Moves all extremities spontaneously. Resting tremor. HEENT: Normal  Neck: Supple without bruits or JVD. Lungs:  Resp regular and unlabored, CTA. Heart: RRR no s3, s4, or murmurs. Abdomen: Soft, non-tender, non-distended, BS + x 4.  Extremities: No clubbing, cyanosis or edema. DP/PT/Radials 2+ and equal bilaterally.  Labs   Recent Labs    05/14/17 1046 05/14/17 1345  TROPONINI <0.03 0.06*   Lab Results  Component Value Date   WBC 7.4 05/14/2017   HGB 15.6 05/14/2017   HCT 45.6 05/14/2017   MCV 89.5 05/14/2017   PLT 215 05/14/2017    Recent Labs  Lab 05/14/17 1046  NA 139  K 3.9  CL 102  CO2 28  BUN 9  CREATININE 1.08  CALCIUM 8.9  GLUCOSE 92    Radiology Studies    No results found.  ECG & Cardiac Imaging    RSR, 94, baseline artifact  Assessment & Plan    1.  Presyncope/hypotension: Patient developed presyncope with blackening of vision after running into his exercise class.  He did not initially have symptoms while running but developed symptoms while in the class.  He then walked to the nurses station another building and symptoms persisted.  He was found to be hypotensive with a blood pressure in the 60s and did respond well to IV fluids.  Here,  ECG is nonacute though there is significant baseline artifact.  Troponin elevated at 0.06.  He is currently asymptomatic and hemodynamically stable.  I suspect his Parkinson's may be playing a role in hypotension/orthostasis.  Continue to trend troponin.  Check echo.  Follow telemetry.  Parkinson's management per internal medicine.  2.  Elevated troponin/chest tightness: Patient reported mild chest tightness lasting about 5 minutes and resolving spontaneously while he was sitting at the nurses station at [facility].  This occurred in the setting of  presyncope and hypotension.  Troponin is 0.06.  He denies any prior history of chest pain or dyspnea with exercise, which she participates in several times per week.  He does have a family history of premature CAD.  He had a catheterization in 2001 which was reportedly normal.  Continue to cycle troponin.  Follow-up echocardiogram.  If echo shows normal LV function  and troponin trend remains flat, we would likely defer ischemic evaluation to the outpatient setting.  If however he more formally rules in, he will need to be heparinized with plan for catheterization on Monday.  Catheterization would likely be difficult in the setting of his significant resting tremor.  Continue aspirin.  Signed, Murray Hodgkins, NP 05/14/2017, 5:15 PM  For questions or updates, please contact   Please consult [email or web address] for contact info under Cardiology/STEMI.

## 2017-05-14 NOTE — ED Notes (Signed)
Cardiologist at bedside.  

## 2017-05-14 NOTE — ED Triage Notes (Signed)
PT arrives via EMS from [facility]. PT reports he was late getting to his exercise class so ran across the parking lot when he began feeling tightness in his chest as well as dizzy. EMS reports a bp of 60 upon arrival. Given 129ml bolus and 324 asa. Pt alert and oriented on arrival, stable vs.

## 2017-05-14 NOTE — ED Notes (Signed)
Date and time results received: 05/14/17 1543   Test: troponin  Critical Value: 0.06  Name of Provider Notified: Jacqualine Code

## 2017-05-14 NOTE — ED Provider Notes (Signed)
[facility] Emergency Department Provider Note  ____________________________________________   First MD Initiated Contact with Patient 05/14/17 1050     (approximate)  I have reviewed the triage vital signs and the nursing notes.   HISTORY  Chief Complaint Chest Pain    HPI Lucas Edwards is a 67 y.o. male Parkinson's disease and hypertension  Patient has woke up this morning, felt fine, was going to exercise class and was running which she reports "Parkinson's patients do not usually do".  As he was running across the parking lot of the begin to feel very lightheaded and had chest pain which she described like a feeling of something sitting on top of his chest, radiated to his neck, he went to the nurse's office where he started feeling lightheaded and as though he was going to pass out and was seeing "black spots".  They were unable to obtain a blood pressure there, EMS reports his first blood pressure on scene was in the 60s, they started him on aspirin 324, IV fluids, and reports that he has improved.  At present time his chest pain is resolved, he feels much improved.  He does report he is thought he is been "dehydrated" the last few days and is been drinking 3-4 bottles of Gatorade a day.  Denies any associated symptoms.  No fevers no nausea no vomiting no diarrhea.  At the present time reports he feels much better and would like to feel the go home soon.  Past Medical History:  Diagnosis Date  . Abdominal hernia   . History of hypertension   . Parkinson disease ([place])   . Parkinson disease [facility])     Patient Active Problem List   Diagnosis Date Noted  . Unstable angina ([place]) 05/14/2017  . Paralysis agitans ([place]) 10/24/2013  . 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 INGUINAL HERNIA REPAIR Bilateral   . VASECTOMY      Prior to Admission medications   Medication Sig Start Date End Date  Taking? Authorizing Provider  carbidopa-levodopa (SINEMET IR) 25-100 MG tablet Take 2 tablets by mouth. TAKE 2 TABLETS BY MOUTH SIX TIMES DAILY AND TAKE 1 ADDITIONAL TABLET BY MOUTH AT BEDTIME   Yes [provider]  diphenhydrAMINE (BENADRYL) 25 mg capsule Take 25 mg by mouth at bedtime as needed.   Yes [provider]  docusate sodium (COLACE) 100 MG capsule Take 100 mg by mouth 4 (four) times daily.    Yes [provider]  donepezil (ARICEPT) 10 MG tablet Take 10 mg by mouth daily.    Yes [provider]  entacapone (COMTAN) 200 MG tablet Take 200 mg by mouth 4 (four) times daily.   Yes [provider]  pantoprazole (PROTONIX) 40 MG tablet Take 40 mg by mouth daily.  10/21/13  Yes [provider]  clonazePAM (KLONOPIN) 0.5 MG tablet TAKE 1 TABLET BY MOUTH AT BEDTIME Patient not taking: Reported on 05/14/2017 07/01/15   Tat, Eustace Quail, DO  cyclobenzaprine (FLEXERIL) 5 MG tablet TAKE 1 TABLET BY MOUTH EVERY NIGHT AS NEEDED FOR MUSCLE SPASM Patient not taking: Reported on 05/14/2017 12/18/13   Tat, Eustace Quail, DO    Allergies Sulfa antibiotics and Tylenol [acetaminophen]  No family history on file.  Social History Social History   Tobacco Use  . Smoking status: Former Research scientist (life sciences)  . Tobacco comment: during high school/college  Substance Use Topics  . Alcohol use: Yes  Comment: social   . Drug use: No    Comment: marijuana occ    Review of Systems Constitutional: No fever/chills Eyes: No visual changes. ENT: No sore throat. Cardiovascular: See HPI respiratory: Denies shortness of breath. Gastrointestinal: No abdominal pain.  No nausea, no vomiting.  No diarrhea.  No constipation. Genitourinary: Negative for dysuria. Musculoskeletal: Negative for back pain. Skin: Negative for rash. Neurological: Negative for headaches, focal weakness or numbness.   ____________________________________________   PHYSICAL EXAM:  VITAL SIGNS: ED  Triage Vitals  Enc Vitals Group     BP 05/14/17 1044 134/79     Pulse Rate 05/14/17 1044 93     Resp 05/14/17 1044 18     Temp --      Temp src --      SpO2 05/14/17 1044 100 %     Weight 05/14/17 1043 140 lb (63.5 kg)     Height 05/14/17 1043 5\' 9"  (1.753 m)     Head Circumference --      Peak Flow --      Pain Score --      Pain Loc --      Pain Edu? --      Excl. in [place]? --     Constitutional: Alert and oriented. Well appearing and in no acute distress.  Parkinsonian face.  Pill-rolling tremor.  He is very pleasant.   Eyes: Conjunctivae are normal. Head: Atraumatic. Nose: No congestion/rhinnorhea. Mouth/Throat: Mucous membranes are moist. Neck: No stridor.   Cardiovascular: Normal rate, regular rhythm. Grossly normal heart sounds.  Good peripheral circulation. Respiratory: Normal respiratory effort.  No retractions. Lungs CTAB. Gastrointestinal: Soft and nontender. No distention. Musculoskeletal: No lower extremity tenderness nor edema. Neurologic:  Normal speech and language. No gross focal neurologic deficits are appreciated.  Skin:  Skin is warm, dry and intact. No rash noted. Psychiatric: Mood and affect are normal. Speech and behavior are normal.  ____________________________________________   LABS (all labs ordered are listed, but only abnormal results are displayed)  Labs Reviewed  TROPONIN I - Abnormal; Notable for the following components:      Result Value   Troponin I 0.06 (*)    All other components within normal limits  CBC  BASIC METABOLIC PANEL  TROPONIN I   ____________________________________________  EKG  Reviewed enterotomy at 10:44 AM Heart rate 95 Cures 100 QTC 430 Normal sinus rhythm, no evidence of acute ischemia or ectopy, artifact effect is present secondary to his parkinsonian tremor  Of note, his EMS EKG is also reviewed by me, demonstrates irregular appearance, baseline artifact hard to tell the exact rhythm, but subtle but appear  to be present anterolateral ST abnormality (depression) is noted on the EMS EKG from 10:08 AM. No ST depression > 21mm. No ST elevation ____________________________________________  RADIOLOGY    ____________________________________________   PROCEDURES  Procedure(s) performed: None  Procedures  Critical Care performed: No  ____________________________________________   INITIAL IMPRESSION / ASSESSMENT AND PLAN / ED COURSE  Pertinent labs & imaging results that were available during my care of the patient were reviewed by me and considered in my medical decision making (see chart for details).  Differential diagnosis includes, but is not limited to, ACS, aortic dissection, pulmonary embolism, cardiac tamponade, pneumothorax, pneumonia, pericarditis, myocarditis, GI-related causes including esophagitis/gastritis, and musculoskeletal chest wall pain.  Patient appears to have had a brief episode of hypotension, reports he has history of random times where his blood pressure drops he becomes lightheaded.  This seems  similar but he today experienced notable chest pain with it.  His EKG did demonstrate what appears to be a reversible ischemia, currently normalized and pain-free after aspirin and fluids.  I offered him IV fluid hydration, he reports that he thinks he is well hydrated now does not wish for any IV fluids at this time unless his blood work is abnormal.  Patient reports he has never stated they in the hospital, he does not think he would want to stay today either, I showed him his EKGs, discussed with him my concerns about the possibility that he could have had poor blood flow to a portion of his heart during this event and notified him that at some ways he almost give himself his own "stress test".  He understands, is amenable to waiting for blood work and further observation.  Currently pain-free.     ----------------------------------------- 12:42 PM on  05/14/2017 -----------------------------------------  Patient currently asymptomatic, but is requesting his Parkinson's medication with I have ordered for him.  Will provide this, he reports no further chest pain he feels well but does need his Sinemet.  Discussed with charge nurse, working to get the patient his needed medicine at this time.  Discussed with Mr. Loden, I offered him admission to the hospital for further testing as I was concerned about his heart and the chest pain he had along with low blood pressure and almost passing out.  However, after discussing at length admission, the patient refuses reports that he feels much more comfortable at home, manages his medications better there, and is currently not having pain.  He is however, amenable to staying for second troponin.  If this is normal the plan will be for [place] to discharge him, with close cardiology follow-up.  ----------------------------------------- 3:57 PM on 05/14/2017 -----------------------------------------  Patient second troponin elevated 0.06.  Patient remains pain-free, discussed with him as well as [name], who he identifies is someone who assist him with his medical affairs.  He is agreeable at this time to be admitted to the hospital for further cardiac workup.  He remains pain-free, his EKG in the ER showed resolution of T wave abnormality, but he is at high risk for coronary disease based upon this.  discussed with Dr. Margaretmary Eddy, patient will be admitted to the hospital for further workup.  I have ordered his next dose of carbidopa which she reports she is due for, and also discussed with the hospitalist that the patient would like the opportunity to speak with him and [facility] [place] regarding his medications to assure that there given appropriately and on time.  ----------------------------------------- 4:12 PM on 05/14/2017 -----------------------------------------  Dr. Fletcher Anon called, consult placed with cardiology,  advises will see in consult in ED this evening.  ____________________________________________   FINAL CLINICAL IMPRESSION(S) / ED DIAGNOSES  Final diagnoses:  Chest pain with high risk for cardiac etiology      NEW MEDICATIONS STARTED DURING THIS VISIT:  New Prescriptions   No medications on file     Note:  This document was prepared using Dragon voice recognition software and may include unintentional dictation errors.     Delman Kitten, MD 05/14/17 [phone]

## 2017-05-14 NOTE — Progress Notes (Signed)
Family Meeting Note  Advance Directive:yes  Today a meeting took place with the Patient, niece [name] over phone at bedside    The following clinical team members were present during this meeting:MD  The following were discussed:Patient's diagnosis: Unstable angina Parkinson's disease with tremor, essential hypertension patient's progosis: Unable to determine and Goals for treatment: DNR,niece Quintin Alto   Additional follow-up to be provided: Hospitalist and cardiologist  Time spent during discussion:17 MIN  Nicholes Mango, MD

## 2017-05-15 ENCOUNTER — Observation Stay (HOSPITAL_BASED_OUTPATIENT_CLINIC_OR_DEPARTMENT_OTHER)
Admit: 2017-05-15 | Discharge: 2017-05-15 | Disposition: A | Discharge status: Home / Self Care | Payer: Medicare Other | Attending: Internal Medicine | Admitting: Internal Medicine

## 2017-05-15 DIAGNOSIS — R079 Chest pain, unspecified: Secondary | ICD-10-CM | POA: Diagnosis not present

## 2017-05-15 DIAGNOSIS — G20 Parkinson's disease: Secondary | ICD-10-CM

## 2017-05-15 DIAGNOSIS — I959 Hypotension, unspecified: Secondary | ICD-10-CM

## 2017-05-15 DIAGNOSIS — R748 Abnormal levels of other serum enzymes: Secondary | ICD-10-CM

## 2017-05-15 DIAGNOSIS — I340 Nonrheumatic mitral (valve) insufficiency: Secondary | ICD-10-CM | POA: Diagnosis not present

## 2017-05-15 DIAGNOSIS — I200 Unstable angina: Secondary | ICD-10-CM | POA: Diagnosis not present

## 2017-05-15 DIAGNOSIS — K219 Gastro-esophageal reflux disease without esophagitis: Secondary | ICD-10-CM | POA: Diagnosis not present

## 2017-05-15 DIAGNOSIS — I10 Essential (primary) hypertension: Secondary | ICD-10-CM | POA: Diagnosis not present

## 2017-05-15 LAB — LIPID PANEL
CHOL/HDL RATIO: 4.7 ratio
Cholesterol: 173 mg/dL (ref 0–200)
HDL: 37 mg/dL — ABNORMAL LOW (ref >40)
LDL CALC: 119 mg/dL — AB (ref 0–99)
Triglycerides: 85 mg/dL (ref <150)
VLDL: 17 mg/dL (ref 0–40)

## 2017-05-15 LAB — ECHOCARDIOGRAM COMPLETE
Height: 69 in
WEIGHTICAEL: 2240 [oz_av]

## 2017-05-15 MED ORDER — CLONAZEPAM 0.5 MG PO TABS: 0.5000 mg | ORAL_TABLET | Freq: Every day | ORAL | 0 refills | Status: DC

## 2017-05-15 NOTE — Progress Notes (Signed)
Mosetta Putt to be D/C'd [place] per MD order. Patient given discharge teaching and paperwork regarding medications, diet, follow-up appointments and activity. Patient understanding verbalized. No questions or complaints at this time. Skin condition as charted. IV and telemetry removed prior to leaving.  No further needs by Care Management/Social Work - packet prepared by CSW, Santiago Glad. Prescription placed in packet   An After Visit Summary was printed and given to the patient. Caregiver/family present during discharge teaching.     Terrilyn Saver

## 2017-05-15 NOTE — Progress Notes (Addendum)
Progress Note  Patient Name: Lucas Edwards Date of Encounter: 05/15/2017  Primary Cardiologist: Kathlyn Sacramento, MD   Subjective   No chest pain.  Inpatient Medications    Scheduled Meds: . aspirin EC  325 mg Oral Daily  . carbidopa-levodopa  1 tablet Oral 3 times per day  . carbidopa-levodopa  2 tablet Oral 5 times per day  . carbidopa-levodopa  2 tablet Oral Once  . docusate sodium  100 mg Oral QID  . donepezil  10 mg Oral Daily  . enoxaparin (LOVENOX) injection  40 mg Subcutaneous Q24H  . entacapone  200 mg Oral QID  . pantoprazole  40 mg Oral Daily   Continuous Infusions:  PRN Meds: acetaminophen, diphenhydrAMINE, ondansetron (ZOFRAN) IV   Vital Signs    Vitals:   05/14/17 1842 05/14/17 1933 05/15/17 0447 05/15/17 0756  BP: 133/80 129/84 (!) 142/85 (!) 149/90  Pulse: 86 88 83 82  Resp: 19 18 17 15   Temp:  (!) 97.5 F (36.4 C) 98.6 F (37 C) 98 F (36.7 C)  TempSrc:  Oral Oral Oral  SpO2: 96% 96% 100% 100%  Weight:      Height:        Intake/Output Summary (Last 24 hours) at 05/15/2017 1253 Last data filed at 05/15/2017 1022 Gross per 24 hour  Intake 240 ml  Output 951 ml  Net -711 ml   Filed Weights   05/14/17 1043  Weight: 140 lb (63.5 kg)    Telemetry    NSR - Personally Reviewed  ECG    NSR, no ST changes - Personally Reviewed  Physical Exam   GEN: No acute distress.   Neck: No JVD Cardiac: RRR, no murmurs, rubs, or gallops.  Respiratory: Clear to auscultation bilaterally. GI: Soft, nontender, non-distended  MS: No edema; No deformity. Neuro:  Nonfocal  Psych: Normal affect   Labs    Chemistry Recent Labs  Lab 05/14/17 1046  NA 139  K 3.9  CL 102  CO2 28  GLUCOSE 92  BUN 9  CREATININE 1.08  CALCIUM 8.9  GFRNONAA >60  GFRAA >60  ANIONGAP 9     Hematology Recent Labs  Lab 05/14/17 1046  WBC 7.4  RBC 5.10  HGB 15.6  HCT 45.6  MCV 89.5  MCH 30.6  MCHC 34.2  RDW 13.6  PLT 215    Cardiac  Enzymes Recent Labs  Lab 05/14/17 1046 05/14/17 1345 05/14/17 1801 05/14/17 2301  TROPONINI <0.03 0.06* 0.08* 0.06*   No results for input(s): TROPIPOC in the last 168 hours.   BNPNo results for input(s): BNP, PROBNP in the last 168 hours.   DDimer No results for input(s): DDIMER in the last 168 hours.   Radiology    No results found.  Cardiac Studies   Echo pending; troponin with a flat trend; I personally reviewed the echo images. Normal LV function, mild MR.  No AS. Await final report.  Patient Profile     67 y.o. male with presyncope; hypotension  Assessment & Plan    1) ELevated troponin.  Flat trend.  DOubt ACS.  ECG this AM is unremarkable.  Await echo result.  If no wall motion abnormality, then no plan for inpatient cardiac w/u.  He states he had a cath with Dr. Acie Fredrickson over 20 years ago that was clean.    BP stable.  Stay well hydrated. Parkinson's with autonomic dysfunction likely contributes to the BP issues.   For questions or updates, please contact  [facility] Please consult [email or web address] for contact info under Cardiology/STEMI.      Signed, Larae Grooms, MD  05/15/2017, 12:53 PM

## 2017-05-15 NOTE — Progress Notes (Signed)
Per Dr. Vianne Bulls, discharge patient back to [facility].

## 2017-05-15 NOTE — Progress Notes (Signed)
Call me with echo results for discharge order.

## 2017-05-15 NOTE — Clinical Social Work Note (Signed)
Clinical Social Work Assessment  Patient Details  Name: Lucas Edwards MRN: [id number] Date of Birth: 1950-04-02  Date of referral:  05/15/17               Reason for consult:  Facility Placement                Permission sought to share information with:  Facility Art therapist granted to share information::  Yes, Verbal Permission Granted  Name::        Agency::     Relationship::     Contact Information:     Housing/Transportation Living arrangements for the past 2 months:  [place] of Information:  Patient, Medical Team, Facility Patient Interpreter Needed:  None Criminal Activity/Legal Involvement Pertinent to Current Situation/Hospitalization:  No - Comment as needed Significant Relationships:  Adult Children, Community Support Lives with:  Facility Resident Do you feel safe going back to the place where you live?  Yes Need for family participation in patient care:  No (Coment)  Care giving concerns: Patient admitted from ALF   Social Worker assessment / plan: CSW received a verbal consult via telephone that this patient admitted from [employer] and will discharge back to his facility today. The patient plans on having family transport. CSW will deliver the discharge packet once the discharge summary and FL 2 are available.     Employment status:  Retired Forensic scientist:  Medicare PT Recommendations:  Not assessed at this time Information / Referral to community resources:      Patient/Family's Response to care:  The patient thanked the CSW.  Patient/Family's Understanding of and Emotional Response to Diagnosis, Current Treatment, and Prognosis:  The patient is in agreement with return to his facility.  Emotional Assessment Appearance:  Appears stated age Attitude/Demeanor/Rapport:  Engaged Affect (typically observed):  Appropriate, Pleasant Orientation:  Oriented to Self, Oriented to Place, Oriented to  Time,  Oriented to Situation Alcohol / Substance use:  Never Used Psych involvement (Current and /or in the community):  No (Comment)  Discharge Needs  Concerns to be addressed:  Care Coordination, Discharge Planning Concerns Readmission within the last 30 days:  No Current discharge risk:  None Barriers to Discharge:  No Barriers Identified   Zettie Pho, LCSW 05/15/2017, 2:52 PM

## 2017-05-15 NOTE — Evaluation (Signed)
Clinical/Bedside Swallow Evaluation Patient Details  Name: Lucas Edwards MRN: [id number] Date of Birth: Sep 08, 1950  Today's Date: 05/15/2017 Time: SLP Start Time (ACUTE ONLY): 0935 SLP Stop Time (ACUTE ONLY): 1002 SLP Time Calculation (min) (ACUTE ONLY): 27 min  Past Medical History:  Past Medical History:  Diagnosis Date  . Abdominal hernia   . Chest pain    a. 2001 Cath:  nl cors per pt.  Marland Kitchen GERD (gastroesophageal reflux disease)   . History of hypertension   . Hyperlipidemia   . Parkinson disease [facility])    Past Surgical History:  Past Surgical History:  Procedure Laterality Date  . INGUINAL HERNIA REPAIR Bilateral   . VASECTOMY     HPI:      Assessment / Plan / Recommendation Clinical Impression  pt presents with no significant dysphagia as characterized by pt WFL for mastication and intake. pt was able to feed himself independently. pt had no overt ssx of aspiration including no watery eyes, nose or wet vocal quality. pt did state he often gets dry mouth and pills are difficult to swallow. SLP provided education on how to increasse hydration throughout the day to improve secretions in his oral cavity. SLP educated his niece, [name], via phone on slp recommendations to continue with current diet and to increase hydration to improve oral status. SLP also provided education on alternate ways of taking medications to improve intake. pt agreed verbally as well as niece.  SLP Visit Diagnosis: Dysphagia, oral phase (R13.11)    Aspiration Risk  No limitations    Diet Recommendation Regular;Thin liquid   Liquid Administration via: Cup;Straw Medication Administration: Whole meds with liquid Supervision: Patient able to self feed Compensations: Slow rate;Small sips/bites;Follow solids with liquid Postural Changes: Seated upright at 90 degrees    Other  Recommendations Oral Care Recommendations: Patient independent with oral care   Follow up Recommendations         Frequency and Duration min 1 x/week  1 week       Prognosis Prognosis for Safe Diet Advancement: Good      Swallow Study   General Date of Onset: 05/14/17 Type of Study: Bedside Swallow Evaluation Diet Prior to this Study: Regular;Thin liquids Temperature Spikes Noted: N/A Respiratory Status: Room air History of Recent Intubation: No Behavior/Cognition: Alert;Cooperative;Pleasant mood Oral Cavity Assessment: Within Functional Limits Oral Care Completed by SLP: No Oral Cavity - Dentition: Adequate natural dentition Vision: Functional for self-feeding Self-Feeding Abilities: Able to feed self Patient Positioning: Upright in bed Baseline Vocal Quality: Normal Volitional Cough: Strong Volitional Swallow: Able to elicit    Oral/Motor/Sensory Function Overall Oral Motor/Sensory Function: Within functional limits   Ice Chips Ice chips: Within functional limits   Thin Liquid Thin Liquid: Within functional limits    Nectar Thick Nectar Thick Liquid: Within functional limits   Honey Thick Honey Thick Liquid: Within functional limits   Puree Puree: Within functional limits   Solid   GO   Solid: Within functional limits    Functional Limitations: Swallowing Swallow Current Status (S2831): 0 percent impaired, limited or restricted Swallow Goal Status (D1761): 0 percent impaired, limited or restricted   [employer] 05/15/2017,10:53 AM

## 2017-05-15 NOTE — NC FL2 (Signed)
[place] LEVEL OF CARE SCREENING TOOL     IDENTIFICATION  Patient Name: Lucas Edwards Birthdate: Jun 29, 1950 Sex: male Admission Date (Current Location): 05/14/2017  [facility] [place] Number:      Facility and Address:  [facility], [address] [place], [place] [address]      Provider Number: [id number]  Attending Physician Name and Address:  Epifanio Lesches, MD  Relative Name and Phone Number:       Current Level of Care: Hospital Recommended Level of Care: [place] Prior Approval Number:    Date Approved/Denied:   PASRR Number:    Discharge Plan: Domiciliary (Rest home)    Current Diagnoses: Patient Active Problem List   Diagnosis Date Noted  . Unstable angina ([place]) 05/14/2017  . Paralysis agitans ([place]) 10/24/2013  . REM behavioral disorder 10/24/2013    Orientation RESPIRATION BLADDER Height & Weight     Self, Time, Situation, Place  Normal Continent Weight: 140 lb (63.5 kg) Height:  5\' 9"  (175.3 cm)  BEHAVIORAL SYMPTOMS/MOOD NEUROLOGICAL BOWEL NUTRITION STATUS      Continent Diet(Heart healthy)  AMBULATORY STATUS COMMUNICATION OF NEEDS Skin   Supervision Verbally Normal                       Personal Care Assistance Level of Assistance  Bathing, Feeding, Dressing Bathing Assistance: Limited assistance Feeding assistance: Independent Dressing Assistance: Limited assistance     Functional Limitations Info             SPECIAL CARE FACTORS FREQUENCY                       Contractures Contractures Info: Not present    Additional Factors Info  Code Status, Allergies Code Status Info: DNR Allergies Info: Sulfa Antibiotics, Tylenol Acetaminophen           Current Medications (05/15/2017):  This is the current hospital active medication list Current Facility-Administered Medications  Medication Dose Route Frequency Provider Last Rate Last Dose  . acetaminophen  (TYLENOL) tablet 650 mg  650 mg Oral Q4H PRN Gouru, Aruna, MD      . aspirin EC tablet 325 mg  325 mg Oral Daily Gouru, Aruna, MD      . carbidopa-levodopa (SINEMET IR) 25-100 MG per tablet immediate release 1 tablet  1 tablet Oral 3 times per day Nicholes Mango, MD   1 tablet at 05/15/17 0629  . carbidopa-levodopa (SINEMET IR) 25-100 MG per tablet immediate release 2 tablet  2 tablet Oral 5 times per day Nicholes Mango, MD   2 tablet at 05/15/17 1416  . carbidopa-levodopa (SINEMET IR) 25-250 MG per tablet immediate release 2 tablet  2 tablet Oral Once Gouru, Aruna, MD      . diphenhydrAMINE (BENADRYL) capsule 25 mg  25 mg Oral QHS PRN Gouru, Aruna, MD      . docusate sodium (COLACE) capsule 100 mg  100 mg Oral QID Gouru, Aruna, MD   100 mg at 05/14/17 1820  . donepezil (ARICEPT) tablet 10 mg  10 mg Oral Daily Gouru, Aruna, MD   10 mg at 05/14/17 2300  . enoxaparin (LOVENOX) injection 40 mg  40 mg Subcutaneous Q24H Gouru, Aruna, MD   40 mg at 05/14/17 2300  . entacapone (COMTAN) tablet 200 mg  200 mg Oral QID Gouru, Aruna, MD   200 mg at 05/15/17 1416  . ondansetron (ZOFRAN) injection 4 mg  4 mg  Intravenous Q6H PRN Gouru, Aruna, MD      . pantoprazole (PROTONIX) EC tablet 40 mg  40 mg Oral Daily Gouru, Aruna, MD   40 mg at 05/15/17 0959     Discharge Medications: TAKE these medications   carbidopa-levodopa 25-100 MG tablet Commonly known as:  SINEMET IR Take 2 tablets by mouth. TAKE 2 TABLETS BY MOUTH SIX TIMES DAILY AND TAKE 1 ADDITIONAL TABLET BY MOUTH AT BEDTIME   clonazePAM 0.5 MG tablet Commonly known as:  KLONOPIN TAKE 1 TABLET BY MOUTH AT BEDTIME   cyclobenzaprine 5 MG tablet Commonly known as:  FLEXERIL TAKE 1 TABLET BY MOUTH EVERY NIGHT AS NEEDED FOR MUSCLE SPASM   diphenhydrAMINE 25 mg capsule Commonly known as:  BENADRYL Take 25 mg by mouth at bedtime as needed.   docusate sodium 100 MG capsule Commonly known as:  COLACE Take 100 mg by mouth 4 (four) times daily.    donepezil 10 MG tablet Commonly known as:  ARICEPT Take 10 mg by mouth daily.   entacapone 200 MG tablet Commonly known as:  COMTAN Take 200 mg by mouth 4 (four) times daily.   pantoprazole 40 MG tablet Commonly known as:  PROTONIX Take 40 mg by mouth daily.       Relevant Imaging Results:  Relevant Lab Results:   Additional Information    Zettie Pho, LCSW

## 2017-05-15 NOTE — Progress Notes (Signed)
Patient refusing bed alarm. Educated on safety.  

## 2017-05-15 NOTE — Discharge Summary (Signed)
Lucas Edwards, is a 67 y.o. male  DOB 1950-05-20  MRN [id number].  Admission date:  05/14/2017  Admitting Physician  Nicholes Mango, MD  Discharge Date:  05/15/2017   Primary MD  Dion Body, MD  Recommendations for primary care physician for things to follow:   With PCP in 1 week Follow-up with [facility] health cardiology Dr. Fletcher Anon in 2 weeks.   Admission Diagnosis  Chest pain with high risk for cardiac etiology [R07.9]   Discharge Diagnosis  Chest pain with high risk for cardiac etiology [R07.9]   Active Problems:   Unstable angina [facility])      Past Medical History:  Diagnosis Date  . Abdominal hernia   . Chest pain    a. 2001 Cath:  nl cors per pt.  Marland Kitchen GERD (gastroesophageal reflux disease)   . History of hypertension   . Hyperlipidemia   . Parkinson disease [facility])     Past Surgical History:  Procedure Laterality Date  . INGUINAL HERNIA REPAIR Bilateral   . VASECTOMY         History of present illness and  Hospital Course:     Kindly see H&P for history of present illness and admission details, please review complete Labs, Consult reports and Test reports for all details in brief  HPI  from the history and physical done on the day of admission 67 year old male patient with history of essential hypertension, Parkinson disease with chronic tremors, GERD came to the ER because of chest pain.  Patient felt lightheaded when he was running to exercise class.  The blood pressure was in 60s by EMS.  Patient received aspirin for chest pain, received IV fluids, admitted to hospitalist service for evaluation of chest pain.   Hospital Course  #1 atypical chest pain secondary to hypotension.  Troponins have been plateaued around 0.06.  Seen by [facility] health cardiology, patient had echocardiogram which showed EF was  55-60% without any wall motion abnormality.  Patient did not have any further chest pain.  He is eager to go home.  Patient can follow-up with [facility] health cardiology Dr. Fletcher Anon as an outpatient for block. 2.  Slightly elevated troponins, chest tightness.  Likely secondary to presyncopal event with hypotension.  No further chest pain.  Patient is very active does regular exercise at [facility]  Patient can continue aspirin.  Patient does not need any further ischemia workup.  Hypotension improved.  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 3.  Parkinson disease: Sent can continue his Sinemet IR 25/102 tablets every 6 hours, 1 additional tablet at bedtime.  Patient already on on Aricept, Comtan, Klonopin.  He can continue them.  With patient's niece over the phone.    Discharge Condition:stable  Follow UP  Follow-up Information    Dion Body, MD Follow up in 1 week(s).   Specialty:  Family Medicine Contact information: [place] [address] [phone]        [name], MD .   Specialty:  Cardiology Contact information: [place] [address] [phone]             Discharge Instructions  and  Discharge Medications      Allergies as of 05/15/2017      Reactions   Sulfa Antibiotics Hives   Tylenol [acetaminophen] Itching      Medication List    TAKE these medications  carbidopa-levodopa 25-100 MG tablet Commonly known as:  SINEMET IR Take 2 tablets by mouth. TAKE 2 TABLETS BY MOUTH SIX TIMES DAILY AND TAKE 1 ADDITIONAL TABLET BY MOUTH AT BEDTIME   clonazePAM 0.5 MG tablet Commonly known as:  KLONOPIN TAKE 1 TABLET BY MOUTH AT BEDTIME   cyclobenzaprine 5 MG tablet Commonly known as:  FLEXERIL TAKE 1 TABLET BY MOUTH EVERY NIGHT AS NEEDED FOR MUSCLE SPASM   diphenhydrAMINE 25 mg capsule Commonly known as:   BENADRYL Take 25 mg by mouth at bedtime as needed.   docusate sodium 100 MG capsule Commonly known as:  COLACE Take 100 mg by mouth 4 (four) times daily.   donepezil 10 MG tablet Commonly known as:  ARICEPT Take 10 mg by mouth daily.   entacapone 200 MG tablet Commonly known as:  COMTAN Take 200 mg by mouth 4 (four) times daily.   pantoprazole 40 MG tablet Commonly known as:  PROTONIX Take 40 mg by mouth daily.         Diet and Activity recommendation: See Discharge Instructions above   Consults obtained -cardiology   Major procedures and Radiology Reports - PLEASE review detailed and final reports for all details, in brief -     No results found.  Micro Results    Recent Results (from the past 240 hour(s))  MRSA PCR Screening     Status: None   Collection Time: 05/14/17  6:18 PM  Result Value Ref Range Status   MRSA by PCR NEGATIVE NEGATIVE Final    Comment:        The GeneXpert MRSA Assay (FDA approved for NASAL specimens only), is one component of a comprehensive MRSA colonization surveillance program. It is not intended to diagnose MRSA infection nor to guide or monitor treatment for MRSA infections. Performed at [facility], [address] [place], [place] [address]        Today   Subjective:   Lucas Edwards today has no headache,no chest abdominal pain,no new weakness tingling or numbness, feels much better wants to go home today.  Objective:   Blood pressure (!) 149/90, pulse 82, temperature 98 F (36.7 C), temperature source Oral, resp. rate 15, height 5\' 9"  (1.753 m), weight 63.5 kg (140 lb), SpO2 100 %.   Intake/Output Summary (Last 24 hours) at 05/15/2017 1449 Last data filed at 05/15/2017 1426 Gross per 24 hour  Intake 480 ml  Output 951 ml  Net -471 ml    Exam Awake Alert, Oriented x 3, No new F.N deficits, Normal affect [place].AT,PERRAL Supple Neck,No JVD, No cervical lymphadenopathy appriciated.  Symmetrical  Chest wall movement, Good air movement bilaterally, CTAB RRR,No Gallops,Rubs or new Murmurs, No Parasternal Heave +ve B.Sounds, Abd Soft, Non tender, No organomegaly appriciated, No rebound -guarding or rigidity. No Cyanosis, Clubbing or edema, No new Rash or bruise  Data Review   CBC w Diff:  Lab Results  Component Value Date   WBC 7.4 05/14/2017   HGB 15.6 05/14/2017   HGB 15.3 06/24/2013   HCT 45.6 05/14/2017   HCT 45.4 06/24/2013   PLT 215 05/14/2017   PLT 186 06/24/2013   LYMPHOPCT 11 04/25/2015   MONOPCT 7 04/25/2015   EOSPCT 1 04/25/2015   BASOPCT 1 04/25/2015    CMP:  Lab Results  Component Value Date   NA 139 05/14/2017   NA 137 06/24/2013   K 3.9 05/14/2017   K 3.7 06/24/2013   CL 102 05/14/2017   CL 105 06/24/2013  CO2 28 05/14/2017   CO2 29 06/24/2013   BUN 9 05/14/2017   BUN 14 06/24/2013   CREATININE 1.08 05/14/2017   CREATININE 1.07 06/24/2013   PROT 7.5 04/25/2015   PROT 8.1 06/24/2013   ALBUMIN 4.8 04/25/2015   ALBUMIN 4.1 06/24/2013   BILITOT 0.9 04/25/2015   BILITOT 0.8 06/24/2013   ALKPHOS 48 04/25/2015   ALKPHOS 94 06/24/2013   AST 16 04/25/2015   AST 29 06/24/2013   ALT <5 (L) 04/25/2015   ALT < 6 (L) 06/24/2013  .   Total Time in preparing paper work, data evaluation and todays exam - 35 minutes  Epifanio Lesches M.D on 05/15/2017 at 2:49 PM    Note: This dictation was prepared with Dragon dictation along with smaller phrase technology. Any transcriptional errors that result from this process are unintentional.

## 2017-05-16 LAB — HIV ANTIBODY (ROUTINE TESTING W REFLEX): HIV Screen 4th Generation wRfx: NONREACTIVE

## 2017-05-19 DIAGNOSIS — L821 Other seborrheic keratosis: Secondary | ICD-10-CM | POA: Diagnosis not present

## 2017-05-19 DIAGNOSIS — D485 Neoplasm of uncertain behavior of skin: Secondary | ICD-10-CM | POA: Diagnosis not present

## 2017-05-19 DIAGNOSIS — Z1283 Encounter for screening for malignant neoplasm of skin: Secondary | ICD-10-CM | POA: Diagnosis not present

## 2017-05-19 DIAGNOSIS — L578 Other skin changes due to chronic exposure to nonionizing radiation: Secondary | ICD-10-CM | POA: Diagnosis not present

## 2017-05-19 DIAGNOSIS — L820 Inflamed seborrheic keratosis: Secondary | ICD-10-CM | POA: Diagnosis not present

## 2017-05-19 DIAGNOSIS — L720 Epidermal cyst: Secondary | ICD-10-CM | POA: Diagnosis not present

## 2017-05-19 DIAGNOSIS — L812 Freckles: Secondary | ICD-10-CM | POA: Diagnosis not present

## 2017-05-19 DIAGNOSIS — D229 Melanocytic nevi, unspecified: Secondary | ICD-10-CM | POA: Diagnosis not present

## 2017-05-19 DIAGNOSIS — C4442 Squamous cell carcinoma of skin of scalp and neck: Secondary | ICD-10-CM | POA: Diagnosis not present

## 2017-05-19 DIAGNOSIS — L570 Actinic keratosis: Secondary | ICD-10-CM | POA: Diagnosis not present

## 2017-05-19 DIAGNOSIS — R32 Unspecified urinary incontinence: Secondary | ICD-10-CM | POA: Diagnosis not present

## 2017-05-19 DIAGNOSIS — M6281 Muscle weakness (generalized): Secondary | ICD-10-CM | POA: Diagnosis not present

## 2017-05-21 DIAGNOSIS — M6281 Muscle weakness (generalized): Secondary | ICD-10-CM | POA: Diagnosis not present

## 2017-05-21 DIAGNOSIS — R32 Unspecified urinary incontinence: Secondary | ICD-10-CM | POA: Diagnosis not present

## 2017-05-24 DIAGNOSIS — R32 Unspecified urinary incontinence: Secondary | ICD-10-CM | POA: Diagnosis not present

## 2017-05-24 DIAGNOSIS — M6281 Muscle weakness (generalized): Secondary | ICD-10-CM | POA: Diagnosis not present

## 2017-05-26 DIAGNOSIS — M6281 Muscle weakness (generalized): Secondary | ICD-10-CM | POA: Diagnosis not present

## 2017-05-26 DIAGNOSIS — R32 Unspecified urinary incontinence: Secondary | ICD-10-CM | POA: Diagnosis not present

## 2017-05-28 DIAGNOSIS — M6281 Muscle weakness (generalized): Secondary | ICD-10-CM | POA: Diagnosis not present

## 2017-05-28 DIAGNOSIS — R32 Unspecified urinary incontinence: Secondary | ICD-10-CM | POA: Diagnosis not present

## 2017-05-31 DIAGNOSIS — R32 Unspecified urinary incontinence: Secondary | ICD-10-CM | POA: Diagnosis not present

## 2017-05-31 DIAGNOSIS — M6281 Muscle weakness (generalized): Secondary | ICD-10-CM | POA: Diagnosis not present

## 2017-06-04 DIAGNOSIS — R32 Unspecified urinary incontinence: Secondary | ICD-10-CM | POA: Diagnosis not present

## 2017-06-04 DIAGNOSIS — M6281 Muscle weakness (generalized): Secondary | ICD-10-CM | POA: Diagnosis not present

## 2017-06-07 DIAGNOSIS — R32 Unspecified urinary incontinence: Secondary | ICD-10-CM | POA: Diagnosis not present

## 2017-06-07 DIAGNOSIS — M6281 Muscle weakness (generalized): Secondary | ICD-10-CM | POA: Diagnosis not present

## 2017-06-09 DIAGNOSIS — Z87898 Personal history of other specified conditions: Secondary | ICD-10-CM | POA: Diagnosis not present

## 2017-06-09 DIAGNOSIS — R32 Unspecified urinary incontinence: Secondary | ICD-10-CM | POA: Diagnosis not present

## 2017-06-09 DIAGNOSIS — I959 Hypotension, unspecified: Secondary | ICD-10-CM | POA: Diagnosis not present

## 2017-06-09 DIAGNOSIS — M6281 Muscle weakness (generalized): Secondary | ICD-10-CM | POA: Diagnosis not present

## 2017-06-09 DIAGNOSIS — Z1211 Encounter for screening for malignant neoplasm of colon: Secondary | ICD-10-CM | POA: Diagnosis not present

## 2017-06-14 DIAGNOSIS — R413 Other amnesia: Secondary | ICD-10-CM | POA: Diagnosis not present

## 2017-06-14 DIAGNOSIS — G20 Parkinson's disease: Secondary | ICD-10-CM | POA: Diagnosis not present

## 2017-06-14 DIAGNOSIS — G4711 Idiopathic hypersomnia with long sleep time: Secondary | ICD-10-CM | POA: Diagnosis not present

## 2017-06-14 DIAGNOSIS — R42 Dizziness and giddiness: Secondary | ICD-10-CM | POA: Diagnosis not present

## 2017-06-15 DIAGNOSIS — M6281 Muscle weakness (generalized): Secondary | ICD-10-CM | POA: Diagnosis not present

## 2017-06-15 DIAGNOSIS — R32 Unspecified urinary incontinence: Secondary | ICD-10-CM | POA: Diagnosis not present

## 2017-06-30 DIAGNOSIS — C4491 Basal cell carcinoma of skin, unspecified: Secondary | ICD-10-CM

## 2017-06-30 DIAGNOSIS — D485 Neoplasm of uncertain behavior of skin: Secondary | ICD-10-CM | POA: Diagnosis not present

## 2017-06-30 DIAGNOSIS — L820 Inflamed seborrheic keratosis: Secondary | ICD-10-CM | POA: Diagnosis not present

## 2017-06-30 DIAGNOSIS — L720 Epidermal cyst: Secondary | ICD-10-CM | POA: Diagnosis not present

## 2017-06-30 DIAGNOSIS — C44519 Basal cell carcinoma of skin of other part of trunk: Secondary | ICD-10-CM | POA: Diagnosis not present

## 2017-06-30 DIAGNOSIS — L821 Other seborrheic keratosis: Secondary | ICD-10-CM | POA: Diagnosis not present

## 2017-06-30 DIAGNOSIS — L570 Actinic keratosis: Secondary | ICD-10-CM | POA: Diagnosis not present

## 2017-06-30 DIAGNOSIS — L578 Other skin changes due to chronic exposure to nonionizing radiation: Secondary | ICD-10-CM | POA: Diagnosis not present

## 2017-06-30 HISTORY — DX: Basal cell carcinoma of skin, unspecified: C44.91

## 2017-07-07 DIAGNOSIS — N3281 Overactive bladder: Secondary | ICD-10-CM | POA: Diagnosis not present

## 2017-07-07 DIAGNOSIS — N400 Enlarged prostate without lower urinary tract symptoms: Secondary | ICD-10-CM | POA: Diagnosis not present

## 2017-07-22 DIAGNOSIS — R2681 Unsteadiness on feet: Secondary | ICD-10-CM | POA: Diagnosis not present

## 2017-07-22 DIAGNOSIS — R278 Other lack of coordination: Secondary | ICD-10-CM | POA: Diagnosis not present

## 2017-07-22 DIAGNOSIS — Z9181 History of falling: Secondary | ICD-10-CM | POA: Diagnosis not present

## 2017-07-26 DIAGNOSIS — Z9181 History of falling: Secondary | ICD-10-CM | POA: Diagnosis not present

## 2017-07-26 DIAGNOSIS — R278 Other lack of coordination: Secondary | ICD-10-CM | POA: Diagnosis not present

## 2017-07-26 DIAGNOSIS — R2681 Unsteadiness on feet: Secondary | ICD-10-CM | POA: Diagnosis not present

## 2017-07-28 DIAGNOSIS — Z9181 History of falling: Secondary | ICD-10-CM | POA: Diagnosis not present

## 2017-07-28 DIAGNOSIS — R278 Other lack of coordination: Secondary | ICD-10-CM | POA: Diagnosis not present

## 2017-07-30 DIAGNOSIS — Z9181 History of falling: Secondary | ICD-10-CM | POA: Diagnosis not present

## 2017-07-30 DIAGNOSIS — R278 Other lack of coordination: Secondary | ICD-10-CM | POA: Diagnosis not present

## 2017-07-31 ENCOUNTER — Emergency Department
Admission: EM | Admit: 2017-07-31 | Discharge: 2017-07-31 | Disposition: A | Discharge status: Home / Self Care | Payer: Medicare Other | Attending: Emergency Medicine | Admitting: Emergency Medicine

## 2017-07-31 ENCOUNTER — Encounter: Payer: Self-pay | Admitting: Emergency Medicine

## 2017-07-31 ENCOUNTER — Other Ambulatory Visit: Payer: Self-pay

## 2017-07-31 DIAGNOSIS — R293 Abnormal posture: Secondary | ICD-10-CM | POA: Insufficient documentation

## 2017-07-31 DIAGNOSIS — M79606 Pain in leg, unspecified: Secondary | ICD-10-CM | POA: Diagnosis not present

## 2017-07-31 DIAGNOSIS — M62838 Other muscle spasm: Secondary | ICD-10-CM | POA: Diagnosis not present

## 2017-07-31 DIAGNOSIS — I10 Essential (primary) hypertension: Secondary | ICD-10-CM | POA: Diagnosis not present

## 2017-07-31 DIAGNOSIS — G20 Parkinson's disease: Secondary | ICD-10-CM | POA: Insufficient documentation

## 2017-07-31 DIAGNOSIS — Z87891 Personal history of nicotine dependence: Secondary | ICD-10-CM | POA: Diagnosis not present

## 2017-07-31 DIAGNOSIS — Z79899 Other long term (current) drug therapy: Secondary | ICD-10-CM | POA: Insufficient documentation

## 2017-07-31 DIAGNOSIS — R464 Slowness and poor responsiveness: Secondary | ICD-10-CM | POA: Diagnosis not present

## 2017-07-31 DIAGNOSIS — R252 Cramp and spasm: Secondary | ICD-10-CM | POA: Diagnosis present

## 2017-07-31 LAB — COMPREHENSIVE METABOLIC PANEL
ALT: 23 U/L (ref 17–63)
AST: 27 U/L (ref 15–41)
Albumin: 4.8 g/dL (ref 3.5–5.0)
Alkaline Phosphatase: 70 U/L (ref 38–126)
Anion gap: 8 (ref 5–15)
BUN: 10 mg/dL (ref 6–20)
CALCIUM: 8.9 mg/dL (ref 8.9–10.3)
CO2: 29 mmol/L (ref 22–32)
CREATININE: 0.8 mg/dL (ref 0.61–1.24)
Chloride: 100 mmol/L — ABNORMAL LOW (ref 101–111)
Glucose, Bld: 105 mg/dL — ABNORMAL HIGH (ref 65–99)
Potassium: 3.5 mmol/L (ref 3.5–5.1)
Sodium: 137 mmol/L (ref 135–145)
TOTAL PROTEIN: 7.5 g/dL (ref 6.5–8.1)
Total Bilirubin: 1.2 mg/dL (ref 0.3–1.2)

## 2017-07-31 LAB — CBC WITH DIFFERENTIAL/PLATELET
Basophils Absolute: 0.1 10*3/uL (ref 0–0.1)
Basophils Relative: 1 %
EOS PCT: 1 %
Eosinophils Absolute: 0.1 10*3/uL (ref 0–0.7)
HCT: 47.7 % (ref 40.0–52.0)
HEMOGLOBIN: 16.1 g/dL (ref 13.0–18.0)
LYMPHS ABS: 1.2 10*3/uL (ref 1.0–3.6)
Lymphocytes Relative: 15 %
MCH: 30.7 pg (ref 26.0–34.0)
MCHC: 33.9 g/dL (ref 32.0–36.0)
MCV: 90.6 fL (ref 80.0–100.0)
Monocytes Absolute: 0.5 10*3/uL (ref 0.2–1.0)
Monocytes Relative: 6 %
Neutro Abs: 6.3 10*3/uL (ref 1.4–6.5)
Neutrophils Relative %: 77 %
Platelets: 193 10*3/uL (ref 150–440)
RBC: 5.26 MIL/uL (ref 4.40–5.90)
RDW: 13.9 % (ref 11.5–14.5)
WBC: 8.1 10*3/uL (ref 3.8–10.6)

## 2017-07-31 LAB — URINALYSIS, COMPLETE (UACMP) WITH MICROSCOPIC
BILIRUBIN URINE: NEGATIVE
Bacteria, UA: NONE SEEN
Glucose, UA: NEGATIVE mg/dL
HGB URINE DIPSTICK: NEGATIVE
KETONES UR: NEGATIVE mg/dL
Leukocytes, UA: NEGATIVE
NITRITE: NEGATIVE
PH: 7 (ref 5.0–8.0)
Protein, ur: NEGATIVE mg/dL
Specific Gravity, Urine: 1.003 — ABNORMAL LOW (ref 1.005–1.030)
Squamous Epithelial / LPF: NONE SEEN (ref 0–5)
WBC, UA: NONE SEEN WBC/hpf (ref 0–5)

## 2017-07-31 MED ORDER — SODIUM CHLORIDE 0.9 % IV BOLUS
500.0000 mL | Freq: Once | INTRAVENOUS | Status: AC
  Administered 2017-07-31: 500 mL via INTRAVENOUS

## 2017-07-31 MED ORDER — CARBIDOPA-LEVODOPA 25-100 MG PO TABS
1.0000 | ORAL_TABLET | Freq: Once | ORAL | Status: AC
  Administered 2017-07-31: 1 via ORAL
  Filled 2017-07-31: qty 1

## 2017-07-31 NOTE — ED Notes (Signed)
Pt ambulatory to toilet to urinate.

## 2017-07-31 NOTE — ED Provider Notes (Signed)
[facility] Emergency Department Provider Note   ____________________________________________    I have reviewed the triage vital signs and the nursing notes.   HISTORY  Chief Complaint Leg Pain     HPI Lucas Edwards is a 67 y.o. male with a history of Parkinson's disease who presents with complaints of toe and leg cramping.  Patient has a long history of this related to his Parkinson's disease and tremor, typically has this in the mornings.  He feels this is worse because over the last 24 hours he has had difficulty taking his pills because he has had a very dry mouth.  Denies difficulty swallowing solids but describes difficulty with individual pills.  Hence he has not had his Sinemet in 24 hours   Past Medical History:  Diagnosis Date  . Abdominal hernia   . Chest pain    a. 2001 Cath:  nl cors per pt.  Marland Kitchen GERD (gastroesophageal reflux disease)   . History of hypertension   . Hyperlipidemia   . Parkinson disease [facility])     Patient Active Problem List   Diagnosis Date Noted  . Unstable angina ([place]) 05/14/2017  . Paralysis agitans ([place]) 10/24/2013  . 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 INGUINAL HERNIA REPAIR Bilateral   . VASECTOMY      Prior to Admission medications   Medication Sig Start Date End Date Taking? Authorizing Provider  carbidopa-levodopa (SINEMET IR) 25-100 MG tablet Take 2 tablets by mouth. TAKE 2 TABLETS BY MOUTH SIX TIMES DAILY AND TAKE 1 ADDITIONAL TABLET BY MOUTH AT BEDTIME   Yes [provider]  docusate sodium (COLACE) 100 MG capsule Take 100 mg by mouth 4 (four) times daily.    Yes [provider]  donepezil (ARICEPT) 10 MG tablet Take 10 mg by mouth every evening.    Yes [provider]  entacapone (COMTAN) 200 MG tablet Take 200 mg by mouth 4 (four) times daily.   Yes [provider]  MYRBETRIQ 25 MG TB24 tablet Take 25  mg by mouth daily. 04/29/17  Yes [provider]  pantoprazole (PROTONIX) 40 MG tablet Take 40 mg by mouth daily.  10/21/13  Yes [provider]  clonazePAM (KLONOPIN) 0.5 MG tablet Take 1 tablet (0.5 mg total) by mouth at bedtime. Patient not taking: Reported on 07/31/2017 05/15/17   Epifanio Lesches, MD  cyclobenzaprine (FLEXERIL) 5 MG tablet TAKE 1 TABLET BY MOUTH EVERY NIGHT AS NEEDED FOR MUSCLE SPASM Patient not taking: Reported on 05/14/2017 12/18/13   Tat, Eustace Quail, DO     Allergies Sulfa antibiotics and Tylenol [acetaminophen]  Family History  Problem Relation Age of Onset  . Multiple sclerosis Mother        died @ 95  . Stroke Father        died @ 59  . Heart attack Father   . Lung cancer Sister        died in her 51's    Social History Social History   Tobacco Use  . Smoking status: Former Research scientist (life sciences)  . Smokeless tobacco: Never Used  . Tobacco comment: during high school/college  Substance Use Topics  . Alcohol use: Yes    Comment: rare - binges about once/yr or less.  . Drug use: No    Comment: marijuana occ    Review of Systems  Constitutional: No fever/chills Eyes: No visual changes.  ENT: No neck pain  Cardiovascular: Denies chest pain. Respiratory: Denies shortness of breath. Gastrointestinal: No nausea, no vomiting.   Genitourinary: Negative for dysuria. Musculoskeletal: Toe spasms bilaterally as above Skin: Negative for rash. Neurological: Negative for headaches   ____________________________________________   PHYSICAL EXAM:  VITAL SIGNS: ED Triage Vitals  Enc Vitals Group     BP 07/31/17 0810 (!) 157/86     Pulse Rate 07/31/17 0810 92     Resp 07/31/17 0810 16     Temp 07/31/17 0810 98.2 F (36.8 C)     Temp Source 07/31/17 0810 Oral     SpO2 07/31/17 0810 100 %     Weight 07/31/17 0804 74.8 kg (165 lb)     Height 07/31/17 0804 1.753 m (5\' 9" )     Head Circumference --      Peak Flow --      Pain Score 07/31/17 0803 8       Pain Loc --      Pain Edu? --      Excl. in [place]? --     Constitutional: Alert and oriented.. Pleasant and interactive Eyes: Conjunctivae are normal.   Nose: No congestion/rhinnorhea. Mouth/Throat: Mucous membranes are moist.    Cardiovascular: Normal rate, regular rhythm. Grossly normal heart sounds.  Good peripheral circulation. Respiratory: Normal respiratory effort.  No retractions. Lungs CTAB. Gastrointestinal: Soft and nontender. No distention.  No CVA tenderness. Genitourinary: deferred Musculoskeletal: Feet are warm and well-perfused, no erythema or abnormalities noted.  Tremor noted, greater on the left than right.  Normal pulses bilaterally, no evidence of infection Neurologic:  Normal speech and language. No gross focal neurologic deficits are appreciated.  Skin:  Skin is warm, dry and intact. No rash noted. Psychiatric: Mood and affect are normal. Speech and behavior are normal.  ____________________________________________   LABS (all labs ordered are listed, but only abnormal results are displayed)  Labs Reviewed  COMPREHENSIVE METABOLIC PANEL - Abnormal; Notable for the following components:      Result Value   Chloride 100 (*)    Glucose, Bld 105 (*)    All other components within normal limits  URINALYSIS, COMPLETE (UACMP) WITH MICROSCOPIC - Abnormal; Notable for the following components:   Color, Urine STRAW (*)    APPearance CLEAR (*)    Specific Gravity, Urine 1.003 (*)    All other components within normal limits  CBC WITH DIFFERENTIAL/PLATELET   ____________________________________________  EKG  None ____________________________________________  RADIOLOGY  None ____________________________________________   PROCEDURES  Procedure(s) performed: No  Procedures   Critical Care performed: No ____________________________________________   INITIAL IMPRESSION / ASSESSMENT AND PLAN / ED COURSE  Pertinent labs & imaging results that were  available during my care of the patient were reviewed by me and considered in my medical decision making (see chart for details).  Suspect cramping is related to Parkinson's disease/tremor.  We will give the patient's Sinemet and reevaluate  Patient's cramping has improved after Sinemet.  Discussed with him the need to continue medication every 3 hours as prescribed.  He was able to take the medication easily with some applesauce    ____________________________________________   FINAL CLINICAL IMPRESSION(S) / ED DIAGNOSES  Final diagnoses:  Parkinson's disease (tremor, stiffness, slow motion, unstable posture) ([place])        Note:  This document was prepared using Dragon voice recognition software and may include unintentional dictation errors.    Lavonia Drafts, MD 07/31/17 [phone]

## 2017-07-31 NOTE — ED Triage Notes (Signed)
Arrives from [facility] via EMS.  States awoke this morning at around 0200 with bilateral leg and toe cramping.  Patient has history of Parkinson's and has had history of same complaint.  CBG:  113.  BP:  164/90

## 2017-07-31 NOTE — ED Notes (Signed)
Pt ambulatory to toilet

## 2017-07-31 NOTE — ED Notes (Signed)
Pt took medication with applesauce.

## 2017-07-31 NOTE — ED Notes (Signed)
Pt ambulatory to toilet to urinate. States he can't use a urinal. This RN remains at bedside while pt is up.

## 2017-07-31 NOTE — ED Notes (Addendum)
Pt states that about 2am this morning he woke up with leg cramps and toe cramps. Thought he was dehydrated so he drank fluids. Didn't help. Pt is standing in room upon assessment, states lying down makes cramping worse. States walking around decreases cramping. Has a hx of toe cramping. Pt has parkinson's and states that he can't swallow his parkinson's pills recently. Last dose was 24 hours ago. States he takes his meds every 3 hours. Pt is stuttering over words. States his feet tremor and then pain goes up legs. Alert and oriented.

## 2017-07-31 NOTE — ED Notes (Signed)
Pt states that his cramping is "easing off" and that his left leg is still "shuddering."  He reports that this is new as of last night. Pt alert & oriented with NAD noted.

## 2017-07-31 NOTE — ED Notes (Signed)
Pt discharged home after verbalizing understanding of discharge instructions; nad noted. 

## 2017-08-02 DIAGNOSIS — R278 Other lack of coordination: Secondary | ICD-10-CM | POA: Diagnosis not present

## 2017-08-02 DIAGNOSIS — Z9181 History of falling: Secondary | ICD-10-CM | POA: Diagnosis not present

## 2017-08-04 DIAGNOSIS — Z9181 History of falling: Secondary | ICD-10-CM | POA: Diagnosis not present

## 2017-08-04 DIAGNOSIS — R278 Other lack of coordination: Secondary | ICD-10-CM | POA: Diagnosis not present

## 2017-08-05 ENCOUNTER — Emergency Department: Payer: Medicare Other

## 2017-08-05 ENCOUNTER — Inpatient Hospital Stay
Admission: EM | Admit: 2017-08-05 | Discharge: 2017-08-10 | DRG: 470 | Disposition: A | Discharge status: Skilled Nursing Facility | Payer: Medicare Other | Attending: Internal Medicine | Admitting: Internal Medicine

## 2017-08-05 ENCOUNTER — Other Ambulatory Visit: Payer: Self-pay

## 2017-08-05 DIAGNOSIS — I959 Hypotension, unspecified: Secondary | ICD-10-CM

## 2017-08-05 DIAGNOSIS — M25552 Pain in left hip: Secondary | ICD-10-CM | POA: Diagnosis not present

## 2017-08-05 DIAGNOSIS — Z66 Do not resuscitate: Secondary | ICD-10-CM | POA: Diagnosis present

## 2017-08-05 DIAGNOSIS — G20 Parkinson's disease: Secondary | ICD-10-CM | POA: Diagnosis present

## 2017-08-05 DIAGNOSIS — Z87891 Personal history of nicotine dependence: Secondary | ICD-10-CM

## 2017-08-05 DIAGNOSIS — Z8249 Family history of ischemic heart disease and other diseases of the circulatory system: Secondary | ICD-10-CM | POA: Diagnosis not present

## 2017-08-05 DIAGNOSIS — S72009A Fracture of unspecified part of neck of unspecified femur, initial encounter for closed fracture: Secondary | ICD-10-CM | POA: Diagnosis present

## 2017-08-05 DIAGNOSIS — S72002A Fracture of unspecified part of neck of left femur, initial encounter for closed fracture: Secondary | ICD-10-CM | POA: Diagnosis not present

## 2017-08-05 DIAGNOSIS — Z820 Family history of epilepsy and other diseases of the nervous system: Secondary | ICD-10-CM | POA: Diagnosis not present

## 2017-08-05 DIAGNOSIS — S72042A Displaced fracture of base of neck of left femur, initial encounter for closed fracture: Secondary | ICD-10-CM | POA: Diagnosis not present

## 2017-08-05 DIAGNOSIS — Z79899 Other long term (current) drug therapy: Secondary | ICD-10-CM

## 2017-08-05 DIAGNOSIS — E785 Hyperlipidemia, unspecified: Secondary | ICD-10-CM | POA: Diagnosis present

## 2017-08-05 DIAGNOSIS — R278 Other lack of coordination: Secondary | ICD-10-CM | POA: Diagnosis not present

## 2017-08-05 DIAGNOSIS — Z888 Allergy status to other drugs, medicaments and biological substances status: Secondary | ICD-10-CM

## 2017-08-05 DIAGNOSIS — G3183 Dementia with Lewy bodies: Secondary | ICD-10-CM | POA: Diagnosis not present

## 2017-08-05 DIAGNOSIS — K219 Gastro-esophageal reflux disease without esophagitis: Secondary | ICD-10-CM | POA: Diagnosis present

## 2017-08-05 DIAGNOSIS — S72143A Displaced intertrochanteric fracture of unspecified femur, initial encounter for closed fracture: Secondary | ICD-10-CM | POA: Diagnosis not present

## 2017-08-05 DIAGNOSIS — Z01810 Encounter for preprocedural cardiovascular examination: Secondary | ICD-10-CM | POA: Diagnosis not present

## 2017-08-05 DIAGNOSIS — Z9181 History of falling: Secondary | ICD-10-CM | POA: Diagnosis not present

## 2017-08-05 DIAGNOSIS — R011 Cardiac murmur, unspecified: Secondary | ICD-10-CM | POA: Diagnosis present

## 2017-08-05 DIAGNOSIS — Z801 Family history of malignant neoplasm of trachea, bronchus and lung: Secondary | ICD-10-CM

## 2017-08-05 DIAGNOSIS — Z471 Aftercare following joint replacement surgery: Secondary | ICD-10-CM | POA: Diagnosis not present

## 2017-08-05 DIAGNOSIS — S72012A Unspecified intracapsular fracture of left femur, initial encounter for closed fracture: Secondary | ICD-10-CM | POA: Diagnosis not present

## 2017-08-05 DIAGNOSIS — M25532 Pain in left wrist: Secondary | ICD-10-CM | POA: Diagnosis not present

## 2017-08-05 DIAGNOSIS — I10 Essential (primary) hypertension: Secondary | ICD-10-CM | POA: Diagnosis present

## 2017-08-05 DIAGNOSIS — Z882 Allergy status to sulfonamides status: Secondary | ICD-10-CM

## 2017-08-05 DIAGNOSIS — W109XXA Fall (on) (from) unspecified stairs and steps, initial encounter: Secondary | ICD-10-CM | POA: Diagnosis present

## 2017-08-05 DIAGNOSIS — Z823 Family history of stroke: Secondary | ICD-10-CM | POA: Diagnosis not present

## 2017-08-05 DIAGNOSIS — S6992XA Unspecified injury of left wrist, hand and finger(s), initial encounter: Secondary | ICD-10-CM | POA: Diagnosis not present

## 2017-08-05 DIAGNOSIS — S7292XD Unspecified fracture of left femur, subsequent encounter for closed fracture with routine healing: Secondary | ICD-10-CM | POA: Diagnosis not present

## 2017-08-05 DIAGNOSIS — S72032A Displaced midcervical fracture of left femur, initial encounter for closed fracture: Secondary | ICD-10-CM | POA: Diagnosis present

## 2017-08-05 DIAGNOSIS — S7292XA Unspecified fracture of left femur, initial encounter for closed fracture: Secondary | ICD-10-CM | POA: Diagnosis not present

## 2017-08-05 DIAGNOSIS — M6281 Muscle weakness (generalized): Secondary | ICD-10-CM | POA: Diagnosis not present

## 2017-08-05 DIAGNOSIS — Z96649 Presence of unspecified artificial hip joint: Secondary | ICD-10-CM

## 2017-08-05 DIAGNOSIS — Z7401 Bed confinement status: Secondary | ICD-10-CM | POA: Diagnosis not present

## 2017-08-05 DIAGNOSIS — Z96642 Presence of left artificial hip joint: Secondary | ICD-10-CM | POA: Diagnosis not present

## 2017-08-05 DIAGNOSIS — F0390 Unspecified dementia without behavioral disturbance: Secondary | ICD-10-CM | POA: Diagnosis not present

## 2017-08-05 DIAGNOSIS — R2681 Unsteadiness on feet: Secondary | ICD-10-CM | POA: Diagnosis not present

## 2017-08-05 DIAGNOSIS — W19XXXA Unspecified fall, initial encounter: Secondary | ICD-10-CM | POA: Diagnosis not present

## 2017-08-05 LAB — CBC WITH DIFFERENTIAL/PLATELET
BASOS ABS: 0.1 10*3/uL (ref 0–0.1)
BASOS PCT: 1 %
EOS PCT: 2 %
Eosinophils Absolute: 0.2 10*3/uL (ref 0–0.7)
HEMATOCRIT: 45.3 % (ref 40.0–52.0)
Hemoglobin: 15.6 g/dL (ref 13.0–18.0)
Lymphocytes Relative: 18 %
Lymphs Abs: 1.4 10*3/uL (ref 1.0–3.6)
MCH: 31 pg (ref 26.0–34.0)
MCHC: 34.3 g/dL (ref 32.0–36.0)
MCV: 90.4 fL (ref 80.0–100.0)
Monocytes Absolute: 0.5 10*3/uL (ref 0.2–1.0)
Monocytes Relative: 7 %
NEUTROS ABS: 5.8 10*3/uL (ref 1.4–6.5)
Neutrophils Relative %: 72 %
PLATELETS: 199 10*3/uL (ref 150–440)
RBC: 5.01 MIL/uL (ref 4.40–5.90)
RDW: 13.7 % (ref 11.5–14.5)
WBC: 8 10*3/uL (ref 3.8–10.6)

## 2017-08-05 LAB — BASIC METABOLIC PANEL
ANION GAP: 7 (ref 5–15)
BUN: 17 mg/dL (ref 6–20)
CALCIUM: 8.8 mg/dL — AB (ref 8.9–10.3)
CO2: 29 mmol/L (ref 22–32)
Chloride: 101 mmol/L (ref 101–111)
Creatinine, Ser: 0.96 mg/dL (ref 0.61–1.24)
GFR calc Af Amer: >60 mL/min (ref >60)
Glucose, Bld: 104 mg/dL — ABNORMAL HIGH (ref 65–99)
POTASSIUM: 4 mmol/L (ref 3.5–5.1)
SODIUM: 137 mmol/L (ref 135–145)

## 2017-08-05 LAB — PROTIME-INR
INR: 0.91
Prothrombin Time: 12.2 seconds (ref 11.4–15.2)

## 2017-08-05 MED ORDER — HYDROMORPHONE HCL 1 MG/ML IJ SOLN
1.0000 mg | Freq: Once | INTRAMUSCULAR | Status: AC
  Administered 2017-08-05: 1 mg via INTRAVENOUS
  Filled 2017-08-05: qty 1

## 2017-08-05 MED ORDER — CEFAZOLIN SODIUM-DEXTROSE 2-4 GM/100ML-% IV SOLN
2.0000 g | INTRAVENOUS | Status: AC
  Administered 2017-08-06: 2 g via INTRAVENOUS
  Filled 2017-08-05: qty 100

## 2017-08-05 MED ORDER — MORPHINE SULFATE (PF) 4 MG/ML IV SOLN
4.0000 mg | Freq: Once | INTRAVENOUS | Status: AC
  Administered 2017-08-05: 4 mg via INTRAVENOUS
  Filled 2017-08-05: qty 1

## 2017-08-05 MED ORDER — SODIUM CHLORIDE 0.9 % IV SOLN
Freq: Once | INTRAVENOUS | Status: AC
  Administered 2017-08-05: 22:00:00 via INTRAVENOUS

## 2017-08-05 MED ORDER — SODIUM CHLORIDE 0.9 % IV BOLUS
500.0000 mL | Freq: Once | INTRAVENOUS | Status: AC
  Administered 2017-08-05: 500 mL via INTRAVENOUS

## 2017-08-05 NOTE — ED Triage Notes (Signed)
Pt arrived via EMS from [facility] independent living apartments with complaint of an unwitnessed fall and left hip pain. Pt is alert and oriented x 4. Pt fell down 3-4 stairs and injured his left hip and wrist. Pt stated that he hit his head but has no HA and denies LOC. Pt states he cannot straighten his legs very well because of his Hx of Parkinsons. Pt has no obvious deformity in in legs. VS per EMS BP-174/103 HR-88 O2sat-96%RA.

## 2017-08-05 NOTE — ED Provider Notes (Signed)
[facility] Emergency Department Provider Note ____________________________________________   First MD Initiated Contact with Patient 08/05/17 2014     (approximate)  I have reviewed the triage vital signs and the nursing notes.   HISTORY  Chief Complaint Fall    HPI Lucas Edwards is a 67 y.o. male with history of Parkinson's and other PMH as noted below who presents with left hip and hand pain, acute onset approximately 1 hour ago after a fall down 3-4 stairs, and worse with any movement.  Patient states that he was unable to get up due to the pain.  He denies head injury or LOC.  He denies neck or back pain.   Past Medical History:  Diagnosis Date  . Abdominal hernia   . Chest pain    a. 2001 Cath:  nl cors per pt.  Marland Kitchen GERD (gastroesophageal reflux disease)   . History of hypertension   . Hyperlipidemia   . Parkinson disease [facility])     Patient Active Problem List   Diagnosis Date Noted  . Unstable angina ([place]) 05/14/2017  . Paralysis agitans ([place]) 10/24/2013  . 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 INGUINAL HERNIA REPAIR Bilateral   . VASECTOMY      Prior to Admission medications   Medication Sig Start Date End Date Taking? Authorizing Provider  carbidopa-levodopa (SINEMET IR) 25-100 MG tablet Take 2 tablets by mouth. TAKE 2 TABLETS BY MOUTH SIX TIMES DAILY AND TAKE 1 ADDITIONAL TABLET BY MOUTH AT BEDTIME   Yes [provider]  docusate sodium (COLACE) 100 MG capsule Take 100 mg by mouth 4 (four) times daily.    Yes [provider]  donepezil (ARICEPT) 10 MG tablet Take 10 mg by mouth every evening.    Yes [provider]  entacapone (COMTAN) 200 MG tablet Take 200 mg by mouth 4 (four) times daily.   Yes [provider]  MYRBETRIQ 25 MG TB24 tablet Take 25 mg by mouth daily. 04/29/17  Yes [provider]  pantoprazole (PROTONIX) 40 MG tablet  Take 40 mg by mouth daily.  10/21/13  Yes [provider]    Allergies Sulfa antibiotics and Tylenol [acetaminophen]  Family History  Problem Relation Age of Onset  . Multiple sclerosis Mother        died @ 88  . Stroke Father        died @ 55  . Heart attack Father   . Lung cancer Sister        died in her 62's    Social History Social History   Tobacco Use  . Smoking status: Former Research scientist (life sciences)  . Smokeless tobacco: Never Used  . Tobacco comment: during high school/college  Substance Use Topics  . Alcohol use: Yes    Comment: rare - binges about once/yr or less.  . Drug use: No    Comment: marijuana occ    Review of Systems  Constitutional: No fever. Eyes: No redness. ENT: No sore neck pain. Cardiovascular: Denies chest pain. Respiratory: Denies shortness of breath. Gastrointestinal: No abdominal pain.  Genitourinary: Negative for flank pain.  Musculoskeletal: Negative for back pain.  Positive for left hip pain. Skin: Negative for abrasions or lacerations. Neurological: Negative for headache.   ____________________________________________   PHYSICAL EXAM:  VITAL SIGNS: ED Triage Vitals  Enc Vitals Group     BP 08/05/17 2015 (!) 189/111     Pulse Rate 08/05/17 2008  87     Resp 08/05/17 2008 18     Temp 08/05/17 2008 98.2 F (36.8 C)     Temp Source 08/05/17 2008 Oral     SpO2 08/05/17 2008 96 %     Weight 08/05/17 2010 165 lb (74.8 kg)     Height 08/05/17 2010 5\' 9"  (1.753 m)     Head Circumference --      Peak Flow --      Pain Score 08/05/17 2009 8     Pain Loc --      Pain Edu? --      Excl. in [place]? --     Constitutional: Alert and oriented.  Slightly uncomfortable appearing but in no acute distress. Eyes: Conjunctivae are normal.  EOMI. Head: Atraumatic. Nose: No congestion/rhinnorhea. Mouth/Throat: Mucous membranes are slightly dry.   Neck: Normal range of motion.  No midline C-spine tenderness. Cardiovascular:  Good peripheral  circulation. Respiratory: Normal respiratory effort. Gastrointestinal:  No distention.  Genitourinary: No flank tenderness. Musculoskeletal: Pain on any ROM of left hip.  No deformity or shortening.  2+ DP pulses bilaterally.  Mild tenderness to left hand along the radial aspect with no deformity.  Minimal wrist tenderness with no deformity.  2+ radial pulses bilaterally.  No midline spinal tenderness. Neurologic:  Normal speech and language. No gross focal neurologic deficits are appreciated.  Skin:  Skin is warm and dry. No rash noted. Psychiatric: Mood and affect are normal. Speech and behavior are normal.  ____________________________________________   LABS (all labs ordered are listed, but only abnormal results are displayed)  Labs Reviewed  BASIC METABOLIC PANEL - Abnormal; Notable for the following components:      Result Value   Glucose, Bld 104 (*)    Calcium 8.8 (*)    All other components within normal limits  CBC WITH DIFFERENTIAL/PLATELET  PROTIME-INR   ____________________________________________  EKG   ____________________________________________  RADIOLOGY  XR L hip: Transcervical femur fracture XR L hand: No acute fracture XR L wrist: No acute fracture CXR: No focal infiltrate or other acute findings  ____________________________________________   PROCEDURES  Procedure(s) performed: No  Procedures  Critical Care performed: No ____________________________________________   INITIAL IMPRESSION / ASSESSMENT AND PLAN / ED COURSE  Pertinent labs & imaging results that were available during my care of the patient were reviewed by me and considered in my medical decision making (see chart for details).  67 year old male with history of Parkinson's disease and other PMH as noted below presents with left hip, wrist, and hand pain after a fall down several steps.  The fall was unwitnessed, however the patient states he remembers a clearly and was able to  describe it to me.  It appears mechanical in nature.  He denies head injury and had no LOC.  Exam is as described above.  Neuro is nonfocal.  There is pain to the left hip with no obvious deformity, and pain to the left hand and wrist.  All extremities are neuro/vascular intact.  Plan: Analgesia, x-rays, and reassess.    ----------------------------------------- 11:22 PM on 08/05/2017 -----------------------------------------  I consulted Dr. Roland Rack from orthopedics who advised that the patient will require surgery.  I admitted the patient to the hospitalist Dr. Duane Boston.  I spoke to the patient's niece who is his power of attorney and informed her of the plan.  She asked to be called about any further updates.  Her number is [phone].  ____________________________________________   FINAL CLINICAL IMPRESSION(S) /  ED DIAGNOSES  Final diagnoses:  Closed fracture of left femur, unspecified fracture morphology, unspecified portion of femur, initial encounter ([place])      NEW MEDICATIONS STARTED DURING THIS VISIT:  New Prescriptions   No medications on file     Note:  This document was prepared using Dragon voice recognition software and may include unintentional dictation errors.    Arta Silence, MD 08/05/17 [phone]

## 2017-08-05 NOTE — H&P (Signed)
[facility] NAME: Lucas Edwards    MR#:  [id number]  DATE OF BIRTH:  02/19/1951  DATE OF ADMISSION:  08/05/2017  PRIMARY CARE PHYSICIAN: Dion Body, MD   REQUESTING/REFERRING PHYSICIAN:   CHIEF COMPLAINT:   Chief Complaint  Patient presents with  . Fall    HISTORY OF PRESENT ILLNESS: Lucas Edwards  is a 67 y.o. male with a known history of Parkinson's disease, hypertension, hyperlipidemia. Patient was brought to emergency room status post mechanical fall, at home, while going down the stairs.  Patient missed one step and fell down 3-4 stairs.  He immediately felt pain at his left hip and left hand.  He was not able to get up from the floor, due to pain.  He denies any head injury or loss of consciousness.  He denies any neck or back pain. Patient is usually physically active, walking at least 1 mile 3 times a day, every day.  There is no chest pain or shortness of breath with exertion. Upon evaluation in the emergency room, per left hip x-ray, he is noted with acute left transcervical fracture of the femur with varus angulation. No hip joint dislocation.  Blood test done emergency room, including CBC and BMP are essentially unremarkable.  Telemetry strip, reviewed by myself, shows normal sinus rhythm, no acute ischemic changes. Patient is admitted for possible surgical repair of his left femur fracture.   PAST MEDICAL HISTORY:   Past Medical History:  Diagnosis Date  . Abdominal hernia   . Chest pain    a. 2001 Cath:  nl cors per pt.  Marland Kitchen GERD (gastroesophageal reflux disease)   . History of hypertension   . Hyperlipidemia   . Parkinson disease ([place])     PAST SURGICAL HISTORY:  Past Surgical History:  Procedure Laterality Date  . INGUINAL HERNIA REPAIR Bilateral   . VASECTOMY      SOCIAL HISTORY:  Social History   Tobacco Use  . Smoking status: Former Research scientist (life sciences)  . Smokeless tobacco: Never Used  .  Tobacco comment: during high school/college  Substance Use Topics  . Alcohol use: Yes    Comment: rare - binges about once/yr or less.    FAMILY HISTORY:  Family History  Problem Relation Age of Onset  . Multiple sclerosis Mother        died @ 32  . Stroke Father        died @ 9  . Heart attack Father   . Lung cancer Sister        died in her 85's    DRUG ALLERGIES:  Allergies  Allergen Reactions  . Sulfa Antibiotics Hives  . Tylenol [Acetaminophen] Itching    REVIEW OF SYSTEMS:   CONSTITUTIONAL: No fever, fatigue or weakness.  EYES: No blurred or double vision.  EARS, NOSE, AND THROAT: No tinnitus or ear pain.  RESPIRATORY: No cough, shortness of breath, wheezing or hemoptysis.  CARDIOVASCULAR: No chest pain, orthopnea, edema.  GASTROINTESTINAL: No nausea, vomiting, diarrhea or abdominal pain.  GENITOURINARY: No dysuria, hematuria.  ENDOCRINE: No polyuria, nocturia,  HEMATOLOGY: No anemia, easy bruising or bleeding SKIN: No rash or lesion. MUSCULOSKELETAL: No joint pain or arthritis.   NEUROLOGIC: No tingling, numbness, weakness.  PSYCHIATRY: No anxiety or depression.   MEDICATIONS AT HOME:  Prior to Admission medications   Medication Sig Start Date End Date Taking? Authorizing Provider  carbidopa-levodopa (SINEMET IR) 25-100 MG tablet Take 2 tablets by mouth.  TAKE 2 TABLETS BY MOUTH SIX TIMES DAILY AND TAKE 1 ADDITIONAL TABLET BY MOUTH AT BEDTIME   Yes [provider]  docusate sodium (COLACE) 100 MG capsule Take 100 mg by mouth 4 (four) times daily.    Yes [provider]  donepezil (ARICEPT) 10 MG tablet Take 10 mg by mouth every evening.    Yes [provider]  entacapone (COMTAN) 200 MG tablet Take 200 mg by mouth 4 (four) times daily.   Yes [provider]  MYRBETRIQ 25 MG TB24 tablet Take 25 mg by mouth daily. 04/29/17  Yes [provider]  pantoprazole (PROTONIX) 40 MG tablet Take 40 mg by mouth daily.  10/21/13   Yes [provider]      PHYSICAL EXAMINATION:   VITAL SIGNS: Blood pressure (!) 185/104, pulse 100, temperature 98.2 F (36.8 C), temperature source Oral, resp. rate 18, height 5\' 9"  (1.753 m), weight 74.8 kg (165 lb), SpO2 96 %.  GENERAL:  67 y.o.-year-old patient lying in the bed with moderate distress, secondary to left hip pain.  EYES: Pupils equal, round, reactive to light and accommodation. No scleral icterus. Extraocular muscles intact.  HEENT: Head atraumatic, normocephalic. Oropharynx and nasopharynx clear.  NECK:  Supple, no jugular venous distention. No thyroid enlargement, no tenderness.  LUNGS: Normal breath sounds bilaterally, no wheezing, rales,rhonchi or crepitation. No use of accessory muscles of respiration.  CARDIOVASCULAR: S1, S2 normal. No S3/S4.  ABDOMEN: Soft, nontender, nondistended. Bowel sounds present. No organomegaly or mass.  EXTREMITIES: Left lower extremities noted shortened and externally rotated.  NEUROLOGIC: No focal weakness.  Left hand tremor at rest secondary to Parkinson's is noted. PSYCHIATRIC: The patient is alert and oriented x 3.  SKIN: No obvious rash, lesion, or ulcer.   LABORATORY PANEL:   CBC Recent Labs  Lab 07/31/17 0813 08/05/17 2016  WBC 8.1 8.0  HGB 16.1 15.6  HCT 47.7 45.3  PLT 193 199  MCV 90.6 90.4  MCH 30.7 31.0  MCHC 33.9 34.3  RDW 13.9 13.7  LYMPHSABS 1.2 1.4  MONOABS 0.5 0.5  EOSABS 0.1 0.2  BASOSABS 0.1 0.1   ------------------------------------------------------------------------------------------------------------------  Chemistries  Recent Labs  Lab 07/31/17 0813 08/05/17 2016  NA 137 137  K 3.5 4.0  CL 100* 101  CO2 29 29  GLUCOSE 105* 104*  BUN 10 17  CREATININE 0.80 0.96  CALCIUM 8.9 8.8*  AST 27  --   ALT 23  --   ALKPHOS 70  --   BILITOT 1.2  --    ------------------------------------------------------------------------------------------------------------------ estimated  creatinine clearance is 76.7 mL/min (by C-G formula based on SCr of 0.96 mg/dL). ------------------------------------------------------------------------------------------------------------------ No results for input(s): TSH, T4TOTAL, T3FREE, THYROIDAB in the last 72 hours.  Invalid input(s): FREET3   Coagulation profile Recent Labs  Lab 08/05/17 2016  INR 0.91   ------------------------------------------------------------------------------------------------------------------- No results for input(s): DDIMER in the last 72 hours. -------------------------------------------------------------------------------------------------------------------  Cardiac Enzymes No results for input(s): CKMB, TROPONINI, MYOGLOBIN in the last 168 hours.  Invalid input(s): CK ------------------------------------------------------------------------------------------------------------------ Invalid input(s): POCBNP  ---------------------------------------------------------------------------------------------------------------  Urinalysis    Component Value Date/Time   COLORURINE STRAW (A) 07/31/2017 0813   APPEARANCEUR CLEAR (A) 07/31/2017 0813   APPEARANCEUR Clear 06/24/2013 1454   LABSPEC 1.003 (L) 07/31/2017 0813   LABSPEC 1.023 06/24/2013 1454   PHURINE 7.0 07/31/2017 0813   GLUCOSEU NEGATIVE 07/31/2017 0813   GLUCOSEU Negative 06/24/2013 1454   HGBUR NEGATIVE 07/31/2017 0813   BILIRUBINUR NEGATIVE 07/31/2017 0813   BILIRUBINUR Negative 06/24/2013  Ages 07/31/2017 0813   PROTEINUR NEGATIVE 07/31/2017 0813   NITRITE NEGATIVE 07/31/2017 0813   LEUKOCYTESUR NEGATIVE 07/31/2017 0813   LEUKOCYTESUR Negative 06/24/2013 1454     RADIOLOGY: Dg Wrist Complete Left  Result Date: 08/05/2017 CLINICAL DATA:  Pain after unwitnessed fall. EXAM: LEFT WRIST - COMPLETE 3+ VIEW COMPARISON:  Report from left hand radiograph from 04/05/1999. FINDINGS: There is no evidence of fracture or  dislocation. There is no evidence of arthropathy. Old posttraumatic deformity of the third distal phalanx. Soft tissues are unremarkable. IMPRESSION: No acute fracture identified. No malalignment is noted. Remote posttraumatic deformity of the third distal phalanx. Electronically Signed   By: Ashley Royalty M.D.   On: 08/05/2017 22:02   Dg Hand 2 View Left  Result Date: 08/05/2017 CLINICAL DATA:  Unwitnessed fall.  Left wrist pain. EXAM: LEFT HAND - 2 VIEW COMPARISON:  Left hand report from 04/05/1999 FINDINGS: Tapered appearance of the third distal phalanx with thin overlying soft tissues. 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 Findings are therefore consistent with remote posttraumatic change and less likely due to an inflammatory arthropathy, in particular psoriasis. No acute fracture or bone destruction. No joint dislocation. IMPRESSION: 1. No acute fracture of the left hand and wrist. 2. Remote posttraumatic deformity of the distal phalanx of the left middle finger. Electronically Signed   By: Ashley Royalty M.D.   On: 08/05/2017 21:51   Dg Chest Portable 1 View  Result Date: 08/05/2017 CLINICAL DATA:  Preop hip fracture. EXAM: PORTABLE CHEST 1 VIEW COMPARISON:  10/19/2015 FINDINGS: The heart size and mediastinal contours are within normal limits. No pulmonary consolidation, effusion or pneumothorax. The visualized skeletal structures are unremarkable. IMPRESSION: No active disease. Electronically Signed   By: Ashley Royalty M.D.   On: 08/05/2017 22:00   Dg Hip Unilat W Or Wo Pelvis 2-3 Views Left  Result Date: 08/05/2017 CLINICAL DATA:  Left hip pain after unwitnessed fall EXAM: DG HIP (WITH OR WITHOUT PELVIS) 2-3V LEFT COMPARISON:  None. FINDINGS: Acute, closed, varus angulated transcervical fracture of the left femur. No hip joint dislocation. 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 Bony pelvis appears intact. No  pelvic diastasis. Left inguinal hernia repair with tacks in place. IMPRESSION: 1. Acute left transcervical fracture of the femur with varus angulation. No hip joint dislocation. 2. The bony pelvis appears intact. 3. 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 Electronically Signed   By: Ashley Royalty M.D.   On: 08/05/2017 21:48    EKG: Orders placed or performed during the hospital encounter of 05/14/17  . EKG 12-Lead  . EKG 12-Lead  . EKG 12-Lead (at 6am)  . EKG 12-Lead (at 6am)  . EKG    IMPRESSION AND PLAN:  1.  Acute left femur transcervical fracture, with varus angulation.  He is scheduled for surgical repair in the morning.  We will keep patient n.p.o. We will avoid any anticoagulants for now.  Continue pain control.  Based on patient's medical history, physical capacity and EKG review, he is medically clear for the above-mentioned orthopedic procedure, with minimal risk for cardiopulmonary complications. 2.  Parkinson's disease, stable, will continue home medications. 3.  Hypertension, stable, will restart home medications.   All the records are reviewed and case discussed with ED provider. Management plans discussed with the patient and he is in agreement.  CODE STATUS: FULL  CODE DURING SURGERY Code Status History    Date Active Date Inactive Code Status Order ID Comments User Context   05/14/2017 1656 05/15/2017 1941 DNR [id number]  Nicholes Mango, MD ED    Questions for Most Recent Historical Code Status (Order [id number])    Question Answer Comment   In the event of cardiac or respiratory ARREST Do not call a "code blue"    In the event of cardiac or respiratory ARREST Do not perform Intubation, CPR, defibrillation or ACLS    In the event of cardiac or respiratory ARREST Use medication by any route, position, wound care, and other measures to relive pain and suffering. May use oxygen, suction and manual treatment of airway obstruction as needed  for comfort.    Comments RN may pronounce         Advance Directive Documentation     Most Recent Value  Type of Advance Directive  Living will, Healthcare Power of Attorney  Pre-existing out of facility DNR order (yellow form or pink MOST form)  -  "MOST" Form in Place?  -       TOTAL TIME TAKING CARE OF THIS PATIENT: 40 minutes.    Amelia Jo M.D on 08/05/2017 at 11:53 PM  Between 7am to 6pm - Pager - [phone]  After 6pm go to [email or web address] - password [email or web address] [facility]  Office  [phone]  CC: Primary care physician; Dion Body, MD

## 2017-08-06 ENCOUNTER — Encounter
Admission: EM | Disposition: A | Discharge status: Skilled Nursing Facility | Payer: Self-pay | Source: Home / Self Care | Attending: Internal Medicine

## 2017-08-06 ENCOUNTER — Inpatient Hospital Stay: Payer: Medicare Other | Admitting: Certified Registered Nurse Anesthetist

## 2017-08-06 ENCOUNTER — Inpatient Hospital Stay: Payer: Medicare Other

## 2017-08-06 ENCOUNTER — Other Ambulatory Visit: Payer: Self-pay

## 2017-08-06 DIAGNOSIS — Z01810 Encounter for preprocedural cardiovascular examination: Secondary | ICD-10-CM

## 2017-08-06 HISTORY — PX: HIP ARTHROPLASTY: SHX981

## 2017-08-06 LAB — CBC
HEMATOCRIT: 46 % (ref 40.0–52.0)
Hemoglobin: 15.7 g/dL (ref 13.0–18.0)
MCH: 30.6 pg (ref 26.0–34.0)
MCHC: 34.1 g/dL (ref 32.0–36.0)
MCV: 89.9 fL (ref 80.0–100.0)
Platelets: 177 10*3/uL (ref 150–440)
RBC: 5.12 MIL/uL (ref 4.40–5.90)
RDW: 13.7 % (ref 11.5–14.5)
WBC: 11.4 10*3/uL — ABNORMAL HIGH (ref 3.8–10.6)

## 2017-08-06 LAB — MRSA PCR SCREENING: MRSA by PCR: NEGATIVE

## 2017-08-06 LAB — BASIC METABOLIC PANEL
Anion gap: 5 (ref 5–15)
BUN: 15 mg/dL (ref 6–20)
CHLORIDE: 105 mmol/L (ref 101–111)
CO2: 28 mmol/L (ref 22–32)
CREATININE: 0.87 mg/dL (ref 0.61–1.24)
Calcium: 8.4 mg/dL — ABNORMAL LOW (ref 8.9–10.3)
GFR calc non Af Amer: >60 mL/min (ref >60)
Glucose, Bld: 119 mg/dL — ABNORMAL HIGH (ref 65–99)
POTASSIUM: 3.6 mmol/L (ref 3.5–5.1)
SODIUM: 138 mmol/L (ref 135–145)

## 2017-08-06 SURGERY — HEMIARTHROPLASTY, HIP, DIRECT ANTERIOR APPROACH, FOR FRACTURE
Anesthesia: General | Site: Hip | Laterality: Left | Wound class: Clean

## 2017-08-06 MED ORDER — ONDANSETRON HCL 4 MG PO TABS: 4.0000 mg | ORAL_TABLET | Freq: Four times a day (QID) | ORAL | Status: DC | PRN

## 2017-08-06 MED ORDER — CARBIDOPA-LEVODOPA 25-100 MG PO TABS
2.0000 | ORAL_TABLET | Freq: Every day | ORAL | Status: DC
  Administered 2017-08-06 – 2017-08-08 (×13): 2 via ORAL
  Filled 2017-08-06 (×19): qty 2

## 2017-08-06 MED ORDER — METOCLOPRAMIDE HCL 5 MG/ML IJ SOLN: 5.0000 mg | Freq: Three times a day (TID) | INTRAMUSCULAR | Status: DC | PRN

## 2017-08-06 MED ORDER — CEFAZOLIN SODIUM-DEXTROSE 2-4 GM/100ML-% IV SOLN
INTRAVENOUS | Status: AC
  Filled 2017-08-06: qty 100

## 2017-08-06 MED ORDER — MIDAZOLAM HCL 2 MG/2ML IJ SOLN
INTRAMUSCULAR | Status: DC | PRN
  Administered 2017-08-06: 1 mg via INTRAVENOUS

## 2017-08-06 MED ORDER — TRAMADOL HCL 50 MG PO TABS
50.0000 mg | ORAL_TABLET | Freq: Four times a day (QID) | ORAL | Status: DC | PRN
  Administered 2017-08-07 – 2017-08-08 (×3): 50 mg via ORAL
  Filled 2017-08-06 (×4): qty 1

## 2017-08-06 MED ORDER — SODIUM CHLORIDE 0.9 % IJ SOLN
INTRAMUSCULAR | Status: DC | PRN
  Administered 2017-08-06: 40 mL via INTRAVENOUS

## 2017-08-06 MED ORDER — AMLODIPINE BESYLATE 5 MG PO TABS
5.0000 mg | ORAL_TABLET | Freq: Once | ORAL | Status: AC
  Administered 2017-08-06: 5 mg via ORAL
  Filled 2017-08-06: qty 1

## 2017-08-06 MED ORDER — FENTANYL CITRATE (PF) 100 MCG/2ML IJ SOLN: 25.0000 ug | INTRAMUSCULAR | Status: DC | PRN

## 2017-08-06 MED ORDER — ONDANSETRON HCL 4 MG/2ML IJ SOLN: 4.0000 mg | Freq: Once | INTRAMUSCULAR | Status: DC | PRN

## 2017-08-06 MED ORDER — HYDROMORPHONE HCL 1 MG/ML IJ SOLN: 0.5000 mg | INTRAMUSCULAR | Status: DC | PRN

## 2017-08-06 MED ORDER — ACETAMINOPHEN 325 MG PO TABS: 650.0000 mg | ORAL_TABLET | Freq: Four times a day (QID) | ORAL | Status: DC | PRN

## 2017-08-06 MED ORDER — BISACODYL 10 MG RE SUPP
10.0000 mg | Freq: Every day | RECTAL | Status: DC | PRN
  Administered 2017-08-08: 10 mg via RECTAL
  Filled 2017-08-06 (×2): qty 1

## 2017-08-06 MED ORDER — NEOMYCIN-POLYMYXIN B GU 40-200000 IR SOLN
Status: DC | PRN
  Administered 2017-08-06: 16 mL

## 2017-08-06 MED ORDER — ACETAMINOPHEN 650 MG RE SUPP: 650.0000 mg | Freq: Four times a day (QID) | RECTAL | Status: DC | PRN

## 2017-08-06 MED ORDER — SODIUM CHLORIDE FLUSH 0.9 % IV SOLN
INTRAVENOUS | Status: AC
  Filled 2017-08-06: qty 10

## 2017-08-06 MED ORDER — PANTOPRAZOLE SODIUM 40 MG PO TBEC
40.0000 mg | DELAYED_RELEASE_TABLET | Freq: Every day | ORAL | Status: DC
  Administered 2017-08-07 – 2017-08-10 (×4): 40 mg via ORAL
  Filled 2017-08-06 (×4): qty 1

## 2017-08-06 MED ORDER — OXYCODONE HCL 5 MG PO TABS: 5.0000 mg | ORAL_TABLET | ORAL | Status: DC | PRN

## 2017-08-06 MED ORDER — DIPHENHYDRAMINE HCL 12.5 MG/5ML PO ELIX: 12.5000 mg | ORAL_SOLUTION | ORAL | Status: DC | PRN

## 2017-08-06 MED ORDER — ONDANSETRON HCL 4 MG/2ML IJ SOLN: 4.0000 mg | Freq: Four times a day (QID) | INTRAMUSCULAR | Status: DC | PRN

## 2017-08-06 MED ORDER — BISACODYL 5 MG PO TBEC: 5.0000 mg | DELAYED_RELEASE_TABLET | Freq: Every day | ORAL | Status: DC | PRN

## 2017-08-06 MED ORDER — MORPHINE SULFATE (PF) 2 MG/ML IV SOLN
2.0000 mg | INTRAVENOUS | Status: DC | PRN
  Administered 2017-08-06: 2 mg via INTRAVENOUS
  Filled 2017-08-06: qty 1

## 2017-08-06 MED ORDER — KETOROLAC TROMETHAMINE 30 MG/ML IJ SOLN
30.0000 mg | Freq: Once | INTRAMUSCULAR | Status: AC
  Administered 2017-08-06: 30 mg via INTRAVENOUS

## 2017-08-06 MED ORDER — TRAZODONE HCL 50 MG PO TABS: 25.0000 mg | ORAL_TABLET | Freq: Every evening | ORAL | Status: DC | PRN

## 2017-08-06 MED ORDER — MAGNESIUM HYDROXIDE 400 MG/5ML PO SUSP
30.0000 mL | Freq: Every day | ORAL | Status: DC | PRN
  Filled 2017-08-06: qty 30

## 2017-08-06 MED ORDER — ONDANSETRON HCL 4 MG/2ML IJ SOLN
INTRAMUSCULAR | Status: DC | PRN
Start: 2017-08-06 — End: 2017-08-06
  Administered 2017-08-06: 4 mg via INTRAVENOUS

## 2017-08-06 MED ORDER — EPHEDRINE SULFATE 50 MG/ML IJ SOLN
INTRAMUSCULAR | Status: DC | PRN
Start: 2017-08-06 — End: 2017-08-06
  Administered 2017-08-06 (×2): 10 mg via INTRAVENOUS

## 2017-08-06 MED ORDER — PROPOFOL 10 MG/ML IV BOLUS
INTRAVENOUS | Status: DC | PRN
  Administered 2017-08-06: 120 mg via INTRAVENOUS

## 2017-08-06 MED ORDER — HYDROCODONE-ACETAMINOPHEN 5-325 MG PO TABS
1.0000 | ORAL_TABLET | ORAL | Status: DC | PRN
  Administered 2017-08-06: 2 via ORAL
  Filled 2017-08-06: qty 2

## 2017-08-06 MED ORDER — LIDOCAINE HCL (CARDIAC) PF 100 MG/5ML IV SOSY
PREFILLED_SYRINGE | INTRAVENOUS | Status: DC | PRN
Start: 2017-08-06 — End: 2017-08-06
  Administered 2017-08-06: 80 mg via INTRAVENOUS

## 2017-08-06 MED ORDER — KETOROLAC TROMETHAMINE 15 MG/ML IJ SOLN
15.0000 mg | Freq: Four times a day (QID) | INTRAMUSCULAR | Status: AC
  Administered 2017-08-06 – 2017-08-07 (×3): 15 mg via INTRAVENOUS
  Filled 2017-08-06 (×4): qty 1

## 2017-08-06 MED ORDER — ENOXAPARIN SODIUM 40 MG/0.4ML ~~LOC~~ SOLN
40.0000 mg | SUBCUTANEOUS | Status: DC
  Administered 2017-08-07 – 2017-08-10 (×4): 40 mg via SUBCUTANEOUS
  Filled 2017-08-06 (×4): qty 0.4

## 2017-08-06 MED ORDER — TRANEXAMIC ACID 1000 MG/10ML IV SOLN
INTRAVENOUS | Status: DC | PRN
  Administered 2017-08-06: 1000 mg via INTRAVENOUS

## 2017-08-06 MED ORDER — FENTANYL CITRATE (PF) 100 MCG/2ML IJ SOLN
INTRAMUSCULAR | Status: DC | PRN
  Administered 2017-08-06 (×2): 25 ug via INTRAVENOUS
  Administered 2017-08-06: 50 ug via INTRAVENOUS

## 2017-08-06 MED ORDER — BUPIVACAINE LIPOSOME 1.3 % IJ SUSP
INTRAMUSCULAR | Status: DC | PRN
  Administered 2017-08-06: 20 mL

## 2017-08-06 MED ORDER — PHENYLEPHRINE HCL 10 MG/ML IJ SOLN
INTRAMUSCULAR | Status: DC | PRN
  Administered 2017-08-06 (×3): 100 ug via INTRAVENOUS

## 2017-08-06 MED ORDER — CEFAZOLIN SODIUM-DEXTROSE 2-4 GM/100ML-% IV SOLN
2.0000 g | Freq: Four times a day (QID) | INTRAVENOUS | Status: AC
  Administered 2017-08-06 – 2017-08-07 (×3): 2 g via INTRAVENOUS
  Filled 2017-08-06 (×3): qty 100

## 2017-08-06 MED ORDER — BUPIVACAINE-EPINEPHRINE (PF) 0.25% -1:200000 IJ SOLN
INTRAMUSCULAR | Status: DC | PRN
  Administered 2017-08-06: 30 mL via PERINEURAL

## 2017-08-06 MED ORDER — DOCUSATE SODIUM 100 MG PO CAPS
100.0000 mg | ORAL_CAPSULE | Freq: Two times a day (BID) | ORAL | Status: DC
  Administered 2017-08-06 – 2017-08-10 (×5): 100 mg via ORAL
  Filled 2017-08-06 (×8): qty 1

## 2017-08-06 MED ORDER — DOCUSATE SODIUM 100 MG PO CAPS
100.0000 mg | ORAL_CAPSULE | Freq: Two times a day (BID) | ORAL | Status: DC
  Administered 2017-08-07 – 2017-08-10 (×6): 100 mg via ORAL
  Filled 2017-08-06 (×4): qty 1

## 2017-08-06 MED ORDER — LACTATED RINGERS IV SOLN
INTRAVENOUS | Status: DC | PRN
  Administered 2017-08-06 (×2): via INTRAVENOUS

## 2017-08-06 MED ORDER — ROCURONIUM BROMIDE 100 MG/10ML IV SOLN
INTRAVENOUS | Status: DC | PRN
  Administered 2017-08-06: 50 mg via INTRAVENOUS

## 2017-08-06 MED ORDER — FLEET ENEMA 7-19 GM/118ML RE ENEM: 1.0000 | ENEMA | Freq: Once | RECTAL | Status: DC | PRN

## 2017-08-06 MED ORDER — SODIUM CHLORIDE FLUSH 0.9 % IV SOLN
INTRAVENOUS | Status: AC
  Filled 2017-08-06: qty 40

## 2017-08-06 MED ORDER — METOCLOPRAMIDE HCL 10 MG PO TABS: 5.0000 mg | ORAL_TABLET | Freq: Three times a day (TID) | ORAL | Status: DC | PRN

## 2017-08-06 MED ORDER — KETOROLAC TROMETHAMINE 30 MG/ML IJ SOLN
INTRAMUSCULAR | Status: AC
  Administered 2017-08-06: 30 mg via INTRAVENOUS
  Filled 2017-08-06: qty 1

## 2017-08-06 MED ORDER — ENTACAPONE 200 MG PO TABS
200.0000 mg | ORAL_TABLET | Freq: Four times a day (QID) | ORAL | Status: DC
  Administered 2017-08-06 – 2017-08-10 (×15): 200 mg via ORAL
  Filled 2017-08-06 (×20): qty 1

## 2017-08-06 MED ORDER — MIRABEGRON ER 25 MG PO TB24
25.0000 mg | ORAL_TABLET | Freq: Every day | ORAL | Status: DC
  Administered 2017-08-07 – 2017-08-10 (×4): 25 mg via ORAL
  Filled 2017-08-06 (×4): qty 1

## 2017-08-06 MED ORDER — SODIUM CHLORIDE 0.9 % IV SOLN
INTRAVENOUS | Status: DC
  Administered 2017-08-06 (×2): via INTRAVENOUS

## 2017-08-06 MED ORDER — DONEPEZIL HCL 5 MG PO TABS
10.0000 mg | ORAL_TABLET | Freq: Every evening | ORAL | Status: DC
  Administered 2017-08-06 – 2017-08-09 (×4): 10 mg via ORAL
  Filled 2017-08-06 (×5): qty 2

## 2017-08-06 SURGICAL SUPPLY — 59 items
BAG DECANTER FOR FLEXI CONT (MISCELLANEOUS) IMPLANT
BLADE SAGITTAL WIDE XTHICK NO (BLADE) ×3 IMPLANT
BLADE SURG SZ20 CARB STEEL (BLADE) ×3 IMPLANT
BNDG COHESIVE 6X5 TAN STRL LF (GAUZE/BANDAGES/DRESSINGS) ×3 IMPLANT
BOWL CEMENT MIXING ADV NOZZLE (MISCELLANEOUS) IMPLANT
CANISTER SUCT 1200ML W/VALVE (MISCELLANEOUS) ×3 IMPLANT
CANISTER SUCT 3000ML PPV (MISCELLANEOUS) ×6 IMPLANT
CHLORAPREP W/TINT 26ML (MISCELLANEOUS) ×6 IMPLANT
DECANTER SPIKE VIAL GLASS SM (MISCELLANEOUS) ×6 IMPLANT
DRAPE IMP U-DRAPE 54X76 (DRAPES) ×6 IMPLANT
DRAPE INCISE IOBAN 66X60 STRL (DRAPES) ×3 IMPLANT
DRAPE SHEET LG 3/4 BI-LAMINATE (DRAPES) ×3 IMPLANT
DRAPE SURG 17X11 SM STRL (DRAPES) ×3 IMPLANT
DRAPE SURG 17X23 STRL (DRAPES) ×3 IMPLANT
DRSG OPSITE POSTOP 4X12 (GAUZE/BANDAGES/DRESSINGS) ×3 IMPLANT
DRSG OPSITE POSTOP 4X14 (GAUZE/BANDAGES/DRESSINGS) IMPLANT
ELECT BLADE 6.5 EXT (BLADE) ×3 IMPLANT
ELECT CAUTERY BLADE 6.4 (BLADE) ×3 IMPLANT
ELECT REM PT RETURN 9FT ADLT (ELECTROSURGICAL) ×3
ELECTRODE REM PT RTRN 9FT ADLT (ELECTROSURGICAL) ×1 IMPLANT
GAUZE PACK 2X3YD (MISCELLANEOUS) IMPLANT
GLOVE BIO SURGEON STRL SZ8 (GLOVE) ×6 IMPLANT
GLOVE INDICATOR 8.0 STRL GRN (GLOVE) ×3 IMPLANT
GOWN STRL REUS W/ TWL LRG LVL3 (GOWN DISPOSABLE) ×1 IMPLANT
GOWN STRL REUS W/ TWL XL LVL3 (GOWN DISPOSABLE) ×1 IMPLANT
GOWN STRL REUS W/TWL LRG LVL3 (GOWN DISPOSABLE) ×2
GOWN STRL REUS W/TWL XL LVL3 (GOWN DISPOSABLE) ×2
HEAD ENDO II MOD SZ 52 (Orthopedic Implant) ×3 IMPLANT
HOOD PEEL AWAY FLYTE STAYCOOL (MISCELLANEOUS) ×6 IMPLANT
INSERT TAPER ENDO II STD (Orthopedic Implant) ×3 IMPLANT
IV NS 100ML SINGLE PACK (IV SOLUTION) IMPLANT
LABEL OR SOLS (LABEL) ×3 IMPLANT
NDL SAFETY ECLIPSE 18X1.5 (NEEDLE) ×1 IMPLANT
NEEDLE FILTER BLUNT 18X 1/2SAF (NEEDLE) ×2
NEEDLE FILTER BLUNT 18X1 1/2 (NEEDLE) ×1 IMPLANT
NEEDLE HYPO 18GX1.5 SHARP (NEEDLE) ×2
NEEDLE SPNL 20GX3.5 QUINCKE YW (NEEDLE) ×3 IMPLANT
NS IRRIG 1000ML POUR BTL (IV SOLUTION) ×3 IMPLANT
PACK HIP PROSTHESIS (MISCELLANEOUS) ×3 IMPLANT
PILLOW ABDUC SM (MISCELLANEOUS) ×3 IMPLANT
PULSAVAC PLUS IRRIG FAN TIP (DISPOSABLE) ×3
SOL .9 NS 3000ML IRR  AL (IV SOLUTION) ×4
SOL .9 NS 3000ML IRR UROMATIC (IV SOLUTION) ×2 IMPLANT
STAPLER SKIN PROX 35W (STAPLE) ×3 IMPLANT
STEM COLLARLESS RED 13X145MM (Stem) ×3 IMPLANT
STRAP SAFETY 5IN WIDE (MISCELLANEOUS) ×3 IMPLANT
SUT ETHIBOND 2 V 37 (SUTURE) ×9 IMPLANT
SUT VIC AB 1 CT1 36 (SUTURE) ×6 IMPLANT
SUT VIC AB 2-0 CT1 (SUTURE) ×9 IMPLANT
SUT VIC AB 2-0 CT1 27 (SUTURE) ×6
SUT VIC AB 2-0 CT1 TAPERPNT 27 (SUTURE) ×3 IMPLANT
SUT VICRYL 1-0 27IN ABS (SUTURE) ×6
SUTURE VICRYL 1-0 27IN ABS (SUTURE) ×2 IMPLANT
SYR 10ML LL (SYRINGE) ×3 IMPLANT
SYR 30ML LL (SYRINGE) ×9 IMPLANT
SYR TB 1ML 27GX1/2 LL (SYRINGE) IMPLANT
TAPE TRANSPORE STRL 2 31045 (GAUZE/BANDAGES/DRESSINGS) ×3 IMPLANT
TIP BRUSH PULSAVAC PLUS 24.33 (MISCELLANEOUS) ×3 IMPLANT
TIP FAN IRRIG PULSAVAC PLUS (DISPOSABLE) ×1 IMPLANT

## 2017-08-06 NOTE — Progress Notes (Signed)
[facility] [place] NAME: Lucas Edwards    MR#:  [id number]  DATE OF BIRTH:  1950-05-27  SUBJECTIVE:  CHIEF COMPLAINT:   Chief Complaint  Patient presents with  . Fall   -Patient is from independent living facility, had a mechanical fall as he lost his balance coming down stairs.  Has a left hip femoral neck fracture. -Complains of pain.  Going for surgery today  REVIEW OF SYSTEMS:  Review of Systems  Constitutional: Negative for chills, fever and malaise/fatigue.  HENT: Positive for hearing loss. Negative for congestion, ear discharge and nosebleeds.   Eyes: Negative for blurred vision and double vision.  Respiratory: Negative for cough, shortness of breath and wheezing.   Cardiovascular: Negative for chest pain and palpitations.  Gastrointestinal: Negative for abdominal pain, constipation, diarrhea, nausea and vomiting.  Genitourinary: Negative for dysuria.  Musculoskeletal: Positive for joint pain and myalgias.  Neurological: Negative for dizziness, focal weakness, seizures, weakness and headaches.  Psychiatric/Behavioral: Negative for depression.    DRUG ALLERGIES:   Allergies  Allergen Reactions  . Sulfa Antibiotics Hives  . Tylenol [Acetaminophen] Itching    VITALS:  Blood pressure 113/80, pulse 89, temperature 98.5 F (36.9 C), temperature source Tympanic, resp. rate 18, height 5\' 9"  (1.753 m), weight 74.8 kg (165 lb), SpO2 96 %.  PHYSICAL EXAMINATION:  Physical Exam  GENERAL:  67 y.o.-year-old patient lying in the bed with no acute distress.  EYES: Pupils equal, round, reactive to light and accommodation. No scleral icterus. Extraocular muscles intact.  HEENT: Head atraumatic, normocephalic. Oropharynx and nasopharynx clear.  NECK:  Supple, no jugular venous distention. No thyroid enlargement, no tenderness.  LUNGS: Normal breath sounds bilaterally, no wheezing, rales,rhonchi or crepitation. No use of accessory  muscles of respiration. Decreased bibasilar breath sounds CARDIOVASCULAR: S1, S2 normal. No  rubs, or gallops. 3/6 systolic murmur presnt ABDOMEN: Soft, nontender, nondistended. Bowel sounds present. No organomegaly or mass.  EXTREMITIES: No pedal edema, cyanosis, or clubbing.  Left leg is abducted and externally rotated NEUROLOGIC: Cranial nerves II through XII are intact. Muscle strength 5/5 in all extremities except left lower extremity due to pain. Sensation intact. Gait not checked.  PSYCHIATRIC: The patient is alert and oriented x 2.  SKIN: No obvious rash, lesion, or ulcer.    LABORATORY PANEL:   CBC Recent Labs  Lab 08/06/17 0354  WBC 11.4*  HGB 15.7  HCT 46.0  PLT 177   ------------------------------------------------------------------------------------------------------------------  Chemistries  Recent Labs  Lab 07/31/17 0813  08/06/17 0354  NA 137   < > 138  K 3.5   < > 3.6  CL 100*   < > 105  CO2 29   < > 28  GLUCOSE 105*   < > 119*  BUN 10   < > 15  CREATININE 0.80   < > 0.87  CALCIUM 8.9   < > 8.4*  AST 27  --   --   ALT 23  --   --   ALKPHOS 70  --   --   BILITOT 1.2  --   --    < > = values in this interval not displayed.   ------------------------------------------------------------------------------------------------------------------  Cardiac Enzymes No results for input(s): TROPONINI in the last 168 hours. ------------------------------------------------------------------------------------------------------------------  RADIOLOGY:  Dg Wrist Complete Left  Result Date: 08/05/2017 CLINICAL DATA:  Pain after unwitnessed fall. EXAM: LEFT WRIST - COMPLETE 3+ VIEW COMPARISON:  Report from left hand radiograph from 04/05/1999.  FINDINGS: There is no evidence of fracture or dislocation. There is no evidence of arthropathy. Old posttraumatic deformity of the third distal phalanx. Soft tissues are unremarkable. IMPRESSION: No acute fracture identified. No  malalignment is noted. Remote posttraumatic deformity of the third distal phalanx. Electronically Signed   By: Ashley Royalty M.D.   On: 08/05/2017 22:02   Dg Hand 2 View Left  Result Date: 08/05/2017 CLINICAL DATA:  Unwitnessed fall.  Left wrist pain. EXAM: LEFT HAND - 2 VIEW COMPARISON:  Left hand report from 04/05/1999 FINDINGS: Tapered appearance of the third distal phalanx with thin overlying soft tissues. 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 Findings are therefore consistent with remote posttraumatic change and less likely due to an inflammatory arthropathy, in particular psoriasis. No acute fracture or bone destruction. No joint dislocation. IMPRESSION: 1. No acute fracture of the left hand and wrist. 2. Remote posttraumatic deformity of the distal phalanx of the left middle finger. Electronically Signed   By: Ashley Royalty M.D.   On: 08/05/2017 21:51   Dg Chest Portable 1 View  Result Date: 08/05/2017 CLINICAL DATA:  Preop hip fracture. EXAM: PORTABLE CHEST 1 VIEW COMPARISON:  10/19/2015 FINDINGS: The heart size and mediastinal contours are within normal limits. No pulmonary consolidation, effusion or pneumothorax. The visualized skeletal structures are unremarkable. IMPRESSION: No active disease. Electronically Signed   By: Ashley Royalty M.D.   On: 08/05/2017 22:00   Dg Hip Unilat W Or Wo Pelvis 2-3 Views Left  Result Date: 08/05/2017 CLINICAL DATA:  Left hip pain after unwitnessed fall EXAM: DG HIP (WITH OR WITHOUT PELVIS) 2-3V LEFT COMPARISON:  None. FINDINGS: Acute, closed, varus angulated transcervical fracture of the left femur. No hip joint dislocation. 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 Bony pelvis appears intact. No pelvic diastasis. Left inguinal hernia repair with tacks in place. IMPRESSION: 1. Acute left transcervical fracture of the femur with varus angulation. No hip joint dislocation. 2. The  bony pelvis appears intact. 3. 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 Electronically Signed   By: Ashley Royalty M.D.   On: 08/05/2017 21:48    EKG:   Orders placed or performed during the hospital encounter of 08/05/17  . EKG 12-Lead  . EKG 12-Lead  . EKG 12-Lead  . EKG 12-Lead    ASSESSMENT AND PLAN:   67 year old male with past medical history significant for hypertension, early dementia, Parkinson's disease who is at an independent living facility was brought in secondary to a mechanical fall  1.  Left femoral neck fracture-secondary to mechanical fall -Appreciate orthopedic consult -Pain control, physical therapy consult after surgery -Going for surgery today.  Low cardiac risk for surgery  2.  Hypertension-blood pressure is stable.  On norvasc.  Monitor  3.  Dementia and Parkinson's-continue outpatient follow-up.  Patient on Aricept, entacapone and sinemet  4. GERD- protonix  5. DVT Prophylaxis- will be started after surgery   All the records are reviewed and case discussed with Care Management/Social Workerr. Management plans discussed with the patient, family and they are in agreement.  CODE STATUS: DNR  TOTAL TIME TAKING CARE OF THIS PATIENT: 38 minutes.   POSSIBLE D/C IN 2-3 DAYS, DEPENDING ON CLINICAL CONDITION.   Gladstone Lighter M.D on 08/06/2017 at 1:09 PM  Between 7am to 6pm - Pager - [phone]  After 6pm go to [email or web address] - [place]  [facility]  Office  [phone]  CC: Primary care physician; Dion Body, MD

## 2017-08-06 NOTE — Consult Note (Signed)
ORTHOPAEDIC CONSULTATION  REQUESTING PHYSICIAN: Gladstone Lighter, MD  Chief Complaint:   Left hip pain.  History of Present Illness: Lucas Edwards is a 67 y.o. male with a history of hypertension, hyperlipidemia, Parkinson's disease, and early dementia who lives at an assisted living facility.  Apparently he lost his balance and fell yesterday evening, landing on his left side.  He was brought to the emergency room where x-rays demonstrated a varus impacted left femoral neck fracture.  The patient was admitted to the hospital service for medical clearance prior to definitive management of this injury.  The patient denies any associated injuries resulting from the fall.  He did not strike his head or lose consciousness.  In addition, he denies any lightheadedness, dizziness, chest pain, shortness of breath, or other symptoms may have precipitated his fall.  The patient is a DNR.  His healthcare proxy is his niece, Lucas Edwards, whose cell phone number is [phone].  Past Medical History:  Diagnosis Date  . Abdominal hernia   . Chest pain    a. 2001 Cath:  nl cors per pt.  Marland Kitchen GERD (gastroesophageal reflux disease)   . History of hypertension   . Hyperlipidemia   . Parkinson disease [facility])    Past Surgical History:  Procedure Laterality Date  . INGUINAL HERNIA REPAIR Bilateral   . VASECTOMY     Social History   Socioeconomic History  . Marital status: Widowed    Spouse name: Not on file  . Number of children: Not on file  . Years of education: Not on file  . Highest education level: Not on file  Occupational History  . Occupation: disabled  Social Needs  . Financial resource strain: Not on file  . Food insecurity:    Worry: Not on file    Inability: Not on file  . Transportation needs:    Medical: Not on file    Non-medical: Not on file  Tobacco Use  . Smoking status: Former Research scientist (life sciences)  . Smokeless tobacco:  Never Used  . Tobacco comment: during high school/college  Substance and Sexual Activity  . Alcohol use: Yes    Comment: rare - binges about once/yr or less.  . Drug use: No    Comment: marijuana occ  . Sexual activity: Never  Lifestyle  . Physical activity:    Days per week: Not on file    Minutes per session: Not on file  . Stress: Not on file  Relationships  . Social connections:    Talks on phone: Not on file    Gets together: Not on file    Attends religious service: Not on file    Active member of club or organization: Not on file    Attends meetings of clubs or organizations: Not on file    Relationship status: Not on file  Other Topics Concern  . Not on file  Social History Narrative   Lives @ [place].  Exercises most days of the week.   Family History  Problem Relation Age of Onset  . Multiple sclerosis Mother        died @ 65  . Stroke Father        died @ 58  . Heart attack Father   . Lung cancer Sister        died in her 64's   Allergies  Allergen Reactions  . Sulfa Antibiotics Hives  . Tylenol [Acetaminophen] Itching   Prior to Admission medications   Medication Sig Start  Date End Date Taking? Authorizing Provider  carbidopa-levodopa (SINEMET IR) 25-100 MG tablet Take 2 tablets by mouth. TAKE 2 TABLETS BY MOUTH SIX TIMES DAILY AND TAKE 1 ADDITIONAL TABLET BY MOUTH AT BEDTIME   Yes [provider]  docusate sodium (COLACE) 100 MG capsule Take 100 mg by mouth 4 (four) times daily.    Yes [provider]  donepezil (ARICEPT) 10 MG tablet Take 10 mg by mouth every evening.    Yes [provider]  entacapone (COMTAN) 200 MG tablet Take 200 mg by mouth 4 (four) times daily.   Yes [provider]  MYRBETRIQ 25 MG TB24 tablet Take 25 mg by mouth daily. 04/29/17  Yes [provider]  pantoprazole (PROTONIX) 40 MG tablet Take 40 mg by mouth daily.  10/21/13  Yes [provider]   Dg Wrist Complete  Left  Result Date: 08/05/2017 CLINICAL DATA:  Pain after unwitnessed fall. EXAM: LEFT WRIST - COMPLETE 3+ VIEW COMPARISON:  Report from left hand radiograph from 04/05/1999. FINDINGS: There is no evidence of fracture or dislocation. There is no evidence of arthropathy. Old posttraumatic deformity of the third distal phalanx. Soft tissues are unremarkable. IMPRESSION: No acute fracture identified. No malalignment is noted. Remote posttraumatic deformity of the third distal phalanx. Electronically Signed   By: Ashley Royalty M.D.   On: 08/05/2017 22:02   Dg Hand 2 View Left  Result Date: 08/05/2017 CLINICAL DATA:  Unwitnessed fall.  Left wrist pain. EXAM: LEFT HAND - 2 VIEW COMPARISON:  Left hand report from 04/05/1999 FINDINGS: Tapered appearance of the third distal phalanx with thin overlying soft tissues. 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 Findings are therefore consistent with remote posttraumatic change and less likely due to an inflammatory arthropathy, in particular psoriasis. No acute fracture or bone destruction. No joint dislocation. IMPRESSION: 1. No acute fracture of the left hand and wrist. 2. Remote posttraumatic deformity of the distal phalanx of the left middle finger. Electronically Signed   By: Ashley Royalty M.D.   On: 08/05/2017 21:51   Dg Chest Portable 1 View  Result Date: 08/05/2017 CLINICAL DATA:  Preop hip fracture. EXAM: PORTABLE CHEST 1 VIEW COMPARISON:  10/19/2015 FINDINGS: The heart size and mediastinal contours are within normal limits. No pulmonary consolidation, effusion or pneumothorax. The visualized skeletal structures are unremarkable. IMPRESSION: No active disease. Electronically Signed   By: Ashley Royalty M.D.   On: 08/05/2017 22:00   Dg Hip Unilat W Or Wo Pelvis 2-3 Views Left  Result Date: 08/05/2017 CLINICAL DATA:  Left hip pain after unwitnessed fall EXAM: DG HIP (WITH OR WITHOUT PELVIS) 2-3V LEFT COMPARISON:  None.  FINDINGS: Acute, closed, varus angulated transcervical fracture of the left femur. No hip joint dislocation. 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 Bony pelvis appears intact. No pelvic diastasis. Left inguinal hernia repair with tacks in place. IMPRESSION: 1. Acute left transcervical fracture of the femur with varus angulation. No hip joint dislocation. 2. The bony pelvis appears intact. 3. 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 Electronically Signed   By: Ashley Royalty M.D.   On: 08/05/2017 21:48    Positive ROS: All other systems have been reviewed and were otherwise negative with the exception of those mentioned in the HPI and as above.  Physical Exam: General:  Alert, no acute distress Psychiatric:  Patient exhibits normal mood and affect  Cardiovascular:  No pedal edema Respiratory:  No wheezing, non-labored breathing GI:  Abdomen is soft and non-tender Skin:  No lesions in the area of chief complaint Neurologic:  Sensation intact distally Lymphatic:  No axillary or cervical lymphadenopathy  Orthopedic Exam:  Orthopedic examination is limited to the left hip and lower extremity.  The left lower extremity somewhat shortened and externally rotated as compared to the right.  Skin inspection around the left hip is unremarkable.  There is no swelling, erythema, ecchymosis, or other skin abnormalities.  He has mild tenderness to palpation around the hip.  He has more severe pain with any attempted active or passive motion of the hip.  He is able to actively dorsiflex and plantarflex his toes and ankle.  Sensation is intact light touch to all distributions of his left lower extremity and foot.  He has good capillary refill to his left foot.  X-rays:  X-rays of the pelvis and left hip are available for review.  These films demonstrate a varus displaced left femoral neck fracture.  No significant degenerative changes  of the hip joint are noted.  No lytic lesions are identified.  Assessment: Displaced left femoral neck fracture.  Plan: The treatment options have been discussed with the patient and well as with his niece, Lucas Edwards, who is his power of attorney and healthcare proxy.  Both the surgical and nonsurgical options have been discussed.  They would like to proceed with surgical intervention to include a left hip hemiarthroplasty.  This procedure has been discussed in detail, as have the potential risks (including bleeding, infection, nerve and blood vessel injury, persistent recurrent pain, stiffness, leg length inequality, dislocation, loosening of and/or failure of the components, need for further surgery, blood clots, strokes, heart attacks and arrhythmias, etc.) and benefits.  The patient and his niece state their understanding and expressed their desire to proceed with surgery.  A formal written consent will be obtained by the nursing staff.  Thank you for asked me to participate in the care of this most pleasant yet unfortunate man.  I will be happy to follow him with you.   Pascal Lux, MD  Beeper #:  [phone]  08/06/2017 7:49 AM

## 2017-08-06 NOTE — Transfer of Care (Signed)
Immediate Anesthesia Transfer of Care Note  Patient: Lucas Edwards  Procedure(s) Performed: ARTHROPLASTY BIPOLAR HIP (HEMIARTHROPLASTY) (Left Hip)  Patient Location: PACU  Anesthesia Type:General  Level of Consciousness: drowsy  Airway & Oxygen Therapy: Patient Spontanous Breathing and Patient connected to T-piece oxygen  Post-op Assessment: Report given to RN  Post vital signs: stable  Last Vitals:  Vitals Value Taken Time  BP 143/90 08/06/2017  2:56 PM  Temp    Pulse 71 08/06/2017  2:57 PM  Resp 12 08/06/2017  2:57 PM  SpO2 98 % 08/06/2017  2:57 PM  Vitals shown include unvalidated device data.  Last Pain:  Vitals:   08/06/17 1209  TempSrc: Tympanic  PainSc: 5          Complications: No apparent anesthesia complications

## 2017-08-06 NOTE — Anesthesia Preprocedure Evaluation (Signed)
Anesthesia Evaluation  Patient identified by MRN, date of birth, ID band Patient awake    Reviewed: Allergy & Precautions, H&P , NPO status , Patient's Chart, lab work & pertinent test results, reviewed documented beta blocker date and time   Airway Mallampati: III  TM Distance: <3 FB Neck ROM: full    Dental  (+) Teeth Intact   Pulmonary neg pulmonary ROS, former smoker,    Pulmonary exam normal        Cardiovascular Exercise Tolerance: Good + angina with exertion negative cardio ROS Normal cardiovascular exam Rhythm:regular Rate:Normal     Neuro/Psych negative neurological ROS  negative psych ROS   GI/Hepatic negative GI ROS, Neg liver ROS, GERD  Medicated,  Endo/Other  negative endocrine ROS  Renal/GU negative Renal ROS  negative genitourinary   Musculoskeletal   Abdominal   Peds  Hematology negative hematology ROS (+)   Anesthesia Other Findings Past Medical History: No date: Abdominal hernia No date: Chest pain     Comment:  a. 2001 Cath:  nl cors per pt. No date: GERD (gastroesophageal reflux disease) No date: History of hypertension No date: Hyperlipidemia No date: Parkinson disease [facility]) Past Surgical History: No date: INGUINAL HERNIA REPAIR; Bilateral No date: VASECTOMY BMI    Body Mass Index:  24.37 kg/m     Reproductive/Obstetrics negative OB ROS                             Anesthesia Physical Anesthesia Plan  ASA: III and emergent  Anesthesia Plan: General ETT   Post-op Pain Management:    Induction:   PONV Risk Score and Plan:   Airway Management Planned:   Additional Equipment:   Intra-op Plan:   Post-operative Plan:   Informed Consent: I have reviewed the patients History and Physical, chart, labs and discussed the procedure including the risks, benefits and alternatives for the proposed anesthesia with the patient or authorized representative who  has indicated his/her understanding and acceptance.   Dental Advisory Given  Plan Discussed with: CRNA  Anesthesia Plan Comments: (RB of RA vs GA discussed with patient.  He refuses SAB.  Will proceed with GOT.Marland KitchenMarland KitchenJA)        Anesthesia Quick Evaluation

## 2017-08-06 NOTE — Clinical Social Work Placement (Signed)
   CLINICAL SOCIAL WORK PLACEMENT  NOTE  Date:  08/06/2017  Patient Details  Name: Lucas Edwards MRN: [id number] Date of Birth: 1950-12-18  Clinical Social Work is seeking post-discharge placement for this patient at the [place] level of care (*CSW will initial, date and re-position this form in  chart as items are completed):  Yes   Patient/family provided with [place] Work Department's list of facilities offering this level of care within the geographic area requested by the patient (or if unable, by the patient's family).  Yes   Patient/family informed of their freedom to choose among providers that offer the needed level of care, that participate in Medicare, Medicaid or managed care program needed by the patient, have an available bed and are willing to accept the patient.  Yes   Patient/family informed of [facility]'s ownership interest in [facility] and [facility], as well as of the fact that they are under no obligation to receive care at these facilities.  PASRR submitted to EDS on 08/06/17     PASRR number received on 08/06/17     Existing PASRR number confirmed on       FL2 transmitted to all facilities in geographic area requested by pt/family on 08/06/17     FL2 transmitted to all facilities within larger geographic area on       Patient informed that his/her managed care company has contracts with or will negotiate with certain facilities, including the following:        Yes   Patient/family informed of bed offers received.  Patient chooses bed at [facility] )     Physician recommends and patient chooses bed at      Patient to be transferred to   on  .  Patient to be transferred to facility by       Patient family notified on   of transfer.  Name of family member notified:        PHYSICIAN       Additional Comment:    _______________________________________________ Shanara Schnieders, Veronia Beets,  LCSW 08/06/2017, 3:49 PM

## 2017-08-06 NOTE — Plan of Care (Addendum)
Patient IV patent. RN assessment and VS revealed stability for Transfer to OR. Report called and s/w Hermenia Bers, RN.  Patient negative for MRSA.  CHG x2 completed. All personal items removed. No dentures or hearing aids in place.  All meds held d/t to NPO since midnight - EXCEPT, parkinson meds given per anesthesia confiirmation w/ LARGE sip of water (since has difficulty swallowing).  Consents x2 signed via phone consent from Niece/POA (Per patient's request). SCDs in place. Morphine IV last given at 1140.

## 2017-08-06 NOTE — Anesthesia Post-op Follow-up Note (Signed)
Anesthesia QCDR form completed.        

## 2017-08-06 NOTE — ED Notes (Signed)
Patient transported to 158

## 2017-08-06 NOTE — Anesthesia Procedure Notes (Addendum)
Procedure Name: Intubation Date/Time: 08/06/2017 1:00 PM Performed by: Dawayne Cirri I, CRNA Pre-anesthesia Checklist: Patient identified, Patient being monitored, Timeout performed, Emergency Drugs available and Suction available Patient Re-evaluated:Patient Re-evaluated prior to induction Oxygen Delivery Method: Circle system utilized Preoxygenation: Pre-oxygenation with 100% oxygen Induction Type: IV induction Ventilation: Mask ventilation without difficulty Laryngoscope Size: Mac and 3 Grade View: Grade I Tube type: Oral Tube size: 7.5 mm Number of attempts: 1 Airway Equipment and Method: Stylet Placement Confirmation: ETT inserted through vocal cords under direct vision,  positive ETCO2 and breath sounds checked- equal and bilateral Secured at: 21 cm Tube secured with: Tape Dental Injury: Teeth and Oropharynx as per pre-operative assessment

## 2017-08-06 NOTE — Op Note (Signed)
08/05/2017 - 08/06/2017  2:44 PM  Patient:   Lucas Edwards  Pre-Op Diagnosis:   Displaced femoral neck fracture, left hip.  Post-Op Diagnosis:   Same.  Procedure:   Left hip unipolar hemiarthroplasty.  Surgeon:   Pascal Lux, MD  Assistant:   Cameron Proud, PA-C  Anesthesia:   GET  Findings:   As above.  Complications:   None  EBL:   150 cc  Fluids:   1900 cc crystalloid  UOP:   None  TT:   None  Drains:   None  Closure:   Staples  Implants:   Biomet press-fit system with a #13 standard offset Echo femoral stem, a 52 mm outer diameter shell, and a -3 mm neck  Brief Clinical Note:   The patient is a 67 year old male with a history of Parkinson's disease, hypertension, and early Alzheimer's disease who lives in a rehab facility.  Apparently, he lost his balance and fell yesterday, resulting in the above-noted injury. The patient has been cleared medically and presents at this time for definitive management of the injury.  Procedure:   The patient was brought into the operating room. After adequate general endotracheal intubation and anesthesia was obtained, the patient was repositioned in the right lateral decubitus position and secured using a lateral hip positioner. The left hip and lower extremity were prepped with ChloroPrep solution before being draped sterilely. Preoperative antibiotics were administered. A timeout was performed to verify the appropriate surgical site before a standard posterior approach to the hip was made through an approximately 4-5 inch incision. The incision was carried down through the subcutaneous tissues to expose the gluteal fascia and proximal end of the iliotibial band. These structures were split the length of the incision and the Charnley self-retaining hip retractor placed. The bursal tissues were swept posteriorly to expose the short external rotators. The anterior border of the piriformis tendon was identified and this plane developed  down through the capsule to enter the joint. Abundant fracture hematoma was suctioned. A flap of tissue was elevated off the posterior aspect of the femoral neck and greater trochanter and retracted posteriorly. This flap included the piriformis tendon, the short external rotators, and the posterior capsule. The femoral head was removed in its entirety, then taken to the back table where it was measured and found to be optimally replicated by a 52 mm head. The appropriate trial head was inserted and found to demonstrate an excellent suction fit.   Attention was directed to the femoral side. The femoral neck was recut 10-12 mm above the lesser trochanter using an oscillating saw. The piriformis fossa was debrided of soft tissues before the intramedullary canal was accessed through this point using a triple step reamer. The canal was reamed sequentially beginning with a #7 tapered reamer and progressing to a #13 tapered reamer. This provided excellent circumferential chatter. A box osteotome was used to establish version before the canal was broached sequentially beginning with a #10 broach and progressing to a #13 broach. This was left in place and several trial reductions performed. The permanent #13 femoral stem was impacted into place. A repeat trial reduction was performed using both the -6 mm and -3 mm neck lengths. The -3 mm neck length demonstrated excellent stability both in extension and external rotation as well as with flexion to 90 and internal rotation beyond 70. It also was stable in the position of sleep. The 52 mm outer diameter shell with the -3 mm neck adapter  construct was put together on the back table before being impacted onto the stem of the femoral component. The Morse taper locking mechanism was verified using manual distraction before the head was relocated and placed through a range of motion with the findings as described above.  The wound was copiously irrigated with bacitracin  saline solution via the jet lavage system before the peri-incisional and pericapsular tissues were injected with 30 cc of 0.5% Sensorcaine with epinephrine and 20 cc of Exparel diluted out to 60 cc with normal saline to help with postoperative analgesia. The posterior flap was reapproximated to the posterior aspect of the greater trochanter using #2 Tycron interrupted sutures placed through drill holes. Several additional #2 Tycron interrupted sutures were used to reinforce this layer of closure. The iliotibial band was reapproximated using #1 Vicryl interrupted sutures before the gluteal fascia was closed using a running #1 Vicryl suture. At this point, 1 g of transexemic acid in 10 cc of normal saline was injected into the joint to help reduce postoperative bleeding. The subcutaneous tissues were closed in several layers using 2-0 Vicryl interrupted sutures before the skin was closed using staples. A sterile occlusive dressing was applied to the wound before the patient was placed into an abduction wedge pillow. The patient was then rolled back into the supine position on the hospital bed before being awakened, extubated, and returned to the recovery room in satisfactory condition after tolerating the procedure well.

## 2017-08-06 NOTE — NC FL2 (Addendum)
[place] LEVEL OF CARE SCREENING TOOL     IDENTIFICATION  Patient Name: Lucas Edwards Birthdate: 08-15-1950 Sex: male Admission Date (Current Location): 08/05/2017  [place] and [place] Number:  Engineering geologist and Address:  [facility], [address] [place], [place] [address]      Provider Number: [id number]  Attending Physician Name and Address:  Gladstone Lighter, MD  Relative Name and Phone Number:       Current Level of Care: Hospital Recommended Level of Care: [facility] Prior Approval Number:    Date Approved/Denied:   PASRR Number: [id number])  Discharge Plan: SNF    Current Diagnoses: Patient Active Problem List   Diagnosis Date Noted  . Hip fx ([place]) 08/05/2017  . Unstable angina ([place]) 05/14/2017  . Paralysis agitans ([place]) 10/24/2013  . REM behavioral disorder 10/24/2013    Orientation RESPIRATION BLADDER Height & Weight     Self, Time, Situation, Place  O2(3 Liters Oxygen. ) Continent Weight: 165 lb (74.8 kg) Height:  5\' 9"  (175.3 cm)  BEHAVIORAL SYMPTOMS/MOOD NEUROLOGICAL BOWEL NUTRITION STATUS      Continent Diet: Heart Healthy   AMBULATORY STATUS COMMUNICATION OF NEEDS Skin   Extensive Assist Verbally Surgical wounds(Incision: Left Hip. )                       Personal Care Assistance Level of Assistance  Bathing, Feeding, Dressing Bathing Assistance: Limited assistance Feeding assistance: Independent Dressing Assistance: Limited assistance     Functional Limitations Info  Sight, Hearing, Speech Sight Info: Adequate Hearing Info: Adequate Speech Info: Adequate    SPECIAL CARE FACTORS FREQUENCY  PT (By licensed PT), OT (By licensed OT)     PT Frequency: (5) OT Frequency: (5)            Contractures      Additional Factors Info  Code Status, Allergies Code Status Info: (DNR ) Allergies Info: (Sulfa Antibiotics, Tylenol Acetaminophen)           Current  Medications (08/06/2017):  This is the current hospital active medication list Current Facility-Administered Medications  Medication Dose Route Frequency Provider Last Rate Last Dose  . 0.9 %  sodium chloride infusion   Intravenous Continuous Amelia Jo, MD 75 mL/hr at 08/06/17 0201    . [MAR Hold] acetaminophen (TYLENOL) tablet 650 mg  650 mg Oral Q6H PRN Amelia Jo, MD       Or  . Doug Sou Hold] acetaminophen (TYLENOL) suppository 650 mg  650 mg Rectal Q6H PRN Amelia Jo, MD      . Doug Sou Hold] bisacodyl (DULCOLAX) EC tablet 5 mg  5 mg Oral Daily PRN Amelia Jo, MD      . Doug Sou Hold] carbidopa-levodopa (SINEMET IR) 25-100 MG per tablet immediate release 2 tablet  2 tablet Oral 6 X Daily Amelia Jo, MD   2 tablet at 08/06/17 1137  . ceFAZolin (ANCEF) 2-4 GM/100ML-% IVPB           . [MAR Hold] docusate sodium (COLACE) capsule 100 mg  100 mg Oral BID Amelia Jo, MD   100 mg at 08/06/17 0200  . [MAR Hold] donepezil (ARICEPT) tablet 10 mg  10 mg Oral QPM Amelia Jo, MD      . Doug Sou Hold] entacapone Cassell Clement) tablet 200 mg  200 mg Oral QID Amelia Jo, MD   200 mg at 08/06/17 0920  . fentaNYL (SUBLIMAZE) injection 25 mcg  25 mcg Intravenous Q5  min PRN Molli Barrows, MD      . Doug Sou Hold] HYDROcodone-acetaminophen (NORCO/VICODIN) 5-325 MG per tablet 1-2 tablet  1-2 tablet Oral Q4H PRN Amelia Jo, MD   2 tablet at 08/06/17 0533  . [START ON 08/07/2017] mirabegron ER (MYRBETRIQ) tablet 25 mg  25 mg Oral Daily Gladstone Lighter, MD      . Doug Sou Hold] morphine 2 MG/ML injection 2 mg  2 mg Intravenous Q4H PRN Gladstone Lighter, MD   2 mg at 08/06/17 1136  . [MAR Hold] ondansetron (ZOFRAN) tablet 4 mg  4 mg Oral Q6H PRN Amelia Jo, MD       Or  . Doug Sou Hold] ondansetron [facility]) injection 4 mg  4 mg Intravenous Q6H PRN Amelia Jo, MD      . ondansetron [facility]) injection 4 mg  4 mg Intravenous Once PRN Molli Barrows, MD      . Doug Sou Hold] pantoprazole (PROTONIX) EC tablet 40 mg  40 mg  Oral Daily Amelia Jo, MD      . Doug Sou Hold] traZODone (DESYREL) tablet 25 mg  25 mg Oral QHS PRN Amelia Jo, MD         Discharge Medications: Please see discharge summary for a list of discharge medications.  Relevant Imaging Results:  Relevant Lab Results:   Additional Information (SSN: [id number])  Florina Glas, [place]onia Beets, LCSW

## 2017-08-06 NOTE — Clinical Social Work Note (Signed)
Clinical Social Work Assessment  Patient Details  Name: Lucas Edwards MRN: [id number] Date of Birth: 01-Apr-1950  Date of referral:  08/06/17               Reason for consult:  Facility Placement                Permission sought to share information with:  Chartered certified accountant granted to share information::  Yes, Verbal Permission Granted  Name::      [place]::     Relationship::     Contact Information:     Housing/Transportation Living arrangements for the past 2 months:  [place] of Information:  Patient, Facility Patient Interpreter Needed:  None Criminal Activity/Legal Involvement Pertinent to Current Situation/Hospitalization:  No - Comment as needed Significant Relationships:  Other Family Members Lives with:  Self Do you feel safe going back to the place where you live?  Yes Need for family participation in patient care:  Yes (Comment)  Care giving concerns:  Patient is an independent living resident at [facility].    Social Worker assessment / plan:  Holiday representative ([place]) reviewed chart and noted that patient has a hip fracture. Surgery and PT are pending. CSW met with patient alone at bedside prior to surgery today. Patient was alert and oriented X3 and was laying in the bed. CSW introduced self and explained role of CSW department. Patient reported that he lives alone at [facility] independent living and his niece Lucas Edwards is his HPOA. CSW explained that patient will need to go to [facility] for short term rehab after surgery. Patient is agreeable to going to [facility] for rehab. CSW explained that medicare requires a 3 night qualifying inpatient stay in a hospital in order to pay for SNF. Patient was admitted to inpatient on 08/05/17. Patient verbalized his understanding.  Plan is for patient to D/C to [facility] Monday 08/09/17 pending medical clearance. [facility] admissions coordinator at  [facility] is aware of above. CSW will continue to follow and assist as needed.    Employment status:  Retired Forensic scientist:  Medicare PT Recommendations:  Not assessed at this time [place] / Referral to community resources:  [place]  Patient/Family's Response to care:  Patient is agreeable to D/C to [facility].   Patient/Family's Understanding of and Emotional Response to Diagnosis, Current Treatment, and Prognosis:  Patient was very pleasant and thanked CSW for assistance.   Emotional Assessment Appearance:  Appears stated age Attitude/Demeanor/Rapport:    Affect (typically observed):  Accepting, Adaptable, Pleasant Orientation:  Oriented to Self, Oriented to Place, Oriented to  Time, Oriented to Situation Alcohol / Substance use:  Not Applicable Psych involvement (Current and /or in the community):  No (Comment)  Discharge Needs  Concerns to be addressed:  Discharge Planning Concerns Readmission within the last 30 days:  No Current discharge risk:  Dependent with Mobility Barriers to Discharge:  Continued Medical Work up   [employer], Lucas Beets, LCSW 08/06/2017, 3:50 PM

## 2017-08-07 LAB — CBC WITH DIFFERENTIAL/PLATELET
BASOS ABS: 0 10*3/uL (ref 0–0.1)
BASOS PCT: 1 %
Eosinophils Absolute: 0.1 10*3/uL (ref 0–0.7)
Eosinophils Relative: 1 %
HEMATOCRIT: 41.8 % (ref 40.0–52.0)
Hemoglobin: 14.4 g/dL (ref 13.0–18.0)
Lymphocytes Relative: 7 %
Lymphs Abs: 0.7 10*3/uL — ABNORMAL LOW (ref 1.0–3.6)
MCH: 31.2 pg (ref 26.0–34.0)
MCHC: 34.4 g/dL (ref 32.0–36.0)
MCV: 90.7 fL (ref 80.0–100.0)
MONO ABS: 0.7 10*3/uL (ref 0.2–1.0)
Monocytes Relative: 7 %
NEUTROS ABS: 8 10*3/uL — AB (ref 1.4–6.5)
Neutrophils Relative %: 84 %
PLATELETS: 163 10*3/uL (ref 150–440)
RBC: 4.61 MIL/uL (ref 4.40–5.90)
RDW: 13.6 % (ref 11.5–14.5)
WBC: 9.6 10*3/uL (ref 3.8–10.6)

## 2017-08-07 LAB — BASIC METABOLIC PANEL
ANION GAP: 5 (ref 5–15)
BUN: 9 mg/dL (ref 6–20)
CALCIUM: 8.2 mg/dL — AB (ref 8.9–10.3)
CO2: 27 mmol/L (ref 22–32)
Chloride: 107 mmol/L (ref 101–111)
Creatinine, Ser: 0.75 mg/dL (ref 0.61–1.24)
Glucose, Bld: 115 mg/dL — ABNORMAL HIGH (ref 65–99)
Potassium: 3.8 mmol/L (ref 3.5–5.1)
Sodium: 139 mmol/L (ref 135–145)

## 2017-08-07 LAB — GLUCOSE, CAPILLARY
GLUCOSE-CAPILLARY: 116 mg/dL — AB (ref 65–99)
GLUCOSE-CAPILLARY: 107 mg/dL — AB (ref 65–99)

## 2017-08-07 NOTE — Progress Notes (Signed)
Physical Therapy Treatment Patient Details Name: Lucas Edwards MRN: [id number] DOB: 1951-03-30 Today's Date: 08/07/2017    History of Present Illness Patient is a 67 year old male admitted from [facility] s/p L hip unipolar hemiarthroplasty with a posterior approach.  PMH includes PD, HLD, Htn, GERd, chest pain and abdominal hernia.    PT Comments    Pt able to progress to walking 20 ft in room today with RW, min A for navigation of obstacles and stability.  PT provided education and frequent VC's for maintaining posterior hip precautions and pt follows in consistently.  He presents with less muscle control and movement patterns consistent with the presentation of athetosis.  PT encouraged pt to practice STS repetitively until he is comfortable.  PT issued and reviewed HEP with posterior hip education and pt expressed understanding. He was able to complete sets of 10 of LE there ex with manual cues for appropriate body mechanics.  Pt reported 6/10 pain during mobility.  Pt will continue to benefit from skilled PT with focus on strength, balance and fall risk, hip precautions, safe use of RW and functional mobility.  Follow Up Recommendations  SNF     Equipment Recommendations  (TBD at next venue of care.)    Recommendations for Other Services       Precautions / Restrictions Precautions Precautions: Fall;Posterior Hip Precaution Booklet Issued: Yes (comment) Precaution Comments: Fall related to current medical status Restrictions Weight Bearing Restrictions: Yes LLE Weight Bearing: Weight bearing as tolerated    Mobility  Bed Mobility Overal bed mobility: Needs Assistance Bed Mobility: Sit to Supine       Sit to supine: Min assist   General bed mobility comments: Assisted with positioning of LE's and education concerning managing posterior hip precautions.  Transfers Overall transfer level: Needs assistance Equipment used: Rolling walker (2 wheeled) Transfers: Sit  to/from Stand Sit to Stand: Min assist         General transfer comment: Requires manual and VC's for management of post. hip precautions.  Able to demonstrate appropriate body mechanics for STS inconsistently.  Ambulation/Gait Ambulation/Gait assistance: Min assist Ambulation Distance (Feet): 20 Feet Assistive device: Rolling walker (2 wheeled)     Gait velocity interpretation: <1.8 ft/sec, indicate of risk for recurrent falls General Gait Details: pt willing to walk with RW, requires mod-max VC's for management of RW, sequencing for safe turns and obstacle clearance.  Pt uncomfortable lateral or retro stepping and performs a complete 360 degree turn to avoid.   Stairs             Wheelchair Mobility    Modified Rankin (Stroke Patients Only)       Balance Overall balance assessment: Needs assistance Sitting-balance support: Bilateral upper extremity supported;Feet supported       Standing balance support: Bilateral upper extremity supported                 High level balance activites: Side stepping;Backward walking;Direction changes High Level Balance Comments: Slowed gait and min A to maintain balance            Cognition Arousal/Alertness: Awake/alert Behavior During Therapy: WFL for tasks assessed/performed Overall Cognitive Status: Within Functional Limits for tasks assessed                                 General Comments: Pt attention is inconsistent and requires redirection. Has had Levadopa dose 2 hrs  prior to treatment and presents with athetosis-like movement of neck and UE's.      Exercises General Exercises - Lower Extremity Ankle Circles/Pumps: 20 reps;AROM;Both;Supine Quad Sets: Strengthening;Left;10 reps;Supine Short Arc Quad: Strengthening;Left;5 reps;Supine Heel Slides: 10 reps;AAROM;Supine;Left Hip ABduction/ADduction: AAROM;10 reps;Supine;Left Other Exercises Other Exercises: Education regarding posterior hip  precautions.  Handout given to pt.    General Comments        Pertinent Vitals/Pain      Home Living                      Prior Function            PT Goals (current goals can now be found in the care plan section) Acute Rehab PT Goals Patient Stated Goal: To return to regular activity and exercise program. PT Goal Formulation: With patient Time For Goal Achievement: 08/21/17 Potential to Achieve Goals: Good Progress towards PT goals: Progressing toward goals    Frequency    BID      PT Plan Current plan remains appropriate    Co-evaluation              AM-PAC PT "6 Clicks" Daily Activity  Outcome Measure  Difficulty turning over in bed (including adjusting bedclothes, sheets and blankets)?: A Little Difficulty moving from lying on back to sitting on the side of the bed? : A Lot Difficulty sitting down on and standing up from a chair with arms (e.g., wheelchair, bedside commode, etc,.)?: A Little Help needed moving to and from a bed to chair (including a wheelchair)?: A Little Help needed walking in hospital room?: A Little Help needed climbing 3-5 steps with a railing? : A Little 6 Click Score: 17    End of Session Equipment Utilized During Treatment: Gait belt Activity Tolerance: Patient tolerated treatment well Patient left: with call bell/phone within reach;in bed;with bed alarm set Nurse Communication: Mobility status;Precautions PT Visit Diagnosis: Unsteadiness on feet (R26.81);Muscle weakness (generalized) (M62.81);Pain Pain - Right/Left: Left Pain - part of body: Hip     Time: 1320-1350 PT Time Calculation (min) (ACUTE ONLY): 30 min  Charges:  $Therapeutic Exercise: 8-22 mins $Therapeutic Activity: 8-22 mins                    G Codes:  Functional Assessment Tool Used: AM-PAC 6 Clicks Basic Mobility    Roxanne Gates, PT, DPT    Roxanne Gates 08/07/2017, 2:14 PM

## 2017-08-07 NOTE — Progress Notes (Signed)
  Subjective: 1 Day Post-Op Procedure(s) (LRB): ARTHROPLASTY BIPOLAR HIP (HEMIARTHROPLASTY) (Left) Patient reports pain as mild.   Patient is well, and has had no acute complaints or problems PT and care management to assist with discharge planning, Pt was living at [facility] prior to fall. Negative for chest pain and shortness of breath Fever: no Gastrointestinal:Negative for nausea and vomiting  Objective: Vital signs in last 24 hours: Temp:  [97.1 F (36.2 C)-99 F (37.2 C)] 97.8 F (36.6 C) (05/11 0815) Pulse Rate:  [66-98] 90 (05/11 0815) Resp:  [11-20] 18 (05/11 0508) BP: (113-154)/(70-91) 154/91 (05/11 0815) SpO2:  [93 %-99 %] 97 % (05/11 0815)  Intake/Output from previous day:  Intake/Output Summary (Last 24 hours) at 08/07/2017 0936 Last data filed at 08/07/2017 0211 Gross per 24 hour  Intake 2121.25 ml  Output 2800 ml  Net -678.75 ml    Intake/Output this shift: No intake/output data recorded.  Labs: Recent Labs    08/05/17 2016 08/06/17 0354 08/07/17 0515  HGB 15.6 15.7 14.4   Recent Labs    08/06/17 0354 08/07/17 0515  WBC 11.4* 9.6  RBC 5.12 4.61  HCT 46.0 41.8  PLT 177 163   Recent Labs    08/06/17 0354 08/07/17 0515  NA 138 139  K 3.6 3.8  CL 105 107  CO2 28 27  BUN 15 9  CREATININE 0.87 0.75  GLUCOSE 119* 115*  CALCIUM 8.4* 8.2*   Recent Labs    08/05/17 2016  INR 0.91     EXAM General - Patient is Alert, but is slightly lacking this morning when answering questions. Extremity - ABD soft Neurovascular intact Sensation intact distally Intact pulses distally Dorsiflexion/Plantar flexion intact Incision: dressing C/D/I No cellulitis present Dressing/Incision - clean, dry, no drainage Motor Function - intact, moving foot and toes well on exam.   Past Medical History:  Diagnosis Date  . Abdominal hernia   . Chest pain    a. 2001 Cath:  nl cors per pt.  Marland Kitchen GERD (gastroesophageal reflux disease)   . History of hypertension    . Hyperlipidemia   . Parkinson disease ([place])     Assessment/Plan: 1 Day Post-Op Procedure(s) (LRB): ARTHROPLASTY BIPOLAR HIP (HEMIARTHROPLASTY) (Left) Active Problems:   Hip fx (H[facility])  Estimated body mass index is 24.37 kg/m as calculated from the following:   Height as of this encounter: 5\' 9"  (1.753 m).   Weight as of this encounter: 74.8 kg (165 lb). Advance diet Up with therapy D/C IV fluids when tolerating po intake.  Labs reviewed this AM. Up with therapy. Begin working on a BM. CBC and BMP ordered for tomorrow morning.  DVT Prophylaxis - Lovenox, Foot Pumps and TED hose Weight-Bearing as tolerated to left leg  J. Cameron Proud, PA-C [facility] Orthopaedic Surgery 08/07/2017, 9:36 AM

## 2017-08-07 NOTE — Evaluation (Signed)
Physical Therapy Evaluation Patient Details Name: Lucas Edwards MRN: [id number] DOB: 12-31-1950 Today's Date: 08/07/2017   History of Present Illness  patient is a 67 year old male admitted from [facility] s/p L hip unipolar hemiarthroplasty with a posterior approach.  PMH includes PD, HLD, Htn, GERd, chest pain and abdominal hernia.  Clinical Impression  Pt is a 67 year old male who lives in an independent living home at [facility].  He is generally active and walks daily without an AD at baseline.  Pt in bed upon PT arrival and reported 6/10 pain in L hip.  Pt presented with generalized weakness of UE and WNL strength of R LE.  L LE dressing intact on posterior hip and pt apprehensive regarding PT touching L LE due to pain.  He required mod A to get to EOB and pt was able to balance at EOB without assistance PT reviewed posterior hip precautions and assisted pt in standing from EOB and educated in use of RW.  Pt able to walk 4 ft to recliner with min A for management of RW.  Pt presented with gait deviations and difficulty in initiating gait consistent with sx of PD.  He will continue to benefit from skilled PT with focus on strength, mobility, safe use of RW, pain management and return to tolerance of regular exercise program.    Follow Up Recommendations SNF    Equipment Recommendations  (TBD at next venue of care.)    Recommendations for Other Services       Precautions / Restrictions Precautions Precautions: Fall;Posterior Hip Precaution Booklet Issued: No(Will issue during afternoon treatment.) Precaution Comments: Fall related to current medical status Restrictions Weight Bearing Restrictions: Yes LLE Weight Bearing: Weight bearing as tolerated      Mobility  Bed Mobility Overal bed mobility: Needs Assistance Bed Mobility: Supine to Sit     Supine to sit: Mod assist     General bed mobility comments: hand held assist to complete upright posture and to scoot to EOB.   Pt hesistant with movement and requires reminder to maintain hip precautions.  Transfers Overall transfer level: Needs assistance Equipment used: Rolling walker (2 wheeled) Transfers: Sit to/from Stand Sit to Stand: Min assist         General transfer comment: Requires assistance to initiate STS and education concerning body mechanics to maintain posterior hip precautions.  Pt states that he is used to flexing at the hips frequently and that it will take time to adjust to this type of body mechanics.  Ambulation/Gait Ambulation/Gait assistance: Min assist Ambulation Distance (Feet): 4 Feet Assistive device: Rolling walker (2 wheeled)       General Gait Details: Low foot clearance, slight delay in initiation of gait (consistent with PD), decreased step length and need for heavy VC's for management of RW and use of UE to control descent to chair once in place.    Stairs            Wheelchair Mobility    Modified Rankin (Stroke Patients Only)       Balance Overall balance assessment: Modified Independent                                           Pertinent Vitals/Pain Pain Assessment: 0-10 Pain Score: 6  Pain Location: L hip Pain Descriptors / Indicators: Aching Pain Intervention(s): Limited activity within  patient's tolerance;Monitored during session    Home Living Family/patient expects to be discharged to:: Skilled nursing facility                      Prior Function Level of Independence: Independent         Comments: Pt participates in exercise daily, including boxing for PD, and walks 1 mi. 3x/week.  Not familiar with use of AD.     Hand Dominance        Extremity/Trunk Assessment   Upper Extremity Assessment Upper Extremity Assessment: Generalized weakness    Lower Extremity Assessment Lower Extremity Assessment: Overall WFL for tasks assessed;LLE deficits/detail LLE: Unable to fully assess due to pain     Cervical / Trunk Assessment Cervical / Trunk Assessment: Kyphotic(Slilghtly kyphotic)  Communication   Communication: No difficulties  Cognition Arousal/Alertness: Awake/alert Behavior During Therapy: WFL for tasks assessed/performed Overall Cognitive Status: Within Functional Limits for tasks assessed                                 General Comments: A&O to self, situation, date.  Follows directions consistently with increased time needed.      General Comments      Exercises Other Exercises Other Exercises: Educated concerning posterio hip precautions. Other Exercises: Educated concerning use of RW as pt does not use an AD for mobility.   Assessment/Plan    PT Assessment Patient needs continued PT services  PT Problem List Decreased strength;Decreased mobility;Decreased balance;Decreased knowledge of use of DME;Pain;Decreased activity tolerance;Decreased range of motion       PT Treatment Interventions DME instruction;Therapeutic activities;Gait training;Patient/family education;Therapeutic exercise;Stair training;Balance training;Functional mobility training;Neuromuscular re-education    PT Goals (Current goals can be found in the Care Plan section)  Acute Rehab PT Goals Patient Stated Goal: To return to regular activity and exercise program. PT Goal Formulation: With patient Time For Goal Achievement: 08/21/17 Potential to Achieve Goals: Good    Frequency BID   Barriers to discharge        Co-evaluation               AM-PAC PT "6 Clicks" Daily Activity  Outcome Measure Difficulty turning over in bed (including adjusting bedclothes, sheets and blankets)?: A Little Difficulty moving from lying on back to sitting on the side of the bed? : A Lot Difficulty sitting down on and standing up from a chair with arms (e.g., wheelchair, bedside commode, etc,.)?: A Little Help needed moving to and from a bed to chair (including a wheelchair)?: A  Little Help needed walking in hospital room?: A Little Help needed climbing 3-5 steps with a railing? : A Little 6 Click Score: 17    End of Session Equipment Utilized During Treatment: Gait belt   Patient left: in chair;with call bell/phone within reach;with chair alarm set Nurse Communication: Mobility status PT Visit Diagnosis: Unsteadiness on feet (R26.81);Muscle weakness (generalized) (M62.81);Pain Pain - Right/Left: Left Pain - part of body: Hip    Time: [id number] PT Time Calculation (min) (ACUTE ONLY): 30 min   Charges:   PT Evaluation $PT Eval Low Complexity: 1 Low PT Treatments $Therapeutic Activity: 8-22 mins   PT G Codes:   PT G-Codes **NOT FOR INPATIENT CLASS** Functional Assessment Tool Used: AM-PAC 6 Clicks Basic Mobility    Roxanne Gates, PT, DPT  Roxanne Gates 08/07/2017, 9:40 AM

## 2017-08-08 LAB — CBC
HCT: 41.5 % (ref 40.0–52.0)
Hemoglobin: 14.2 g/dL (ref 13.0–18.0)
MCH: 31.3 pg (ref 26.0–34.0)
MCHC: 34.3 g/dL (ref 32.0–36.0)
MCV: 91.2 fL (ref 80.0–100.0)
PLATELETS: 162 10*3/uL (ref 150–440)
RBC: 4.54 MIL/uL (ref 4.40–5.90)
RDW: 13.9 % (ref 11.5–14.5)
WBC: 7.7 10*3/uL (ref 3.8–10.6)

## 2017-08-08 LAB — BASIC METABOLIC PANEL
Anion gap: 5 (ref 5–15)
BUN: 11 mg/dL (ref 6–20)
CALCIUM: 8.3 mg/dL — AB (ref 8.9–10.3)
CO2: 28 mmol/L (ref 22–32)
CREATININE: 0.83 mg/dL (ref 0.61–1.24)
Chloride: 105 mmol/L (ref 101–111)
GFR calc Af Amer: >60 mL/min (ref >60)
Glucose, Bld: 112 mg/dL — ABNORMAL HIGH (ref 65–99)
POTASSIUM: 3.6 mmol/L (ref 3.5–5.1)
SODIUM: 138 mmol/L (ref 135–145)

## 2017-08-08 LAB — GLUCOSE, CAPILLARY: Glucose-Capillary: 112 mg/dL — ABNORMAL HIGH (ref 65–99)

## 2017-08-08 LAB — TROPONIN I

## 2017-08-08 MED ORDER — CARBIDOPA-LEVODOPA 25-100 MG PO TABS
2.0000 | ORAL_TABLET | ORAL | Status: DC
  Administered 2017-08-08: 2 via ORAL
  Filled 2017-08-08 (×3): qty 2

## 2017-08-08 MED ORDER — CARBIDOPA-LEVODOPA 25-100 MG PO TABS
2.0000 | ORAL_TABLET | ORAL | Status: DC
  Administered 2017-08-08 – 2017-08-10 (×9): 2 via ORAL
  Filled 2017-08-08 (×14): qty 2

## 2017-08-08 MED ORDER — CARBIDOPA-LEVODOPA 25-100 MG PO TABS
3.0000 | ORAL_TABLET | Freq: Every day | ORAL | Status: DC
  Administered 2017-08-08 – 2017-08-09 (×2): 3 via ORAL
  Filled 2017-08-08 (×3): qty 3

## 2017-08-08 MED ORDER — OXYCODONE HCL 5 MG PO TABS
5.0000 mg | ORAL_TABLET | Freq: Two times a day (BID) | ORAL | Status: DC
  Administered 2017-08-08 – 2017-08-10 (×4): 5 mg via ORAL
  Filled 2017-08-08 (×4): qty 1

## 2017-08-08 NOTE — Progress Notes (Signed)
[facility] [place] NAME: Lucas Edwards    MR#:  [id number]  DATE OF BIRTH:  [birth year]  SUBJECTIVE:  CHIEF COMPLAINT:   Chief Complaint  Patient presents with  . Fall   -Patient is from independent living facility, had a mechanical fall as he lost his balance coming down stairs.  Has a left hip femoral neck fracture. -Complains of pain.  S/p surgery 08/06/17  REVIEW OF SYSTEMS:  Review of Systems  Constitutional: Negative for chills, fever and malaise/fatigue.  HENT: Positive for hearing loss. Negative for congestion, ear discharge and nosebleeds.   Eyes: Negative for blurred vision and double vision.  Respiratory: Negative for cough, shortness of breath and wheezing.   Cardiovascular: Negative for chest pain and palpitations.  Gastrointestinal: Negative for abdominal pain, constipation, diarrhea, nausea and vomiting.  Genitourinary: Negative for dysuria.  Musculoskeletal: Positive for joint pain and myalgias.  Neurological: Negative for dizziness, focal weakness, seizures, weakness and headaches.  Psychiatric/Behavioral: Negative for depression.    DRUG ALLERGIES:   Allergies  Allergen Reactions  . Sulfa Antibiotics Hives  . Tylenol [Acetaminophen] Itching    VITALS:  Blood pressure 129/79, pulse 82, temperature 98.7 F (37.1 C), temperature source Oral, resp. rate 18, height 5\' 9"  (1.753 m), weight 68.9 kg (152 lb), SpO2 97 %.  PHYSICAL EXAMINATION:  Physical Exam  GENERAL:  67 y.o.-year-old patient lying in the bed with no acute distress.  EYES: Pupils equal, round, reactive to light and accommodation. No scleral icterus. Extraocular muscles intact.  HEENT: Head atraumatic, normocephalic. Oropharynx and nasopharynx clear.  NECK:  Supple, no jugular venous distention. No thyroid enlargement, no tenderness.  LUNGS: Normal breath sounds bilaterally, no wheezing, rales,rhonchi or crepitation. No use of accessory muscles  of respiration. Decreased bibasilar breath sounds CARDIOVASCULAR: S1, S2 normal. No  rubs, or gallops. 3/6 systolic murmur presnt ABDOMEN: Soft, nontender, nondistended. Bowel sounds present. No organomegaly or mass.  EXTREMITIES: No pedal edema, cyanosis, or clubbing.  Left hip pain. NEUROLOGIC: Cranial nerves II through XII are intact. Muscle strength 4/5 in all extremities except left lower extremity due to pain. Sensation intact. Gait not checked.  PSYCHIATRIC: The patient is alert and oriented x 2.  SKIN: No obvious rash, lesion, or ulcer.    LABORATORY PANEL:   CBC Recent Labs  Lab 08/08/17 0523  WBC 7.7  HGB 14.2  HCT 41.5  PLT 162   ------------------------------------------------------------------------------------------------------------------  Chemistries  Recent Labs  Lab 08/08/17 0523  NA 138  K 3.6  CL 105  CO2 28  GLUCOSE 112*  BUN 11  CREATININE 0.83  CALCIUM 8.3*   ------------------------------------------------------------------------------------------------------------------  Cardiac Enzymes Recent Labs  Lab 08/08/17 1232  TROPONINI <0.03   ------------------------------------------------------------------------------------------------------------------  RADIOLOGY:  No results found.  EKG:   Orders placed or performed during the hospital encounter of 08/05/17  . EKG 12-Lead  . EKG 12-Lead    ASSESSMENT AND PLAN:   67 year old male with past medical history significant for hypertension, early dementia, Parkinson's disease who is at an independent living facility was brought in secondary to a mechanical fall  1.  Left femoral neck fracture-secondary to mechanical fall - Appreciate orthopedic consult - Pain control, physical therapy consult after surgery- encouraged to ask for pain meds, so he can do PT in day time. - s/p left hemiarthroplasty, start PT.  - may need SNF.  2.  Hypertension-blood pressure is stable.  On norvasc.   Monitor  3.  Dementia  and Parkinson's-continue outpatient follow-up.  Patient on Aricept, entacapone and sinemet  4. GERD- protonix  5. DVT Prophylaxis- will be started after surgery   All the records are reviewed and case discussed with Care Management/Social Workerr. Management plans discussed with the patient, family and they are in agreement.  CODE STATUS: DNR  TOTAL TIME TAKING CARE OF THIS PATIENT: 38 minutes.   POSSIBLE D/C IN 2-3 DAYS, DEPENDING ON CLINICAL CONDITION.   Vaughan Basta M.D on 08/08/2017 at 4:00 PM  Between 7am to 6pm - Pager - [phone]  After 6pm go to [email or web address] - password [email or web address] [facility]  Office  [phone]  CC: Primary care physician; Dion Body, MD

## 2017-08-08 NOTE — Anesthesia Postprocedure Evaluation (Signed)
Anesthesia Post Note  Patient: Lucas Edwards  Procedure(s) Performed: ARTHROPLASTY BIPOLAR HIP (HEMIARTHROPLASTY) (Left Hip)  Patient location during evaluation: PACU Anesthesia Type: General Level of consciousness: awake and alert Pain management: pain level controlled Vital Signs Assessment: post-procedure vital signs reviewed and stable Respiratory status: spontaneous breathing, nonlabored ventilation, respiratory function stable and patient connected to nasal cannula oxygen Cardiovascular status: blood pressure returned to baseline and stable Postop Assessment: no apparent nausea or vomiting Anesthetic complications: no     Last Vitals:  Vitals:   08/07/17 1628 08/07/17 2319  BP: (!) 147/87 133/83  Pulse: 91 80  Resp: 18 19  Temp: 36.7 C 36.8 C  SpO2: 100% 96%    Last Pain:  Vitals:   08/07/17 2319  TempSrc: Oral  PainSc:                  Martha Clan

## 2017-08-08 NOTE — Progress Notes (Signed)
Physical Therapy Treatment Patient Details Name: Lucas Edwards MRN: [id number] DOB: 20-Sep-1950 Today's Date: 08/08/2017    History of Present Illness Patient is a 67 year old male admitted from [facility] s/p L hip unipolar hemiarthroplasty with a posterior approach.  PMH includes PD, HLD, Htn, GERd, chest pain and abdominal hernia.    PT Comments    Pt in bed, ready to get up for breakfast.  Participated in exercises as described below.  To edge of bed with mod a x 1.  Sitting with min guard and generally unsafe to be left unattended but needs no help with balance statically.  Stood with min a x 1 and was able to ambulate 50' with walker and min a x 1 with short shuffling steps.  Encouraged to advance LLE first but at times he advances RLE first "My Parkinsons tells me which foot goes first".  Pt with decreased step height and length bilaterally.  Balance deficits are evident but overall pt does very well today.   No unusual movement patterns today or athetosis as seen yesterday during gait.  Pt had not had his Parkinsons medications prior to session today.     Follow Up Recommendations  SNF     Equipment Recommendations       Recommendations for Other Services       Precautions / Restrictions Precautions Precautions: Fall;Posterior Hip Precaution Booklet Issued: Yes (comment) Precaution Comments: Fall related to current medical status Restrictions Weight Bearing Restrictions: Yes LLE Weight Bearing: Weight bearing as tolerated    Mobility  Bed Mobility Overal bed mobility: Needs Assistance Bed Mobility: Sit to Supine     Supine to sit: Mod assist     General bed mobility comments: Assisted with positioning of LE's and education concerning managing posterior hip precautions.  Transfers Overall transfer level: Needs assistance Equipment used: Rolling walker (2 wheeled) Transfers: Sit to/from Stand Sit to Stand: Min assist             Ambulation/Gait Ambulation/Gait assistance: Min assist Ambulation Distance (Feet): 50 Feet Assistive device: Rolling walker (2 wheeled) Gait Pattern/deviations: Step-through pattern;Shuffle;Decreased step length - right;Decreased step length - left   Gait velocity interpretation: <1.8 ft/sec, indicate of risk for recurrent falls     Stairs             Wheelchair Mobility    Modified Rankin (Stroke Patients Only)       Balance Overall balance assessment: Needs assistance Sitting-balance support: Bilateral upper extremity supported;Feet supported Sitting balance-Leahy Scale: Fair Sitting balance - Comments: unsafe to be left sitting unattended   Standing balance support: Bilateral upper extremity supported Standing balance-Leahy Scale: Poor                              Cognition Arousal/Alertness: Awake/alert Behavior During Therapy: WFL for tasks assessed/performed Overall Cognitive Status: Within Functional Limits for tasks assessed                                        Exercises General Exercises - Lower Extremity Ankle Circles/Pumps: 20 reps;AROM;Both;Supine Heel Slides: 10 reps;AAROM;Supine;Left Hip ABduction/ADduction: AAROM;10 reps;Supine;Left Straight Leg Raises: 10 reps;AAROM;Supine;Left Other Exercises Other Exercises: precaution review    General Comments        Pertinent Vitals/Pain Pain Assessment: Faces Faces Pain Scale: Hurts little more Pain Location: L hip  with movement Pain Descriptors / Indicators: Aching;Operative site guarding Pain Intervention(s): Limited activity within patient's tolerance;Monitored during session    Home Living                      Prior Function            PT Goals (current goals can now be found in the care plan section) Progress towards PT goals: Progressing toward goals    Frequency    BID      PT Plan Current plan remains appropriate    Co-evaluation               AM-PAC PT "6 Clicks" Daily Activity  Outcome Measure  Difficulty turning over in bed (including adjusting bedclothes, sheets and blankets)?: A Little Difficulty moving from lying on back to sitting on the side of the bed? : Unable Difficulty sitting down on and standing up from a chair with arms (e.g., wheelchair, bedside commode, etc,.)?: Unable Help needed moving to and from a bed to chair (including a wheelchair)?: A Little Help needed walking in hospital room?: A Little Help needed climbing 3-5 steps with a railing? : A Lot 6 Click Score: 13    End of Session Equipment Utilized During Treatment: Gait belt Activity Tolerance: Patient tolerated treatment well Patient left: with call bell/phone within reach;in chair;with chair alarm set   Pain - Right/Left: Left Pain - part of body: Hip     Time: 7628-3151 PT Time Calculation (min) (ACUTE ONLY): 25 min  Charges:  $Gait Training: 8-22 mins $Therapeutic Exercise: 8-22 mins                    G Codes:       Chesley Noon, PTA 08/08/17, 9:50 AM

## 2017-08-08 NOTE — Progress Notes (Signed)
[facility] [place] NAME: Lucas Edwards    MR#:  [id number]  DATE OF BIRTH:  Aug 10, 1950  SUBJECTIVE:  CHIEF COMPLAINT:   Chief Complaint  Patient presents with  . Fall   -Patient is from independent living facility, had a mechanical fall as he lost his balance coming down stairs.  Has a left hip femoral neck fracture. -Complains of pain.  S/p surgery 08/06/17  REVIEW OF SYSTEMS:  Review of Systems  Constitutional: Negative for chills, fever and malaise/fatigue.  HENT: Positive for hearing loss. Negative for congestion, ear discharge and nosebleeds.   Eyes: Negative for blurred vision and double vision.  Respiratory: Negative for cough, shortness of breath and wheezing.   Cardiovascular: Negative for chest pain and palpitations.  Gastrointestinal: Negative for abdominal pain, constipation, diarrhea, nausea and vomiting.  Genitourinary: Negative for dysuria.  Musculoskeletal: Positive for joint pain and myalgias.  Neurological: Negative for dizziness, focal weakness, seizures, weakness and headaches.  Psychiatric/Behavioral: Negative for depression.    DRUG ALLERGIES:   Allergies  Allergen Reactions  . Sulfa Antibiotics Hives  . Tylenol [Acetaminophen] Itching    VITALS:  Blood pressure 133/83, pulse 80, temperature 98.3 F (36.8 C), temperature source Oral, resp. rate 19, height 5\' 9"  (1.753 m), weight 68.9 kg (152 lb), SpO2 96 %.  PHYSICAL EXAMINATION:  Physical Exam  GENERAL:  67 y.o.-year-old patient lying in the bed with no acute distress.  EYES: Pupils equal, round, reactive to light and accommodation. No scleral icterus. Extraocular muscles intact.  HEENT: Head atraumatic, normocephalic. Oropharynx and nasopharynx clear.  NECK:  Supple, no jugular venous distention. No thyroid enlargement, no tenderness.  LUNGS: Normal breath sounds bilaterally, no wheezing, rales,rhonchi or crepitation. No use of accessory muscles  of respiration. Decreased bibasilar breath sounds CARDIOVASCULAR: S1, S2 normal. No  rubs, or gallops. 3/6 systolic murmur presnt ABDOMEN: Soft, nontender, nondistended. Bowel sounds present. No organomegaly or mass.  EXTREMITIES: No pedal edema, cyanosis, or clubbing.  Left hip pain. NEUROLOGIC: Cranial nerves II through XII are intact. Muscle strength 5/5 in all extremities except left lower extremity due to pain. Sensation intact. Gait not checked.  PSYCHIATRIC: The patient is alert and oriented x 2.  SKIN: No obvious rash, lesion, or ulcer.    LABORATORY PANEL:   CBC Recent Labs  Lab 08/08/17 0523  WBC 7.7  HGB 14.2  HCT 41.5  PLT 162   ------------------------------------------------------------------------------------------------------------------  Chemistries  Recent Labs  Lab 08/08/17 0523  NA 138  K 3.6  CL 105  CO2 28  GLUCOSE 112*  BUN 11  CREATININE 0.83  CALCIUM 8.3*   ------------------------------------------------------------------------------------------------------------------  Cardiac Enzymes No results for input(s): TROPONINI in the last 168 hours. ------------------------------------------------------------------------------------------------------------------  RADIOLOGY:  Dg Hip Unilat W Or W/o Pelvis 2-3 Views Left  Result Date: 08/06/2017 CLINICAL DATA:  Post LEFT hip replacement EXAM: DG HIP (WITH OR WITHOUT PELVIS) 2-3V LEFT COMPARISON:  Preoperative exam of 08/05/2017 FINDINGS: Bones demineralized. Resection of LEFT femoral head/neck and placement of a LEFT hip prosthesis. No acute fracture, dislocation or bone destruction. Question prior LEFT inguinal herniorrhaphy. RIGHT hip joint space preserved. IMPRESSION: LEFT hip prosthesis without acute complication. Electronically Signed   By: Lavonia Dana M.D.   On: 08/06/2017 16:54    EKG:   Orders placed or performed during the hospital encounter of 08/05/17  . EKG 12-Lead  . EKG 12-Lead     ASSESSMENT AND PLAN:   67 year old male with past  medical history significant for hypertension, early dementia, Parkinson's disease who is at an independent living facility was brought in secondary to a mechanical fall  1.  Left femoral neck fracture-secondary to mechanical fall - Appreciate orthopedic consult - Pain control, physical therapy consult after surgery - s/p left hemiarthroplasty, start PT.  2.  Hypertension-blood pressure is stable.  On norvasc.  Monitor  3.  Dementia and Parkinson's-continue outpatient follow-up.  Patient on Aricept, entacapone and sinemet  4. GERD- protonix  5. DVT Prophylaxis- will be started after surgery   All the records are reviewed and case discussed with Care Management/Social Workerr. Management plans discussed with the patient, family and they are in agreement.  CODE STATUS: DNR  TOTAL TIME TAKING CARE OF THIS PATIENT: 38 minutes.   POSSIBLE D/C IN 2-3 DAYS, DEPENDING ON CLINICAL CONDITION.   Vaughan Basta M.D on 08/08/2017 at 8:54 AM  Between 7am to 6pm - Pager - [phone]  After 6pm go to [email or web address] - password [email or web address] [facility]  Office  [phone]  CC: Primary care physician; Dion Body, MD

## 2017-08-08 NOTE — Progress Notes (Signed)
While RN was in room assessing pt, pt reported sudden SOB, denied chest pain. O2 saturation 100%on RA, lungs are clear. VSS. Pt had just taken morning medication and had worked with PT in the last hour. Dr. Anselm Jungling notified, no orders received; stated he would see pt in rounds.   [place], Jerry Caras

## 2017-08-08 NOTE — Progress Notes (Signed)
  Subjective: 2 Days Post-Op Procedure(s) (LRB): ARTHROPLASTY BIPOLAR HIP (HEMIARTHROPLASTY) (Left) Patient reports pain as mild.   Patient is well, and has had no acute complaints or problems Plan is for discharge to SNF when medically appropriate. Negative for chest pain and shortness of breath Fever: no Gastrointestinal:Negative for nausea and vomiting  Objective: Vital signs in last 24 hours: Temp:  [98 F (36.7 C)-98.3 F (36.8 C)] 98.3 F (36.8 C) (05/12 0957) Pulse Rate:  [76-97] 97 (05/12 0957) Resp:  [18-19] 18 (05/12 0957) BP: (105-147)/(63-87) 134/83 (05/12 0957) SpO2:  [96 %-100 %] 99 % (05/12 0957) Weight:  [68.9 kg (152 lb)] 68.9 kg (152 lb) (05/12 0433)  Intake/Output from previous day:  Intake/Output Summary (Last 24 hours) at 08/08/2017 1001 Last data filed at 08/08/2017 0900 Gross per 24 hour  Intake 1080 ml  Output 2850 ml  Net -1770 ml    Intake/Output this shift: Total I/O In: 240 [P.O.:240] Out: 900 [Urine:900]  Labs: Recent Labs    08/05/17 2016 08/06/17 0354 08/07/17 0515 08/08/17 0523  HGB 15.6 15.7 14.4 14.2   Recent Labs    08/07/17 0515 08/08/17 0523  WBC 9.6 7.7  RBC 4.61 4.54  HCT 41.8 41.5  PLT 163 162   Recent Labs    08/07/17 0515 08/08/17 0523  NA 139 138  K 3.8 3.6  CL 107 105  CO2 27 28  BUN 9 11  CREATININE 0.75 0.83  GLUCOSE 115* 112*  CALCIUM 8.2* 8.3*   Recent Labs    08/05/17 2016  INR 0.91     EXAM General - Patient is Alert, Appropriate and Oriented Extremity - ABD soft Neurovascular intact Sensation intact distally Intact pulses distally Dorsiflexion/Plantar flexion intact Incision: dressing C/D/I No cellulitis present Dressing/Incision - clean, dry, no drainage Motor Function - intact, moving foot and toes well on exam.   Past Medical History:  Diagnosis Date  . Abdominal hernia   . Chest pain    a. 2001 Cath:  nl cors per pt.  Marland Kitchen GERD (gastroesophageal reflux disease)   . History of  hypertension   . Hyperlipidemia   . Parkinson disease ([place])     Assessment/Plan: 2 Days Post-Op Procedure(s) (LRB): ARTHROPLASTY BIPOLAR HIP (HEMIARTHROPLASTY) (Left) Active Problems:   Hip fx (HCC)  Estimated body mass index is 22.45 kg/m as calculated from the following:   Height as of this encounter: 5\' 9"  (1.753 m).   Weight as of this encounter: 68.9 kg (152 lb). Advance diet Up with therapy D/C IV fluids when tolerating po intake.  Labs reviewed this AM. Up with therapy.  Was able to ambulate 20 feet yesterday.  Currently recommending SNF placement. Begin working on a BM. CBC and BMP ordered for tomorrow morning.  DVT Prophylaxis - Lovenox, Foot Pumps and TED hose Weight-Bearing as tolerated to left leg  J. Cameron Proud, PA-C [facility] 08/08/2017, 10:01 AM

## 2017-08-09 ENCOUNTER — Inpatient Hospital Stay: Payer: Medicare Other

## 2017-08-09 ENCOUNTER — Encounter: Payer: Self-pay | Admitting: Surgery

## 2017-08-09 DIAGNOSIS — Z8781 Personal history of (healed) traumatic fracture: Secondary | ICD-10-CM | POA: Insufficient documentation

## 2017-08-09 LAB — BASIC METABOLIC PANEL
ANION GAP: 6 (ref 5–15)
BUN: 13 mg/dL (ref 6–20)
CO2: 30 mmol/L (ref 22–32)
Calcium: 8.6 mg/dL — ABNORMAL LOW (ref 8.9–10.3)
Chloride: 102 mmol/L (ref 101–111)
Creatinine, Ser: 0.81 mg/dL (ref 0.61–1.24)
GFR calc Af Amer: >60 mL/min (ref >60)
GFR calc non Af Amer: >60 mL/min (ref >60)
GLUCOSE: 110 mg/dL — AB (ref 65–99)
POTASSIUM: 3.9 mmol/L (ref 3.5–5.1)
SODIUM: 138 mmol/L (ref 135–145)

## 2017-08-09 LAB — URINALYSIS, COMPLETE (UACMP) WITH MICROSCOPIC
BACTERIA UA: NONE SEEN
BILIRUBIN URINE: NEGATIVE
Glucose, UA: NEGATIVE mg/dL
Hgb urine dipstick: NEGATIVE
Ketones, ur: 5 mg/dL — AB
Leukocytes, UA: NEGATIVE
Nitrite: NEGATIVE
PROTEIN: NEGATIVE mg/dL
SQUAMOUS EPITHELIAL / LPF: NONE SEEN (ref 0–5)
Specific Gravity, Urine: 1.013 (ref 1.005–1.030)
pH: 5 (ref 5.0–8.0)

## 2017-08-09 LAB — CBC
HEMATOCRIT: 40.2 % (ref 40.0–52.0)
Hemoglobin: 14.1 g/dL (ref 13.0–18.0)
MCH: 31.9 pg (ref 26.0–34.0)
MCHC: 35 g/dL (ref 32.0–36.0)
MCV: 91.3 fL (ref 80.0–100.0)
Platelets: 169 10*3/uL (ref 150–440)
RBC: 4.41 MIL/uL (ref 4.40–5.90)
RDW: 13.5 % (ref 11.5–14.5)
WBC: 6.8 10*3/uL (ref 3.8–10.6)

## 2017-08-09 LAB — GLUCOSE, CAPILLARY: Glucose-Capillary: 89 mg/dL (ref 65–99)

## 2017-08-09 MED ORDER — OXYCODONE HCL 5 MG PO TABS: 5.0000 mg | ORAL_TABLET | Freq: Two times a day (BID) | ORAL | 0 refills | Status: DC | PRN

## 2017-08-09 MED ORDER — ENOXAPARIN SODIUM 40 MG/0.4ML ~~LOC~~ SOLN: 40.0000 mg | SUBCUTANEOUS | 0 refills | Status: DC

## 2017-08-09 MED ORDER — SODIUM CHLORIDE 0.9 % IV BOLUS
500.0000 mL | Freq: Once | INTRAVENOUS | Status: AC
  Administered 2017-08-09: 500 mL via INTRAVENOUS

## 2017-08-09 MED ORDER — BISACODYL 5 MG PO TBEC: 5.0000 mg | DELAYED_RELEASE_TABLET | Freq: Every day | ORAL | 0 refills | Status: DC | PRN

## 2017-08-09 NOTE — Discharge Summary (Signed)
[facility] NAME: Lucas Edwards    MR#:  [id number]  DATE OF BIRTH:  12/14/1950  DATE OF ADMISSION:  08/05/2017 ADMITTING PHYSICIAN: Amelia Jo, MD  DATE OF DISCHARGE: 08/09/2017  PRIMARY CARE PHYSICIAN: Dion Body, MD    ADMISSION DIAGNOSIS:  Closed fracture of left femur, unspecified fracture morphology, unspecified portion of femur, initial encounter ([place]) [S72.92XA]  DISCHARGE DIAGNOSIS:  Active Problems:   Hip fx ([place])   SECONDARY DIAGNOSIS:   Past Medical History:  Diagnosis Date  . Abdominal hernia   . Chest pain    a. 2001 Cath:  nl cors per pt.  Marland Kitchen GERD (gastroesophageal reflux disease)   . History of hypertension   . Hyperlipidemia   . Parkinson disease [facility])     HOSPITAL COURSE:   67 year old male with past medical history significant for hypertension, early dementia, Parkinson's disease who is at an independent living facility was brought in secondary to a mechanical fall  1.  Left femoral neck fracture-secondary to mechanical fall - Appreciate orthopedic consult - Pain control, physical therapy consult after surgery- encouraged to ask for pain meds, so he can do PT in day time. - s/p left hemiarthroplasty, started PT.  - need SNF. D/c today, follow in Ortho clinic in 2 weeks.  2.  Hypertension-blood pressure is stable.  On norvasc.  Monitor  3.  Dementia and Parkinson's-continue outpatient follow-up.  Patient on Aricept, entacapone and sinemet  4. GERD- protonix  5. DVT Prophylaxis- will be started after surgery    DISCHARGE CONDITIONS:   Stable.  CONSULTS OBTAINED:  Treatment Team:  Corky Mull, MD  DRUG ALLERGIES:   Allergies  Allergen Reactions  . Sulfa Antibiotics Hives  . Tylenol [Acetaminophen] Itching    DISCHARGE MEDICATIONS:   Allergies as of 08/09/2017      Reactions   Sulfa Antibiotics Hives   Tylenol [acetaminophen] Itching      Medication  List    TAKE these medications   bisacodyl 5 MG EC tablet Commonly known as:  DULCOLAX Take 1 tablet (5 mg total) by mouth daily as needed for moderate constipation.   carbidopa-levodopa 25-100 MG tablet Commonly known as:  SINEMET IR Take 2 tablets by mouth. TAKE 2 TABLETS BY MOUTH SIX TIMES DAILY AND TAKE 1 ADDITIONAL TABLET BY MOUTH AT BEDTIME   docusate sodium 100 MG capsule Commonly known as:  COLACE Take 100 mg by mouth 4 (four) times daily.   donepezil 10 MG tablet Commonly known as:  ARICEPT Take 10 mg by mouth every evening.   enoxaparin 40 MG/0.4ML injection Commonly known as:  LOVENOX Inject 0.4 mLs (40 mg total) into the skin daily for 25 days. Start taking on:  08/10/2017   entacapone 200 MG tablet Commonly known as:  COMTAN Take 200 mg by mouth 4 (four) times daily.   MYRBETRIQ 25 MG Tb24 tablet Generic drug:  mirabegron ER Take 25 mg by mouth daily.   oxyCODONE 5 MG immediate release tablet Commonly known as:  Oxy IR/ROXICODONE Take 1 tablet (5 mg total) by mouth 2 (two) times daily as needed for severe pain.   pantoprazole 40 MG tablet Commonly known as:  PROTONIX Take 40 mg by mouth daily.        DISCHARGE INSTRUCTIONS:    Follow with Ortho clinic in 2 days,  If you experience worsening of your admission symptoms, develop shortness of breath, life threatening emergency, suicidal or homicidal thoughts  you must seek medical attention immediately by calling 911 or calling your MD immediately  if symptoms less severe.  You Must read complete instructions/literature along with all the possible adverse reactions/side effects for all the Medicines you take and that have been prescribed to you. Take any new Medicines after you have completely understood and accept all the possible adverse reactions/side effects.   Please note  You were cared for by a hospitalist during your hospital stay. If you have any questions about your discharge medications or the  care you received while you were in the hospital after you are discharged, you can call the unit and asked to speak with the hospitalist on call if the hospitalist that took care of you is not available. Once you are discharged, your primary care physician will handle any further medical issues. Please note that NO REFILLS for any discharge medications will be authorized once you are discharged, as it is imperative that you return to your primary care physician (or establish a relationship with a primary care physician if you do not have one) for your aftercare needs so that they can reassess your need for medications and monitor your lab values.    Today   CHIEF COMPLAINT:   Chief Complaint  Patient presents with  . Fall    HISTORY OF PRESENT ILLNESS:  Lucas Edwards  is a 67 y.o. male with a known history of Parkinson's disease, hypertension, hyperlipidemia. Patient was brought to emergency room status post mechanical fall, at home, while going down the stairs.  Patient missed one step and fell down 3-4 stairs.  He immediately felt pain at his left hip and left hand.  He was not able to get up from the floor, due to pain.  He denies any head injury or loss of consciousness.  He denies any neck or back pain. Patient is usually physically active, walking at least 1 mile 3 times a day, every day.  There is no chest pain or shortness of breath with exertion. Upon evaluation in the emergency room, per left hip x-ray, he is noted with acute left transcervical fracture of the femur with varus angulation. No hip joint dislocation.  Blood test done emergency room, including CBC and BMP are essentially unremarkable.  Telemetry strip, reviewed by myself, shows normal sinus rhythm, no acute ischemic changes. Patient is admitted for possible surgical repair of his left femur fracture.   VITAL SIGNS:  Blood pressure (!) 155/95, pulse 82, temperature 97.7 F (36.5 C), temperature source Oral, resp. rate  16, height 5\' 9"  (1.753 m), weight 82.6 kg (182 lb), SpO2 99 %.  I/O:    Intake/Output Summary (Last 24 hours) at 08/09/2017 1240 Last data filed at 08/09/2017 1024 Gross per 24 hour  Intake 480 ml  Output 550 ml  Net -70 ml    PHYSICAL EXAMINATION:   GENERAL:  67 y.o.-year-old patient lying in the bed with no acute distress.  EYES: Pupils equal, round, reactive to light and accommodation. No scleral icterus. Extraocular muscles intact.  HEENT: Head atraumatic, normocephalic. Oropharynx and nasopharynx clear.  NECK:  Supple, no jugular venous distention. No thyroid enlargement, no tenderness.  LUNGS: Normal breath sounds bilaterally, no wheezing, rales,rhonchi or crepitation. No use of accessory muscles of respiration. Decreased bibasilar breath sounds CARDIOVASCULAR: S1, S2 normal. No  rubs, or gallops. 3/6 systolic murmur presnt ABDOMEN: Soft, nontender, nondistended. Bowel sounds present. No organomegaly or mass.  EXTREMITIES: No pedal edema, cyanosis, or clubbing.  Left hip  pain. NEUROLOGIC: Cranial nerves II through XII are intact. Muscle strength 4/5 in all extremities except left lower extremity due to pain. Sensation intact. Gait not checked.  PSYCHIATRIC: The patient is alert and oriented x 2.  SKIN: No obvious rash, lesion, or ulcer.    DATA REVIEW:   CBC Recent Labs  Lab 08/09/17 0339  WBC 6.8  HGB 14.1  HCT 40.2  PLT 169    Chemistries  Recent Labs  Lab 08/09/17 0339  NA 138  K 3.9  CL 102  CO2 30  GLUCOSE 110*  BUN 13  CREATININE 0.81  CALCIUM 8.6*    Cardiac Enzymes Recent Labs  Lab 08/08/17 1232  TROPONINI <0.03    Microbiology Results  Results for orders placed or performed during the hospital encounter of 08/05/17  MRSA PCR Screening     Status: None   Collection Time: 08/06/17  2:00 AM  Result Value Ref Range Status   MRSA by PCR NEGATIVE NEGATIVE Final    Comment:        The GeneXpert MRSA Assay (FDA approved for NASAL  specimens only), is one component of a comprehensive MRSA colonization surveillance program. It is not intended to diagnose MRSA infection nor to guide or monitor treatment for MRSA infections. Performed at [facility], [address], [place], [place] [address]     RADIOLOGY:  No results found.  EKG:   Orders placed or performed during the hospital encounter of 08/05/17  . EKG 12-Lead  . EKG 12-Lead      Management plans discussed with the patient, family and they are in agreement.  CODE STATUS: DNR    Code Status Orders  (From admission, onward)        Start     Ordered   08/06/17 0137  Do not attempt resuscitation (DNR)  Continuous    Question Answer Comment  In the event of cardiac or respiratory ARREST Do not call a "code blue"   In the event of cardiac or respiratory ARREST Do not perform Intubation, CPR, defibrillation or ACLS   In the event of cardiac or respiratory ARREST Use medication by any route, position, wound care, and other measures to relive pain and suffering. May use oxygen, suction and manual treatment of airway obstruction as needed for comfort.   Comments RN may pronounce      08/06/17 0136    Code Status History    Date Active Date Inactive Code Status Order ID Comments User Context   05/14/2017 1656 05/15/2017 1941 DNR [id number]  Nicholes Mango, MD ED    Advance Directive Documentation     Most Recent Value  Type of Advance Directive  Living will  Pre-existing out of facility DNR order (yellow form or pink MOST form)  -  "MOST" Form in Place?  -      TOTAL TIME TAKING CARE OF THIS PATIENT: 35 minutes.    Vaughan Basta M.D on 08/09/2017 at 12:40 PM  Between 7am to 6pm - Pager - [phone]  After 6pm go to [email or web address] - password [email or web address] [facility]  Office  [phone]  CC: Primary care physician; Dion Body, MD   Note: This dictation was prepared with Dragon dictation along  with smaller phrase technology. Any transcriptional errors that result from this process are unintentional.

## 2017-08-09 NOTE — Progress Notes (Signed)
Per MD discharge is cancelled for today. Clinical Education officer, museum (CSW) sent [facility] a message making her aware of above.   [employer], LCSW [phone]

## 2017-08-09 NOTE — Progress Notes (Signed)
PT Cancellation Note  Patient Details Name: Lucas Edwards MRN: [id number] DOB: 04-07-1950   Cancelled Treatment:    Reason Eval/Treat Not Completed: Medical issues which prohibited therapy   DC canceled this pm due to hypotensive.  Will hold session this pm and continue in an as appropriate.   Chesley Noon 08/09/2017, 4:11 PM

## 2017-08-09 NOTE — Discharge Instructions (Signed)
Upon discharge, staples can be removed by the rehab facility staff on 08/19/17. Follow-up with either Dr. Roland Rack or Cameron Proud, PA-C with [facility] in 6 weeks for repeat x-rays. Discharge on Lovenox-40mg  injected once daily for 14 days.

## 2017-08-09 NOTE — Progress Notes (Signed)
Pt is alert and oriented. No complaints of pain during the night. Pt able to have post operative bowel movement. Able to sleep in between care. Surgical dressing is dry and intact.

## 2017-08-09 NOTE — Progress Notes (Signed)
Patient is medically stable for D/C to [facility] today. Per Seth Bake admissions coordinator at [facility] patient can come today to room 219. RN will call report at [phone] and arrange EMS for transport. Clinical Education officer, museum (CSW) sent D/C orders to [facility] via [place]. Patient is aware of above. CSW contacted patient's niece [name] and made her aware of above. Please reconsult if future social work needs arise. CSW signing off.   [employer], LCSW [phone]

## 2017-08-09 NOTE — Progress Notes (Signed)
[name] POA updated on plan of care

## 2017-08-09 NOTE — Clinical Social Work Placement (Signed)
   CLINICAL SOCIAL WORK PLACEMENT  NOTE  Date:  08/09/2017  Patient Details  Name: Lucas Edwards MRN: [id number] Date of Birth: July 12, 1950  Clinical Social Work is seeking post-discharge placement for this patient at the [place] level of care (*CSW will initial, date and re-position this form in  chart as items are completed):  Yes   Patient/family provided with [place] Work Department's list of facilities offering this level of care within the geographic area requested by the patient (or if unable, by the patient's family).  Yes   Patient/family informed of their freedom to choose among providers that offer the needed level of care, that participate in Medicare, Medicaid or managed care program needed by the patient, have an available bed and are willing to accept the patient.  Yes   Patient/family informed of [facility]'s ownership interest in [facility] and [facility], as well as of the fact that they are under no obligation to receive care at these facilities.  PASRR submitted to EDS on 08/06/17     PASRR number received on 08/06/17     Existing PASRR number confirmed on       FL2 transmitted to all facilities in geographic area requested by pt/family on 08/06/17     FL2 transmitted to all facilities within larger geographic area on       Patient informed that his/her managed care company has contracts with or will negotiate with certain facilities, including the following:        Yes   Patient/family informed of bed offers received.  Patient chooses bed at [facility] )     Physician recommends and patient chooses bed at      Patient to be transferred to [facility] ) on 08/09/17.  Patient to be transferred to facility by [facility] )     Patient family notified on 08/09/17 of transfer.  Name of family member notified:  (Patient's niece [name] is aware of D/C today. )     PHYSICIAN        Additional Comment:    _______________________________________________ Arlie Posch, Veronia Beets, LCSW 08/09/2017, 2:44 PM

## 2017-08-09 NOTE — Progress Notes (Signed)
  Subjective: 3 Days Post-Op Procedure(s) (LRB): ARTHROPLASTY BIPOLAR HIP (HEMIARTHROPLASTY) (Left) Patient reports pain as mild.   Patient is well, and has had no acute complaints or problems Plan is for discharge to SNF when medically appropriate. Negative for chest pain and shortness of breath Fever: no Gastrointestinal:Negative for nausea and vomiting  Objective: Vital signs in last 24 hours: Temp:  [97.7 F (36.5 C)-98.7 F (37.1 C)] 97.7 F (36.5 C) (05/13 0744) Pulse Rate:  [76-97] 82 (05/13 0744) Resp:  [16-18] 16 (05/13 0044) BP: (120-155)/(79-95) 155/95 (05/13 0744) SpO2:  [95 %-99 %] 99 % (05/13 0744) Weight:  [82.6 kg (182 lb)] 82.6 kg (182 lb) (05/13 0525)  Intake/Output from previous day:  Intake/Output Summary (Last 24 hours) at 08/09/2017 0752 Last data filed at 08/09/2017 0413 Gross per 24 hour  Intake 480 ml  Output 1450 ml  Net -970 ml    Intake/Output this shift: No intake/output data recorded.  Labs: Recent Labs    08/07/17 0515 08/08/17 0523 08/09/17 0339  HGB 14.4 14.2 14.1   Recent Labs    08/08/17 0523 08/09/17 0339  WBC 7.7 6.8  RBC 4.54 4.41  HCT 41.5 40.2  PLT 162 169   Recent Labs    08/08/17 0523 08/09/17 0339  NA 138 138  K 3.6 3.9  CL 105 102  CO2 28 30  BUN 11 13  CREATININE 0.83 0.81  GLUCOSE 112* 110*  CALCIUM 8.3* 8.6*   No results for input(s): LABPT, INR in the last 72 hours.   EXAM General - Patient is Alert, Appropriate and Oriented Extremity - ABD soft Neurovascular intact Sensation intact distally Intact pulses distally Dorsiflexion/Plantar flexion intact Incision: dressing C/D/I No cellulitis present Dressing/Incision - clean, dry, no drainage Motor Function - intact, moving foot and toes well on exam.   Past Medical History:  Diagnosis Date  . Abdominal hernia   . Chest pain    a. 2001 Cath:  nl cors per pt.  Marland Kitchen GERD (gastroesophageal reflux disease)   . History of hypertension   .  Hyperlipidemia   . Parkinson disease ([place])     Assessment/Plan: 3 Days Post-Op Procedure(s) (LRB): ARTHROPLASTY BIPOLAR HIP (HEMIARTHROPLASTY) (Left) Active Problems:   Hip fx ([facility])  Estimated body mass index is 26.88 kg/m as calculated from the following:   Height as of this encounter: 5\' 9"  (1.753 m).   Weight as of this encounter: 82.6 kg (182 lb). Up with therapy   Labs reviewed this AM. Up with therapy.  Was able to ambulate 50 feet yesterday.   Currently recommending SNF placement. Patient has had a BM.  Upon discharge, staples can be removed by the rehab facility staff on 08/19/17. Follow-up with either Dr. Roland Rack or Cameron Proud, PA-C with [facility] in 6 weeks for repeat x-rays. Discharge on Lovenox-40mg  injected once daily for 14 days.  DVT Prophylaxis - Lovenox, Foot Pumps and TED hose Weight-Bearing as tolerated to left leg  J. Cameron Proud, PA-C [facility] 08/09/2017, 7:52 AM

## 2017-08-09 NOTE — Progress Notes (Signed)
As per nurse, pt is hypotensive and feels dizzi. Systolic BP is around 60. Oxygen sats stable.  I went and seen the pt again in room.  He did not have complain of SOB, chest pain, palpitation. Felt dizzi with the episode. His paster and nurse were in room, as per them, pt was confused during the episode.  Exam- pt is alert and oriented. RS, b/l clear CVA- s1s2N, regular, no murmur. Moves all 4 limbs.  Assessment and plan  Hypotension      May be dehydration   Pt said, he had similar episodes in past.     Will give IV fluids bolus.  r/o infection by Xray chest and UA stat.  Will discontinue the discharge today and monitor. Spoke to niece to update. Critical care time spent 30 min.

## 2017-08-09 NOTE — Care Management Important Message (Signed)
Copy of signed IM left in patient's room.    

## 2017-08-09 NOTE — Progress Notes (Signed)
Physical Therapy Treatment Patient Details Name: Lucas Edwards MRN: [id number] DOB: 07/25/1950 Today's Date: 08/09/2017    History of Present Illness Patient is a 67 year old male admitted from [facility] s/p L hip unipolar hemiarthroplasty with a posterior approach.  PMH includes PD, HLD, Htn, GERd, chest pain and abdominal hernia.    PT Comments    Participated in exercises as described below. To edge of bed with mod a x 1.  Pt was able to stand and ambulate 120' with walker and min assist with mod verbal cues for step sequencing though out session.  Labile at times with support given.  Upon return to room, attempted to use commode for BM but no results but remained up in recliner after session.   Follow Up Recommendations  SNF     Equipment Recommendations       Recommendations for Other Services       Precautions / Restrictions Precautions Precautions: Fall;Posterior Hip Precaution Comments: Fall related to current medical status Restrictions Weight Bearing Restrictions: Yes LLE Weight Bearing: Weight bearing as tolerated    Mobility  Bed Mobility Overal bed mobility: Needs Assistance Bed Mobility: Sit to Supine     Supine to sit: Mod assist        Transfers Overall transfer level: Needs assistance Equipment used: Rolling walker (2 wheeled) Transfers: Sit to/from Stand Sit to Stand: Min assist            Ambulation/Gait Ambulation/Gait assistance: Min assist Ambulation Distance (Feet): 120 Feet Assistive device: Rolling walker (2 wheeled) Gait Pattern/deviations: Step-through pattern;Decreased step length - right;Decreased step length - left;Shuffle   Gait velocity interpretation: <1.8 ft/sec, indicate of risk for recurrent falls     Stairs             Wheelchair Mobility    Modified Rankin (Stroke Patients Only)       Balance Overall balance assessment: Needs assistance Sitting-balance support: Bilateral upper extremity  supported;Feet supported Sitting balance-Leahy Scale: Fair Sitting balance - Comments: unsafe to be left sitting unattended   Standing balance support: Bilateral upper extremity supported Standing balance-Leahy Scale: Poor                 High Level Balance Comments: Slowed gait and min A to maintain balance            Cognition Arousal/Alertness: Awake/alert Behavior During Therapy: WFL for tasks assessed/performed Overall Cognitive Status: Within Functional Limits for tasks assessed                                        Exercises General Exercises - Lower Extremity Ankle Circles/Pumps: 20 reps;AROM;Both;Supine Quad Sets: Strengthening;Left;10 reps;Supine Short Arc Quad: Strengthening;Left;5 reps;Supine Heel Slides: 10 reps;AAROM;Supine;Left Straight Leg Raises: 10 reps;AAROM;Supine;Left    General Comments        Pertinent Vitals/Pain Pain Assessment: Faces Faces Pain Scale: Hurts a little bit Pain Location: L hip with movement Pain Descriptors / Indicators: Aching;Operative site guarding Pain Intervention(s): Limited activity within patient's tolerance    Home Living                      Prior Function            PT Goals (current goals can now be found in the care plan section) Progress towards PT goals: Progressing toward goals    Frequency  BID      PT Plan Current plan remains appropriate    Co-evaluation              AM-PAC PT "6 Clicks" Daily Activity  Outcome Measure  Difficulty turning over in bed (including adjusting bedclothes, sheets and blankets)?: A Little Difficulty moving from lying on back to sitting on the side of the bed? : Unable Difficulty sitting down on and standing up from a chair with arms (e.g., wheelchair, bedside commode, etc,.)?: Unable Help needed moving to and from a bed to chair (including a wheelchair)?: A Little Help needed walking in hospital room?: A Little Help needed  climbing 3-5 steps with a railing? : A Lot 6 Click Score: 13    End of Session Equipment Utilized During Treatment: Gait belt Activity Tolerance: Patient tolerated treatment well Patient left: with call bell/phone within reach;in chair;with chair alarm set   Pain - Right/Left: Left Pain - part of body: Hip     Time: 7014-1030 PT Time Calculation (min) (ACUTE ONLY): 29 min  Charges:  $Gait Training: 8-22 mins $Therapeutic Exercise: 8-22 mins                    G Codes:       Chesley Noon, PTA 08/09/17, 4:05 PM

## 2017-08-09 NOTE — Progress Notes (Signed)
OT Cancellation Note  Patient Details Name: Lucas Edwards MRN: [id number] DOB: 02/09/1951   Cancelled Treatment:    Reason Eval/Treat Not Completed: Medical issues which prohibited therapy. Order received, chart reviewed. Pt hypotensive this afternoon. Will hold OT evaluation and re-attempt next date as appropriate.   Jeni Salles, MPH, MS, OTR/L ascom [phone] 08/09/17, 4:57 PM

## 2017-08-10 DIAGNOSIS — R278 Other lack of coordination: Secondary | ICD-10-CM | POA: Diagnosis not present

## 2017-08-10 DIAGNOSIS — G3183 Dementia with Lewy bodies: Secondary | ICD-10-CM | POA: Diagnosis not present

## 2017-08-10 DIAGNOSIS — S7292XD Unspecified fracture of left femur, subsequent encounter for closed fracture with routine healing: Secondary | ICD-10-CM | POA: Diagnosis not present

## 2017-08-10 DIAGNOSIS — Z7401 Bed confinement status: Secondary | ICD-10-CM | POA: Diagnosis not present

## 2017-08-10 DIAGNOSIS — S7292XA Unspecified fracture of left femur, initial encounter for closed fracture: Secondary | ICD-10-CM | POA: Diagnosis not present

## 2017-08-10 DIAGNOSIS — I959 Hypotension, unspecified: Secondary | ICD-10-CM | POA: Diagnosis not present

## 2017-08-10 DIAGNOSIS — K219 Gastro-esophageal reflux disease without esophagitis: Secondary | ICD-10-CM | POA: Diagnosis not present

## 2017-08-10 DIAGNOSIS — M6281 Muscle weakness (generalized): Secondary | ICD-10-CM | POA: Diagnosis not present

## 2017-08-10 DIAGNOSIS — E785 Hyperlipidemia, unspecified: Secondary | ICD-10-CM | POA: Diagnosis not present

## 2017-08-10 DIAGNOSIS — S72012A Unspecified intracapsular fracture of left femur, initial encounter for closed fracture: Secondary | ICD-10-CM | POA: Diagnosis not present

## 2017-08-10 DIAGNOSIS — G20 Parkinson's disease: Secondary | ICD-10-CM | POA: Diagnosis not present

## 2017-08-10 DIAGNOSIS — Z9181 History of falling: Secondary | ICD-10-CM | POA: Diagnosis not present

## 2017-08-10 DIAGNOSIS — I10 Essential (primary) hypertension: Secondary | ICD-10-CM | POA: Diagnosis not present

## 2017-08-10 DIAGNOSIS — R2681 Unsteadiness on feet: Secondary | ICD-10-CM | POA: Diagnosis not present

## 2017-08-10 DIAGNOSIS — F0390 Unspecified dementia without behavioral disturbance: Secondary | ICD-10-CM | POA: Diagnosis not present

## 2017-08-10 LAB — SURGICAL PATHOLOGY

## 2017-08-10 LAB — GLUCOSE, CAPILLARY: GLUCOSE-CAPILLARY: 103 mg/dL — AB (ref 65–99)

## 2017-08-10 NOTE — Evaluation (Signed)
Occupational Therapy Evaluation Patient Details Name: Lucas Edwards MRN: [id number] DOB: Jan 21, 1951 Today's Date: 08/10/2017    History of Present Illness Patient is a 67 year old male admitted from [facility] s/p L hip unipolar hemiarthroplasty with a posterior approach.  PMH includes PD, HLD, Htn, GERd, chest pain and abdominal hernia.   Clinical Impression   Pt seen for OT evaluation this date, POD#4 from above surgery. Pt was independent in all ADLs prior to surgery (although reports increased time required for LB dressing), active and regularly exercises, and lives at [facility] in an [place] apartment. Pt reports niece assisted in setting pt up with blister packs for medications and pt uses alarms on his phone to maximize adherence. Pt notes difficulty with urinary incontinence and wears Depends which provide him with adequate protection. Pt endorses 3-4 falls in past 12 months and states that he tends to "move a little too quickly" which may have been part of the cause. Pt is eager to return to PLOF with less pain and improved safety and independence. Pt currently requires moderate assist for LB dressing and bathing while in seated position due to pain and limited AROM of L hip. Pt able to recall 1/3 posterior total hip precautions at start of session and unable to verbalize how to implement during ADL and mobility. Pt instructed extensively in posterior total hip precautions and how to implement, self care skills, falls prevention strategies, home/routines modifications, DME/AE for LB bathing and dressing tasks, and compression stocking mgt strategies. Pt able to perform sup>sit EOB with min assist and CGA for transfers and mobility from bed to recliner w/ RW with max verbal cues for sequencing in order to maintain precautions. At end of session, pt only able to recall 2/3 precautions, requiring VC to recall the 3rd. Pt would benefit from additional instruction in self care skills and techniques to  help maintain precautions with or without assistive devices to support recall and carryover prior to discharge. Recommend STR upon discharge.      Follow Up Recommendations  SNF    Equipment Recommendations  3 in 1 bedside commode;Toilet rise with handles    Recommendations for Other Services       Precautions / Restrictions Precautions Precautions: Fall;Posterior Hip Precaution Booklet Issued: No Precaution Comments: Fall related to current medical status; pt able to recall 1/3 precautions, with extensive education/training, pt able to recall 2/3 consistently by end of session Restrictions Weight Bearing Restrictions: Yes LLE Weight Bearing: Weight bearing as tolerated      Mobility Bed Mobility Overal bed mobility: Needs Assistance Bed Mobility: Supine to Sit     Supine to sit: Min assist     General bed mobility comments: assist for LE's and maintaining precautions  Transfers Overall transfer level: Needs assistance Equipment used: Rolling walker (2 wheeled) Transfers: Sit to/from Stand Sit to Stand: Min guard         General transfer comment: max VC for sequencing and maintaining posterior THPs    Balance Overall balance assessment: Needs assistance Sitting-balance support: Bilateral upper extremity supported;Feet supported Sitting balance-Leahy Scale: Fair Sitting balance - Comments: fair+   Standing balance support: Bilateral upper extremity supported Standing balance-Leahy Scale: Poor                             ADL either performed or assessed with clinical judgement   ADL Overall ADL's : Needs assistance/impaired Eating/Feeding: Sitting;Independent   Grooming:  Sitting;Independent   Upper Body Bathing: Sitting;Minimal assistance   Lower Body Bathing: Sit to/from stand;Moderate assistance;Maximal assistance   Upper Body Dressing : Sitting;Minimal assistance   Lower Body Dressing: Sit to/from stand;Moderate assistance;Maximal  assistance Lower Body Dressing Details (indicate cue type and reason): educated in AE for LB dressing while maintaining precautions, will benefit from further training to maximize recall and carryover Toilet Transfer: RW;BSC;Ambulation;Comfort height toilet;Min guard Toilet Transfer Details (indicate cue type and reason): max VC for sequencing to maintain precautions         Functional mobility during ADLs: Min guard;Cueing for safety;Cueing for sequencing;Rolling walker       Vision Baseline Vision/History: Wears glasses(pt reports decreased peripheral vision in L eye) Wears Glasses: At all times Patient Visual Report: No change from baseline       Perception     Praxis      Pertinent Vitals/Pain Pain Assessment: 0-10 Pain Score: 8  Pain Location: 2/10 at rest, 8/10 L hip w/ movement Pain Descriptors / Indicators: Aching;Operative site guarding Pain Intervention(s): Limited activity within patient's tolerance;Premedicated before session;Monitored during session;Repositioned     Hand Dominance Right   Extremity/Trunk Assessment Upper Extremity Assessment Upper Extremity Assessment: Overall WFL for tasks assessed(grossly at least 4+/5)   Lower Extremity Assessment Lower Extremity Assessment: Defer to PT evaluation;Overall WFL for tasks assessed;LLE deficits/detail   Cervical / Trunk Assessment Cervical / Trunk Assessment: Kyphotic(mild)   Communication Communication Communication: No difficulties   Cognition Arousal/Alertness: Awake/alert Behavior During Therapy: WFL for tasks assessed/performed Overall Cognitive Status: No family/caregiver present to determine baseline cognitive functioning                                 General Comments: Cues for redirection, decreased immediate and delayed recall (pt endorses memory difficulties at baseline), able to follow all commands but requires max cues for sequencing   General Comments       Exercises  Other Exercises Other Exercises: Pt educated in use of BSC over commode to maintain precautions and improve safety with transfers   Shoulder Instructions      Home Living Family/patient expects to be discharged to:: Skilled nursing facility                                        Prior Functioning/Environment Level of Independence: Independent        Comments: Pt participates in exercise daily, including boxing for PD, and walks 1 mi. 3x/week.  Not familiar with use of AD. Niece set up blister packs for medications and pt uses alarms on his phone to help remind him to take them. Has difficulty opening blister packs and typically gets someone to assist him        OT Problem List: Decreased strength;Decreased knowledge of use of DME or AE;Decreased knowledge of precautions;Decreased activity tolerance;Decreased cognition;Pain;Impaired balance (sitting and/or standing);Decreased safety awareness      OT Treatment/Interventions: Self-care/ADL training;Balance training;Therapeutic exercise;Therapeutic activities;Neuromuscular education;DME and/or AE instruction;Patient/family education;Cognitive remediation/compensation    OT Goals(Current goals can be found in the care plan section) Acute Rehab OT Goals Patient Stated Goal: To return to regular activity and exercise program. OT Goal Formulation: With patient Time For Goal Achievement: 08/24/17 Potential to Achieve Goals: Good ADL Goals Pt Will Perform Lower Body Dressing: with supervision;sit to/from stand;with adaptive equipment(maintaining posterior THPs) Pt  Will Transfer to Toilet: with supervision;ambulating(comfort height w/ rails, maintain post. THPs)  OT Frequency: Min 1X/week   Barriers to D/C:            Co-evaluation              AM-PAC PT "6 Clicks" Daily Activity     Outcome Measure Help from another person eating meals?: None Help from another person taking care of personal grooming?: A  Little Help from another person toileting, which includes using toliet, bedpan, or urinal?: A Little Help from another person bathing (including washing, rinsing, drying)?: A Lot Help from another person to put on and taking off regular upper body clothing?: A Little Help from another person to put on and taking off regular lower body clothing?: A Lot 6 Click Score: 17   End of Session Equipment Utilized During Treatment: Gait belt;Rolling walker  Activity Tolerance: Patient tolerated treatment well Patient left: in chair;with call bell/phone within reach;with chair alarm set;Other (comment)(abductor pillow in place)  OT Visit Diagnosis: Other abnormalities of gait and mobility (R26.89);History of falling (Z91.81);Muscle weakness (generalized) (M62.81);Pain Pain - Right/Left: Left Pain - part of body: Hip                Time: 6962-9528 OT Time Calculation (min): 61 min Charges:  OT General Charges $OT Visit: 1 Visit OT Evaluation $OT Eval Moderate Complexity: 1 Mod OT Treatments $Self Care/Home Management : 38-52 mins  Jeni Salles, MPH, MS, OTR/L ascom [phone] 08/10/17, 9:34 AM

## 2017-08-10 NOTE — Progress Notes (Signed)
VSS. Pt in no acute distress. RN called report to [facility]. IV removed. EMS called to transport the pt.

## 2017-08-10 NOTE — Discharge Summary (Signed)
[facility] [place] NAME: Lucas Edwards    MR#:  [id number]  DATE OF BIRTH:  05-21-1950  DATE OF ADMISSION:  08/05/2017 ADMITTING PHYSICIAN: Amelia Jo, MD  DATE OF DISCHARGE: 08/10/2017  PRIMARY CARE PHYSICIAN: Dion Body, MD    ADMISSION DIAGNOSIS:  Closed fracture of left femur, unspecified fracture morphology, unspecified portion of femur, initial encounter ([place]) [S72.92XA]  DISCHARGE DIAGNOSIS:  Active Problems:   Hip fx ([place])   SECONDARY DIAGNOSIS:   Past Medical History:  Diagnosis Date  . Abdominal hernia   . Chest pain    a. 2001 Cath:  nl cors per pt.  Marland Kitchen GERD (gastroesophageal reflux disease)   . History of hypertension   . Hyperlipidemia   . Parkinson disease [facility])     HOSPITAL COURSE:   67 year old male with past medical history significant for hypertension, early dementia, Parkinson's disease who is at an independent living facility was brought in secondary to a mechanical fall  1.  Left femoral neck fracture-secondary to mechanical fall - Appreciate orthopedic consult - Pain control, physical therapy consult after surgery- encouraged to ask for pain meds, so he can do PT in day time. - s/p left hemiarthroplasty, started PT.  - need SNF. D/c today, follow in Ortho clinic in 2 weeks.  2.  Hypertension-blood pressure is stable.  On norvasc.  Monitor  3.  Dementia and Parkinson's-continue outpatient follow-up.  Patient on Aricept, entacapone and sinemet  4. GERD- protonix  5. DVT Prophylaxis- will be started after surgery  6. Hypotension-  Responded to IV fluids, no infection. Stable now.  DISCHARGE CONDITIONS:   Stable.  CONSULTS OBTAINED:  Treatment Team:  Corky Mull, MD  DRUG ALLERGIES:   Allergies  Allergen Reactions  . Sulfa Antibiotics Hives  . Tylenol [Acetaminophen] Itching    DISCHARGE MEDICATIONS:   Allergies as of 08/10/2017      Reactions   Sulfa  Antibiotics Hives   Tylenol [acetaminophen] Itching      Medication List    TAKE these medications   bisacodyl 5 MG EC tablet Commonly known as:  DULCOLAX Take 1 tablet (5 mg total) by mouth daily as needed for moderate constipation.   carbidopa-levodopa 25-100 MG tablet Commonly known as:  SINEMET IR Take 2 tablets by mouth. TAKE 2 TABLETS BY MOUTH SIX TIMES DAILY AND TAKE 1 ADDITIONAL TABLET BY MOUTH AT BEDTIME   docusate sodium 100 MG capsule Commonly known as:  COLACE Take 100 mg by mouth 4 (four) times daily.   donepezil 10 MG tablet Commonly known as:  ARICEPT Take 10 mg by mouth every evening.   enoxaparin 40 MG/0.4ML injection Commonly known as:  LOVENOX Inject 0.4 mLs (40 mg total) into the skin daily for 25 days.   entacapone 200 MG tablet Commonly known as:  COMTAN Take 200 mg by mouth 4 (four) times daily.   MYRBETRIQ 25 MG Tb24 tablet Generic drug:  mirabegron ER Take 25 mg by mouth daily.   oxyCODONE 5 MG immediate release tablet Commonly known as:  Oxy IR/ROXICODONE Take 1 tablet (5 mg total) by mouth 2 (two) times daily as needed for severe pain.   pantoprazole 40 MG tablet Commonly known as:  PROTONIX Take 40 mg by mouth daily.        DISCHARGE INSTRUCTIONS:    Follow with Ortho clinic in 2 days,  If you experience worsening of your admission symptoms, develop shortness of breath, life threatening  emergency, suicidal or homicidal thoughts you must seek medical attention immediately by calling 911 or calling your MD immediately  if symptoms less severe.  You Must read complete instructions/literature along with all the possible adverse reactions/side effects for all the Medicines you take and that have been prescribed to you. Take any new Medicines after you have completely understood and accept all the possible adverse reactions/side effects.   Please note  You were cared for by a hospitalist during your hospital stay. If you have any  questions about your discharge medications or the care you received while you were in the hospital after you are discharged, you can call the unit and asked to speak with the hospitalist on call if the hospitalist that took care of you is not available. Once you are discharged, your primary care physician will handle any further medical issues. Please note that NO REFILLS for any discharge medications will be authorized once you are discharged, as it is imperative that you return to your primary care physician (or establish a relationship with a primary care physician if you do not have one) for your aftercare needs so that they can reassess your need for medications and monitor your lab values.    Today   CHIEF COMPLAINT:   Chief Complaint  Patient presents with  . Fall    HISTORY OF PRESENT ILLNESS:  Lucas Edwards  is a 67 y.o. male with a known history of Parkinson's disease, hypertension, hyperlipidemia. Patient was brought to emergency room status post mechanical fall, at home, while going down the stairs.  Patient missed one step and fell down 3-4 stairs.  He immediately felt pain at his left hip and left hand.  He was not able to get up from the floor, due to pain.  He denies any head injury or loss of consciousness.  He denies any neck or back pain. Patient is usually physically active, walking at least 1 mile 3 times a day, every day.  There is no chest pain or shortness of breath with exertion. Upon evaluation in the emergency room, per left hip x-ray, he is noted with acute left transcervical fracture of the femur with varus angulation. No hip joint dislocation.  Blood test done emergency room, including CBC and BMP are essentially unremarkable.  Telemetry strip, reviewed by myself, shows normal sinus rhythm, no acute ischemic changes. Patient is admitted for possible surgical repair of his left femur fracture.   VITAL SIGNS:  Blood pressure 107/69, pulse 70, temperature (!) 97.4  F (36.3 C), resp. rate 18, height 5\' 9"  (1.753 m), weight 82.6 kg (182 lb 1.6 oz), SpO2 98 %.  I/O:    Intake/Output Summary (Last 24 hours) at 08/10/2017 1224 Last data filed at 08/10/2017 1024 Gross per 24 hour  Intake 440 ml  Output -  Net 440 ml    PHYSICAL EXAMINATION:   GENERAL:  67 y.o.-year-old patient lying in the bed with no acute distress.  EYES: Pupils equal, round, reactive to light and accommodation. No scleral icterus. Extraocular muscles intact.  HEENT: Head atraumatic, normocephalic. Oropharynx and nasopharynx clear.  NECK:  Supple, no jugular venous distention. No thyroid enlargement, no tenderness.  LUNGS: Normal breath sounds bilaterally, no wheezing, rales,rhonchi or crepitation. No use of accessory muscles of respiration. Decreased bibasilar breath sounds CARDIOVASCULAR: S1, S2 normal. No  rubs, or gallops. 3/6 systolic murmur presnt ABDOMEN: Soft, nontender, nondistended. Bowel sounds present. No organomegaly or mass.  EXTREMITIES: No pedal edema, cyanosis, or clubbing.  Left hip pain. NEUROLOGIC: Cranial nerves II through XII are intact. Muscle strength 4/5 in all extremities except left lower extremity due to pain. Sensation intact. Gait not checked.  PSYCHIATRIC: The patient is alert and oriented x 2.  SKIN: No obvious rash, lesion, or ulcer.    DATA REVIEW:   CBC Recent Labs  Lab 08/09/17 0339  WBC 6.8  HGB 14.1  HCT 40.2  PLT 169    Chemistries  Recent Labs  Lab 08/09/17 0339  NA 138  K 3.9  CL 102  CO2 30  GLUCOSE 110*  BUN 13  CREATININE 0.81  CALCIUM 8.6*    Cardiac Enzymes Recent Labs  Lab 08/08/17 1232  TROPONINI <0.03    Microbiology Results  Results for orders placed or performed during the hospital encounter of 08/05/17  MRSA PCR Screening     Status: None   Collection Time: 08/06/17  2:00 AM  Result Value Ref Range Status   MRSA by PCR NEGATIVE NEGATIVE Final    Comment:        The GeneXpert MRSA Assay  (FDA approved for NASAL specimens only), is one component of a comprehensive MRSA colonization surveillance program. It is not intended to diagnose MRSA infection nor to guide or monitor treatment for MRSA infections. Performed at [facility], [address] [place], [place] [address]     RADIOLOGY:  Dg Chest 2 View  Result Date: 08/09/2017 CLINICAL DATA:  Hypotension EXAM: CHEST - 2 VIEW COMPARISON:  08/05/2017 FINDINGS: Normal heart size and mediastinal contours. Negative for failure or pneumonia. No effusion or pneumothorax. No significant osseous finding. IMPRESSION: Negative chest. Electronically Signed   By: Monte Fantasia M.D.   On: 08/09/2017 16:35    EKG:   Orders placed or performed during the hospital encounter of 08/05/17  . EKG 12-Lead  . EKG 12-Lead      Management plans discussed with the patient, family and they are in agreement.  CODE STATUS: DNR    Code Status Orders  (From admission, onward)        Start     Ordered   08/06/17 0137  Do not attempt resuscitation (DNR)  Continuous    Question Answer Comment  In the event of cardiac or respiratory ARREST Do not call a "code blue"   In the event of cardiac or respiratory ARREST Do not perform Intubation, CPR, defibrillation or ACLS   In the event of cardiac or respiratory ARREST Use medication by any route, position, wound care, and other measures to relive pain and suffering. May use oxygen, suction and manual treatment of airway obstruction as needed for comfort.   Comments RN may pronounce      08/06/17 0136    Code Status History    Date Active Date Inactive Code Status Order ID Comments User Context   05/14/2017 1656 05/15/2017 1941 DNR [id number]  Nicholes Mango, MD ED    Advance Directive Documentation     Most Recent Value  Type of Advance Directive  Living will  Pre-existing out of facility DNR order (yellow form or pink MOST form)  -  "MOST" Form in Place?  -      TOTAL TIME  TAKING CARE OF THIS PATIENT: 35 minutes.    Vaughan Basta M.D on 08/10/2017 at 12:24 PM  Between 7am to 6pm - Pager - [phone]  After 6pm go to [email or web address] - password [email or web address] [facility]  Office  [phone]  CC: Primary  care physician; Dion Body, MD   Note: This dictation was prepared with Dragon dictation along with smaller phrase technology. Any transcriptional errors that result from this process are unintentional.

## 2017-08-10 NOTE — Progress Notes (Signed)
Physical Therapy Treatment Patient Details Name: Lucas Edwards MRN: [id number] DOB: Jun 18, 1950 Today's Date: 08/10/2017    History of Present Illness 67 year old male admitted from [facility] s/p L hip unipolar hemiarthroplasty with a posterior approach.  PMH includes PD, HLD, Htn, GERd, chest pain and abdominal hernia.    PT Comments    Pt showed very good effort with PT session despite needed heavy cuing with a lot of tasks.  Initially he showed very poor, labored, Parkinsonian gait, but with some training and direct assist to advance walker he was able to maintain consistent cadence and speed much better than previous sessions.  Pt still showed some unsteadiness but is making improvements with mobility, gait, strength. He was able to recall 2/3 precautions, needing cuing for last one.   Follow Up Recommendations  SNF     Equipment Recommendations       Recommendations for Other Services       Precautions / Restrictions Precautions Precautions: Fall;Posterior Hip Precaution Booklet Issued: No Precaution Comments: Fall related to current medical status; pt able to recall 1/3 precautions, with extensive education/training, pt able to recall 2/3 consistently by end of session Restrictions Weight Bearing Restrictions: Yes LLE Weight Bearing: Weight bearing as tolerated    Mobility  Bed Mobility Overal bed mobility: Needs Assistance Bed Mobility: Supine to Sit     Supine to sit: Min assist     General bed mobility comments: Pt in recliner on arrival  Transfers Overall transfer level: Needs assistance Equipment used: Rolling walker (2 wheeled) Transfers: Sit to/from Stand Sit to Stand: Min guard         General transfer comment: reminders for hand placement and sequencing, generally did well  Ambulation/Gait Ambulation/Gait assistance: Min assist Ambulation Distance (Feet): 120 Feet Assistive device: Rolling walker (2 wheeled)       General Gait Details: Pt  intially very segmented with trying to coordinate R/L steppage and generally needed a lot of cuing to be able to piece together even a few steps in concert.  Once in the hallway PT was able to advance walker and encourage pt to maintain consistent cadence and ultimatley let go of walker and pt was able to maintain relatively smooth/consistent pace.  He had some hesitant and Parkinsonian moments, but showed greatly improved speed, confidence and cadence once catalyst of assisted walker motion was given.   Stairs             Wheelchair Mobility    Modified Rankin (Stroke Patients Only)       Balance Overall balance assessment: Needs assistance Sitting-balance support: Bilateral upper extremity supported;Feet supported Sitting balance-Leahy Scale: Fair Sitting balance - Comments: fair+   Standing balance support: Bilateral upper extremity supported Standing balance-Leahy Scale: Fair                              Cognition Arousal/Alertness: Awake/alert Behavior During Therapy: WFL for tasks assessed/performed Overall Cognitive Status: (some confusion, but able to participate well)                                 General Comments: Cues for redirection, decreased immediate and delayed recall (pt endorses memory difficulties at baseline), able to follow all commands but requires max cues for sequencing      Exercises Total Joint Exercises Ankle Circles/Pumps: AROM;10 reps Heel Slides: Strengthening;10 reps  Hip ABduction/ADduction: Strengthening;10 reps Long Arc Quad: Strengthening;10 Theatre manager in Standing: Seated;Strengthening;10 reps Other Exercises Other Exercises: Pt educated in use of BSC over commode to maintain precautions and improve safety with transfers    General Comments        Pertinent Vitals/Pain Pain Assessment: 0-10 Pain Score: 6  Pain Location: 2/10 at rest, 8/10 L hip w/ movement Pain Descriptors / Indicators:  Aching;Operative site guarding Pain Intervention(s): Limited activity within patient's tolerance;Premedicated before session;Monitored during session;Repositioned    Home Living Family/patient expects to be discharged to:: Skilled nursing facility                    Prior Function Level of Independence: Independent      Comments: Pt participates in exercise daily, including boxing for PD, and walks 1 mi. 3x/week.  Not familiar with use of AD. Niece set up blister packs for medications and pt uses alarms on his phone to help remind him to take them. Has difficulty opening blister packs and typically gets someone to assist him   PT Goals (current goals can now be found in the care plan section) Acute Rehab PT Goals Patient Stated Goal: To return to regular activity and exercise program. Progress towards PT goals: Progressing toward goals    Frequency    BID      PT Plan Current plan remains appropriate    Co-evaluation              AM-PAC PT "6 Clicks" Daily Activity  Outcome Measure  Difficulty turning over in bed (including adjusting bedclothes, sheets and blankets)?: A Little Difficulty moving from lying on back to sitting on the side of the bed? : Unable Difficulty sitting down on and standing up from a chair with arms (e.g., wheelchair, bedside commode, etc,.)?: A Little Help needed moving to and from a bed to chair (including a wheelchair)?: A Little Help needed walking in hospital room?: A Little Help needed climbing 3-5 steps with a railing? : A Lot 6 Click Score: 15    End of Session Equipment Utilized During Treatment: Gait belt Activity Tolerance: Patient tolerated treatment well Patient left: with call bell/phone within reach;with chair alarm set   PT Visit Diagnosis: Unsteadiness on feet (R26.81);Muscle weakness (generalized) (M62.81);Pain Pain - Right/Left: Left Pain - part of body: Hip     Time: 9024-0973 PT Time Calculation (min) (ACUTE  ONLY): 28 min  Charges:  $Gait Training: 8-22 mins $Therapeutic Exercise: 8-22 mins                    G Codes:       Kreg Shropshire, DPT 08/10/2017, 11:17 AM

## 2017-08-10 NOTE — Clinical Social Work Placement (Signed)
   CLINICAL SOCIAL WORK PLACEMENT  NOTE  Date:  08/10/2017  Patient Details  Name: Lucas Edwards MRN: [id number] Date of Birth: [birth year]  Clinical Social Work is seeking post-discharge placement for this patient at the [place] level of care (*CSW will initial, date and re-position this form in  chart as items are completed):  Yes   Patient/family provided with [place] Work Department's list of facilities offering this level of care within the geographic area requested by the patient (or if unable, by the patient's family).  Yes   Patient/family informed of their freedom to choose among providers that offer the needed level of care, that participate in Medicare, Medicaid or managed care program needed by the patient, have an available bed and are willing to accept the patient.  Yes   Patient/family informed of [place]'s ownership interest in [facility] and [facility], as well as of the fact that they are under no obligation to receive care at these facilities.  PASRR submitted to EDS on 08/06/17     PASRR number received on 08/06/17     Existing PASRR number confirmed on       FL2 transmitted to all facilities in geographic area requested by pt/family on 08/06/17     FL2 transmitted to all facilities within larger geographic area on       Patient informed that his/her managed care company has contracts with or will negotiate with certain facilities, including the following:        Yes   Patient/family informed of bed offers received.  Patient chooses bed at [facility] )     Physician recommends and patient chooses bed at      Patient to be transferred to [facility] ) on 08/10/17.  Patient to be transferred to facility by [facility] )     Patient family notified on 08/10/17 of transfer.  Name of family member notified:  (Patient's niece [name] is aware of D/C today. )     PHYSICIAN        Additional Comment:    _______________________________________________ Alysabeth Scalia, Veronia Beets, LCSW 08/10/2017, 1:30 PM

## 2017-08-10 NOTE — Progress Notes (Signed)
Patient is medically stable for D/C to [facility] today. Per Seth Bake admissions coordinator at [facility] patient can come today to room 219. RN will call report at [phone] and arrange EMS for transport. Clinical Education officer, museum (CSW) sent D/C orders to [facility] via [place]. Patient is aware of above. CSW contacted patient's niece [name] and made her aware of above. Please reconsult if future social work needs arise. CSW signing off.   [employer], LCSW [phone]

## 2017-08-12 ENCOUNTER — Ambulatory Visit: Payer: Self-pay | Admitting: Urology

## 2017-08-13 ENCOUNTER — Ambulatory Visit: Payer: Self-pay | Admitting: Urology

## 2017-08-30 DIAGNOSIS — R2681 Unsteadiness on feet: Secondary | ICD-10-CM | POA: Diagnosis not present

## 2017-08-30 DIAGNOSIS — G20 Parkinson's disease: Secondary | ICD-10-CM | POA: Diagnosis not present

## 2017-08-30 DIAGNOSIS — Z9181 History of falling: Secondary | ICD-10-CM | POA: Diagnosis not present

## 2017-08-30 DIAGNOSIS — R278 Other lack of coordination: Secondary | ICD-10-CM | POA: Diagnosis not present

## 2017-08-30 DIAGNOSIS — Z741 Need for assistance with personal care: Secondary | ICD-10-CM | POA: Diagnosis not present

## 2017-08-31 DIAGNOSIS — R2681 Unsteadiness on feet: Secondary | ICD-10-CM | POA: Diagnosis not present

## 2017-08-31 DIAGNOSIS — Z9181 History of falling: Secondary | ICD-10-CM | POA: Diagnosis not present

## 2017-08-31 DIAGNOSIS — Z741 Need for assistance with personal care: Secondary | ICD-10-CM | POA: Diagnosis not present

## 2017-08-31 DIAGNOSIS — G20 Parkinson's disease: Secondary | ICD-10-CM | POA: Diagnosis not present

## 2017-08-31 DIAGNOSIS — R278 Other lack of coordination: Secondary | ICD-10-CM | POA: Diagnosis not present

## 2017-09-02 DIAGNOSIS — Z741 Need for assistance with personal care: Secondary | ICD-10-CM | POA: Diagnosis not present

## 2017-09-02 DIAGNOSIS — G20 Parkinson's disease: Secondary | ICD-10-CM | POA: Diagnosis not present

## 2017-09-02 DIAGNOSIS — Z9181 History of falling: Secondary | ICD-10-CM | POA: Diagnosis not present

## 2017-09-02 DIAGNOSIS — R2681 Unsteadiness on feet: Secondary | ICD-10-CM | POA: Diagnosis not present

## 2017-09-02 DIAGNOSIS — R278 Other lack of coordination: Secondary | ICD-10-CM | POA: Diagnosis not present

## 2017-09-06 DIAGNOSIS — R2681 Unsteadiness on feet: Secondary | ICD-10-CM | POA: Diagnosis not present

## 2017-09-06 DIAGNOSIS — R278 Other lack of coordination: Secondary | ICD-10-CM | POA: Diagnosis not present

## 2017-09-06 DIAGNOSIS — Z9181 History of falling: Secondary | ICD-10-CM | POA: Diagnosis not present

## 2017-09-06 DIAGNOSIS — Z741 Need for assistance with personal care: Secondary | ICD-10-CM | POA: Diagnosis not present

## 2017-09-06 DIAGNOSIS — G20 Parkinson's disease: Secondary | ICD-10-CM | POA: Diagnosis not present

## 2017-09-07 DIAGNOSIS — R2681 Unsteadiness on feet: Secondary | ICD-10-CM | POA: Diagnosis not present

## 2017-09-07 DIAGNOSIS — R278 Other lack of coordination: Secondary | ICD-10-CM | POA: Diagnosis not present

## 2017-09-07 DIAGNOSIS — G20 Parkinson's disease: Secondary | ICD-10-CM | POA: Diagnosis not present

## 2017-09-07 DIAGNOSIS — Z741 Need for assistance with personal care: Secondary | ICD-10-CM | POA: Diagnosis not present

## 2017-09-07 DIAGNOSIS — Z9181 History of falling: Secondary | ICD-10-CM | POA: Diagnosis not present

## 2017-09-09 DIAGNOSIS — R278 Other lack of coordination: Secondary | ICD-10-CM | POA: Diagnosis not present

## 2017-09-09 DIAGNOSIS — R2681 Unsteadiness on feet: Secondary | ICD-10-CM | POA: Diagnosis not present

## 2017-09-09 DIAGNOSIS — G20 Parkinson's disease: Secondary | ICD-10-CM | POA: Diagnosis not present

## 2017-09-09 DIAGNOSIS — Z741 Need for assistance with personal care: Secondary | ICD-10-CM | POA: Diagnosis not present

## 2017-09-09 DIAGNOSIS — Z9181 History of falling: Secondary | ICD-10-CM | POA: Diagnosis not present

## 2017-09-13 DIAGNOSIS — G20 Parkinson's disease: Secondary | ICD-10-CM | POA: Diagnosis not present

## 2017-09-13 DIAGNOSIS — Z9181 History of falling: Secondary | ICD-10-CM | POA: Diagnosis not present

## 2017-09-13 DIAGNOSIS — Z741 Need for assistance with personal care: Secondary | ICD-10-CM | POA: Diagnosis not present

## 2017-09-13 DIAGNOSIS — R278 Other lack of coordination: Secondary | ICD-10-CM | POA: Diagnosis not present

## 2017-09-13 DIAGNOSIS — R2681 Unsteadiness on feet: Secondary | ICD-10-CM | POA: Diagnosis not present

## 2017-09-15 DIAGNOSIS — Z9181 History of falling: Secondary | ICD-10-CM | POA: Diagnosis not present

## 2017-09-15 DIAGNOSIS — R278 Other lack of coordination: Secondary | ICD-10-CM | POA: Diagnosis not present

## 2017-09-15 DIAGNOSIS — R2681 Unsteadiness on feet: Secondary | ICD-10-CM | POA: Diagnosis not present

## 2017-09-15 DIAGNOSIS — G20 Parkinson's disease: Secondary | ICD-10-CM | POA: Diagnosis not present

## 2017-09-15 DIAGNOSIS — Z741 Need for assistance with personal care: Secondary | ICD-10-CM | POA: Diagnosis not present

## 2017-09-16 DIAGNOSIS — G20 Parkinson's disease: Secondary | ICD-10-CM | POA: Diagnosis not present

## 2017-09-16 DIAGNOSIS — Z9181 History of falling: Secondary | ICD-10-CM | POA: Diagnosis not present

## 2017-09-16 DIAGNOSIS — R278 Other lack of coordination: Secondary | ICD-10-CM | POA: Diagnosis not present

## 2017-09-16 DIAGNOSIS — Z741 Need for assistance with personal care: Secondary | ICD-10-CM | POA: Diagnosis not present

## 2017-09-16 DIAGNOSIS — R2681 Unsteadiness on feet: Secondary | ICD-10-CM | POA: Diagnosis not present

## 2017-09-17 DIAGNOSIS — Z96642 Presence of left artificial hip joint: Secondary | ICD-10-CM | POA: Diagnosis not present

## 2017-09-20 DIAGNOSIS — Z741 Need for assistance with personal care: Secondary | ICD-10-CM | POA: Diagnosis not present

## 2017-09-20 DIAGNOSIS — R2681 Unsteadiness on feet: Secondary | ICD-10-CM | POA: Diagnosis not present

## 2017-09-20 DIAGNOSIS — G20 Parkinson's disease: Secondary | ICD-10-CM | POA: Diagnosis not present

## 2017-09-20 DIAGNOSIS — Z9181 History of falling: Secondary | ICD-10-CM | POA: Diagnosis not present

## 2017-09-20 DIAGNOSIS — R278 Other lack of coordination: Secondary | ICD-10-CM | POA: Diagnosis not present

## 2017-09-22 DIAGNOSIS — R278 Other lack of coordination: Secondary | ICD-10-CM | POA: Diagnosis not present

## 2017-09-22 DIAGNOSIS — G20 Parkinson's disease: Secondary | ICD-10-CM | POA: Diagnosis not present

## 2017-09-22 DIAGNOSIS — Z9181 History of falling: Secondary | ICD-10-CM | POA: Diagnosis not present

## 2017-09-22 DIAGNOSIS — Z741 Need for assistance with personal care: Secondary | ICD-10-CM | POA: Diagnosis not present

## 2017-09-22 DIAGNOSIS — R2681 Unsteadiness on feet: Secondary | ICD-10-CM | POA: Diagnosis not present

## 2017-09-24 DIAGNOSIS — G20 Parkinson's disease: Secondary | ICD-10-CM | POA: Diagnosis not present

## 2017-09-24 DIAGNOSIS — R278 Other lack of coordination: Secondary | ICD-10-CM | POA: Diagnosis not present

## 2017-09-24 DIAGNOSIS — Z9181 History of falling: Secondary | ICD-10-CM | POA: Diagnosis not present

## 2017-09-24 DIAGNOSIS — R2681 Unsteadiness on feet: Secondary | ICD-10-CM | POA: Diagnosis not present

## 2017-09-24 DIAGNOSIS — Z741 Need for assistance with personal care: Secondary | ICD-10-CM | POA: Diagnosis not present

## 2017-09-28 DIAGNOSIS — R4189 Other symptoms and signs involving cognitive functions and awareness: Secondary | ICD-10-CM | POA: Diagnosis not present

## 2017-09-28 DIAGNOSIS — G20 Parkinson's disease: Secondary | ICD-10-CM | POA: Diagnosis not present

## 2017-09-28 DIAGNOSIS — Z741 Need for assistance with personal care: Secondary | ICD-10-CM | POA: Diagnosis not present

## 2017-09-28 DIAGNOSIS — R278 Other lack of coordination: Secondary | ICD-10-CM | POA: Diagnosis not present

## 2017-09-28 DIAGNOSIS — Z9181 History of falling: Secondary | ICD-10-CM | POA: Diagnosis not present

## 2017-09-28 DIAGNOSIS — R2681 Unsteadiness on feet: Secondary | ICD-10-CM | POA: Diagnosis not present

## 2017-09-29 DIAGNOSIS — R278 Other lack of coordination: Secondary | ICD-10-CM | POA: Diagnosis not present

## 2017-09-29 DIAGNOSIS — Z741 Need for assistance with personal care: Secondary | ICD-10-CM | POA: Diagnosis not present

## 2017-09-29 DIAGNOSIS — Z9181 History of falling: Secondary | ICD-10-CM | POA: Diagnosis not present

## 2017-09-29 DIAGNOSIS — R4189 Other symptoms and signs involving cognitive functions and awareness: Secondary | ICD-10-CM | POA: Diagnosis not present

## 2017-09-29 DIAGNOSIS — G20 Parkinson's disease: Secondary | ICD-10-CM | POA: Diagnosis not present

## 2017-09-29 DIAGNOSIS — R2681 Unsteadiness on feet: Secondary | ICD-10-CM | POA: Diagnosis not present

## 2017-10-01 DIAGNOSIS — R4189 Other symptoms and signs involving cognitive functions and awareness: Secondary | ICD-10-CM | POA: Diagnosis not present

## 2017-10-01 DIAGNOSIS — R278 Other lack of coordination: Secondary | ICD-10-CM | POA: Diagnosis not present

## 2017-10-01 DIAGNOSIS — Z9181 History of falling: Secondary | ICD-10-CM | POA: Diagnosis not present

## 2017-10-01 DIAGNOSIS — R2681 Unsteadiness on feet: Secondary | ICD-10-CM | POA: Diagnosis not present

## 2017-10-01 DIAGNOSIS — Z741 Need for assistance with personal care: Secondary | ICD-10-CM | POA: Diagnosis not present

## 2017-10-01 DIAGNOSIS — G20 Parkinson's disease: Secondary | ICD-10-CM | POA: Diagnosis not present

## 2017-10-04 DIAGNOSIS — Z741 Need for assistance with personal care: Secondary | ICD-10-CM | POA: Diagnosis not present

## 2017-10-04 DIAGNOSIS — R2681 Unsteadiness on feet: Secondary | ICD-10-CM | POA: Diagnosis not present

## 2017-10-04 DIAGNOSIS — R278 Other lack of coordination: Secondary | ICD-10-CM | POA: Diagnosis not present

## 2017-10-04 DIAGNOSIS — R4189 Other symptoms and signs involving cognitive functions and awareness: Secondary | ICD-10-CM | POA: Diagnosis not present

## 2017-10-04 DIAGNOSIS — Z9181 History of falling: Secondary | ICD-10-CM | POA: Diagnosis not present

## 2017-10-04 DIAGNOSIS — G20 Parkinson's disease: Secondary | ICD-10-CM | POA: Diagnosis not present

## 2017-10-06 DIAGNOSIS — R2681 Unsteadiness on feet: Secondary | ICD-10-CM | POA: Diagnosis not present

## 2017-10-06 DIAGNOSIS — Z741 Need for assistance with personal care: Secondary | ICD-10-CM | POA: Diagnosis not present

## 2017-10-06 DIAGNOSIS — R4189 Other symptoms and signs involving cognitive functions and awareness: Secondary | ICD-10-CM | POA: Diagnosis not present

## 2017-10-06 DIAGNOSIS — G20 Parkinson's disease: Secondary | ICD-10-CM | POA: Diagnosis not present

## 2017-10-06 DIAGNOSIS — R278 Other lack of coordination: Secondary | ICD-10-CM | POA: Diagnosis not present

## 2017-10-06 DIAGNOSIS — Z9181 History of falling: Secondary | ICD-10-CM | POA: Diagnosis not present

## 2017-10-07 DIAGNOSIS — R4189 Other symptoms and signs involving cognitive functions and awareness: Secondary | ICD-10-CM | POA: Diagnosis not present

## 2017-10-07 DIAGNOSIS — Z741 Need for assistance with personal care: Secondary | ICD-10-CM | POA: Diagnosis not present

## 2017-10-07 DIAGNOSIS — G20 Parkinson's disease: Secondary | ICD-10-CM | POA: Diagnosis not present

## 2017-10-07 DIAGNOSIS — Z9181 History of falling: Secondary | ICD-10-CM | POA: Diagnosis not present

## 2017-10-07 DIAGNOSIS — R2681 Unsteadiness on feet: Secondary | ICD-10-CM | POA: Diagnosis not present

## 2017-10-07 DIAGNOSIS — R278 Other lack of coordination: Secondary | ICD-10-CM | POA: Diagnosis not present

## 2017-10-08 DIAGNOSIS — R4189 Other symptoms and signs involving cognitive functions and awareness: Secondary | ICD-10-CM | POA: Diagnosis not present

## 2017-10-08 DIAGNOSIS — G20 Parkinson's disease: Secondary | ICD-10-CM | POA: Diagnosis not present

## 2017-10-08 DIAGNOSIS — R278 Other lack of coordination: Secondary | ICD-10-CM | POA: Diagnosis not present

## 2017-10-08 DIAGNOSIS — Z9181 History of falling: Secondary | ICD-10-CM | POA: Diagnosis not present

## 2017-10-08 DIAGNOSIS — R2681 Unsteadiness on feet: Secondary | ICD-10-CM | POA: Diagnosis not present

## 2017-10-08 DIAGNOSIS — Z741 Need for assistance with personal care: Secondary | ICD-10-CM | POA: Diagnosis not present

## 2017-10-11 ENCOUNTER — Emergency Department
Admission: EM | Admit: 2017-10-11 | Discharge: 2017-10-11 | Disposition: A | Discharge status: Home / Self Care | Payer: Medicare Other | Attending: Emergency Medicine | Admitting: Emergency Medicine

## 2017-10-11 DIAGNOSIS — Z741 Need for assistance with personal care: Secondary | ICD-10-CM | POA: Diagnosis not present

## 2017-10-11 DIAGNOSIS — G20 Parkinson's disease: Secondary | ICD-10-CM | POA: Diagnosis not present

## 2017-10-11 DIAGNOSIS — I10 Essential (primary) hypertension: Secondary | ICD-10-CM | POA: Insufficient documentation

## 2017-10-11 DIAGNOSIS — R252 Cramp and spasm: Secondary | ICD-10-CM | POA: Diagnosis not present

## 2017-10-11 DIAGNOSIS — E785 Hyperlipidemia, unspecified: Secondary | ICD-10-CM | POA: Insufficient documentation

## 2017-10-11 DIAGNOSIS — R4189 Other symptoms and signs involving cognitive functions and awareness: Secondary | ICD-10-CM | POA: Diagnosis not present

## 2017-10-11 DIAGNOSIS — R278 Other lack of coordination: Secondary | ICD-10-CM | POA: Diagnosis not present

## 2017-10-11 DIAGNOSIS — M6281 Muscle weakness (generalized): Secondary | ICD-10-CM | POA: Diagnosis not present

## 2017-10-11 DIAGNOSIS — Z87891 Personal history of nicotine dependence: Secondary | ICD-10-CM | POA: Diagnosis not present

## 2017-10-11 DIAGNOSIS — R350 Frequency of micturition: Secondary | ICD-10-CM

## 2017-10-11 DIAGNOSIS — R2681 Unsteadiness on feet: Secondary | ICD-10-CM | POA: Diagnosis not present

## 2017-10-11 DIAGNOSIS — R531 Weakness: Secondary | ICD-10-CM

## 2017-10-11 DIAGNOSIS — Z79899 Other long term (current) drug therapy: Secondary | ICD-10-CM | POA: Diagnosis not present

## 2017-10-11 DIAGNOSIS — Z9181 History of falling: Secondary | ICD-10-CM | POA: Diagnosis not present

## 2017-10-11 LAB — URINALYSIS, COMPLETE (UACMP) WITH MICROSCOPIC
BACTERIA UA: NONE SEEN
BILIRUBIN URINE: NEGATIVE
Glucose, UA: NEGATIVE mg/dL
HGB URINE DIPSTICK: NEGATIVE
Ketones, ur: NEGATIVE mg/dL
LEUKOCYTES UA: NEGATIVE
NITRITE: NEGATIVE
PROTEIN: NEGATIVE mg/dL
Specific Gravity, Urine: 1.006 (ref 1.005–1.030)
Squamous Epithelial / LPF: NONE SEEN (ref 0–5)
WBC UA: NONE SEEN WBC/hpf (ref 0–5)
pH: 8 (ref 5.0–8.0)

## 2017-10-11 LAB — CBC WITH DIFFERENTIAL/PLATELET
BASOS ABS: 0 10*3/uL (ref 0–0.1)
BASOS PCT: 1 %
Eosinophils Absolute: 0.1 10*3/uL (ref 0–0.7)
Eosinophils Relative: 2 %
HEMATOCRIT: 44.1 % (ref 40.0–52.0)
HEMOGLOBIN: 15.3 g/dL (ref 13.0–18.0)
Lymphocytes Relative: 16 %
Lymphs Abs: 1.1 10*3/uL (ref 1.0–3.6)
MCH: 30.9 pg (ref 26.0–34.0)
MCHC: 34.7 g/dL (ref 32.0–36.0)
MCV: 89.1 fL (ref 80.0–100.0)
MONOS PCT: 6 %
Monocytes Absolute: 0.4 10*3/uL (ref 0.2–1.0)
NEUTROS ABS: 5.3 10*3/uL (ref 1.4–6.5)
NEUTROS PCT: 75 %
Platelets: 216 10*3/uL (ref 150–440)
RBC: 4.95 MIL/uL (ref 4.40–5.90)
RDW: 13.2 % (ref 11.5–14.5)
WBC: 7 10*3/uL (ref 3.8–10.6)

## 2017-10-11 LAB — BASIC METABOLIC PANEL
Anion gap: 9 (ref 5–15)
BUN: 16 mg/dL (ref 8–23)
CO2: 25 mmol/L (ref 22–32)
CREATININE: 0.87 mg/dL (ref 0.61–1.24)
Calcium: 8.5 mg/dL — ABNORMAL LOW (ref 8.9–10.3)
Chloride: 107 mmol/L (ref 98–111)
GFR calc non Af Amer: >60 mL/min (ref >60)
Glucose, Bld: 108 mg/dL — ABNORMAL HIGH (ref 70–99)
Potassium: 4.1 mmol/L (ref 3.5–5.1)
SODIUM: 141 mmol/L (ref 135–145)

## 2017-10-11 MED ORDER — LORAZEPAM 2 MG PO TABS
2.0000 mg | ORAL_TABLET | Freq: Once | ORAL | Status: AC
  Administered 2017-10-11: 2 mg via ORAL
  Filled 2017-10-11: qty 1

## 2017-10-11 MED ORDER — SODIUM CHLORIDE 0.9 % IV BOLUS
1000.0000 mL | Freq: Once | INTRAVENOUS | Status: AC
  Administered 2017-10-11: 1000 mL via INTRAVENOUS

## 2017-10-11 NOTE — ED Notes (Signed)
Pt arrived incontinent of urine; clothes and attend saturated; pt cleaned well; clean sheets in place as well as clean gown and warm blanket; pt appreciative;

## 2017-10-11 NOTE — ED Provider Notes (Addendum)
[facility] Emergency Department Provider Note   ____________________________________________   First MD Initiated Contact with Patient 10/11/17 0522     (approximate)  I have reviewed the triage vital signs and the nursing notes.   HISTORY  Chief Complaint Urinary Frequency    HPI Lucas Edwards is a 67 y.o. male who comes into the hospital today with some weakness and polyuria.  The patient is here from his assisted living facility.  He has a history of Parkinson's disease and for the past week he is been having some polyuria and feeling generalized weakness.  The patient denies any fever but he has been tachycardic.  He states that his heart rate is always a little bit above 100.  Per EMS the patient is covered in urine and he states that he is been trying to urinate in a bottle because he has been unable to make it to the bathroom.  The patient has some chronic back pain but denies pain with urination.  He said some chills but no chest pain.  He is here today for evaluation of his symptoms.   Past Medical History:  Diagnosis Date  . Abdominal hernia   . Chest pain    a. 2001 Cath:  nl cors per pt.  Marland Kitchen GERD (gastroesophageal reflux disease)   . History of hypertension   . Hyperlipidemia   . Parkinson disease [name])     Patient Active Problem List   Diagnosis Date Noted  . Hip fx ([place]) 08/05/2017  . Unstable angina ([place]) 05/14/2017  . Paralysis agitans ([place]) 10/24/2013  . 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 HIP ARTHROPLASTY Left 08/06/2017   Procedure: ARTHROPLASTY BIPOLAR HIP (HEMIARTHROPLASTY);  Surgeon: Corky Mull, MD;  Location: [facility] ORS;  Service: Orthopedics;  Laterality: Left;  . INGUINAL HERNIA REPAIR Bilateral   . VASECTOMY      Prior to Admission medications   Medication Sig Start Date End Date Taking? Authorizing Provider  bisacodyl (DULCOLAX) 5 MG EC tablet Take 1  tablet (5 mg total) by mouth daily as needed for moderate constipation. 08/09/17  Yes Vaughan Basta, MD  carbidopa-levodopa (SINEMET IR) 25-100 MG tablet Take 2 tablets by mouth. TAKE 2 TABLETS BY MOUTH SIX TIMES DAILY AND TAKE 1 ADDITIONAL TABLET BY MOUTH AT BEDTIME   Yes [provider]  docusate sodium (COLACE) 100 MG capsule Take 100 mg by mouth 4 (four) times daily.    Yes [provider]  donepezil (ARICEPT) 10 MG tablet Take 10 mg by mouth every evening.    Yes [provider]  entacapone (COMTAN) 200 MG tablet Take 200 mg by mouth 4 (four) times daily.   Yes [provider]  pantoprazole (PROTONIX) 40 MG tablet Take 40 mg by mouth daily.  10/21/13  Yes [provider]  traMADol (ULTRAM) 50 MG tablet Take 50 mg by mouth 2 (two) times daily as needed for pain.   Yes [provider]  enoxaparin (LOVENOX) 40 MG/0.4ML injection Inject 0.4 mLs (40 mg total) into the skin daily for 25 days. 08/10/17 09/04/17  Vaughan Basta, MD  oxyCODONE (OXY IR/ROXICODONE) 5 MG immediate release tablet Take 1 tablet (5 mg total) by mouth 2 (two) times daily as needed for severe pain. Patient not taking: Reported on 10/11/2017 08/09/17   Vaughan Basta, MD    Allergies Sulfa antibiotics and Tylenol [acetaminophen]  Family History  Problem Relation Age of  Onset  . Multiple sclerosis Mother        died @ 11  . Stroke Father        died @ 43  . Heart attack Father   . Lung cancer Sister        died in her 86's    Social History Social History   Tobacco Use  . Smoking status: Former Research scientist (life sciences)  . Smokeless tobacco: Never Used  . Tobacco comment: during high school/college  Substance Use Topics  . Alcohol use: Yes    Comment: rare - binges about once/yr or less.  . Drug use: No    Comment: marijuana occ    Review of Systems  Constitutional: chills Eyes: No visual changes. ENT: No sore throat. Cardiovascular: Denies chest  pain. Respiratory: Denies shortness of breath. Gastrointestinal: No abdominal pain.  No nausea, no vomiting.  No diarrhea.  No constipation. Genitourinary: Polyuria Musculoskeletal: Negative for back pain. Skin: Negative for rash. Neurological: Negative for headaches, focal weakness or numbness.   ____________________________________________   PHYSICAL EXAM:  VITAL SIGNS: ED Triage Vitals                     10/11/17  0528  Enc Vitals Group     BP 166/107     Pulse 107     Resp 18     Temp 98.6     Temp src      SpO2 95     Weight      Height      Head Circumference      Peak Flow      Pain Score      Pain Loc      Pain Edu?      Excl. in [place]?     Constitutional: Alert and oriented. Well appearing and in no acute distress.  Patient does have some resting tremors  Eyes: Conjunctivae are normal. PERRL. EOMI. Head: Atraumatic. Nose: No congestion/rhinnorhea. Mouth/Throat: Mucous membranes are moist.  Oropharynx non-erythematous. Cardiovascular: Tachycardia, regular rhythm. Grossly normal heart sounds.  Good peripheral circulation. Respiratory: Normal respiratory effort.  No retractions. Lungs CTAB. Gastrointestinal: Soft and nontender. No distention.  Positive bowel sounds Musculoskeletal: No lower extremity tenderness nor edema.   Neurologic:  Normal speech and language.  Skin:  Skin is warm, dry and intact.  Psychiatric: Mood and affect are normal.   ____________________________________________   LABS (all labs ordered are listed, but only abnormal results are displayed)  Labs Reviewed  URINALYSIS, COMPLETE (UACMP) WITH MICROSCOPIC - Abnormal; Notable for the following components:      Result Value   Color, Urine YELLOW (*)    APPearance CLEAR (*)    All other components within normal limits  BASIC METABOLIC PANEL - Abnormal; Notable for the following components:   Glucose, Bld 108 (*)    Calcium 8.5 (*)    All other components within normal limits  CBC WITH  DIFFERENTIAL/PLATELET   ____________________________________________  EKG  none ____________________________________________  RADIOLOGY  ED MD interpretation:  none  Official radiology report(s): No results found.  ____________________________________________   PROCEDURES  Procedure(s) performed: None  Procedures  Critical Care performed: No  ____________________________________________   INITIAL IMPRESSION / ASSESSMENT AND PLAN / ED COURSE  As part of my medical decision making, I reviewed the following data within the electronic MEDICAL RECORD NUMBER Notes from prior ED visits and  Controlled Substance Database   This is a 67 year old male who comes into the hospital  today with some polyuria and generalized weakness.  The patient has been urinating so much that he is urinating on himself.  Although the patient has been drinking water my concern is that the patient may have a urinary tract infection.  I will check a CBC, BMP and urinalysis on the patient.  He will be reassessed once I received his results.  The patient had a CBC, urinalysis and a BMP.  The patient's blood work is unremarkable.  His urinalysis is clear and does not show any sign of infection.  I will discharge the patient home to have him follow-up with his primary care physician.    I did go into the room to inform the patient of his results.  He started complaining that he was having leg cramps.  He states that his leg cramps have been really bad since 3 AM and he cannot tolerate them.  I explained to the patient that he did not initially have a primary complaint of leg cramps.  He states that when he takes his Parkinson's medicine his leg cramps improve and while I did encourage taking his Parkinson's medicine the patient then  stated that he was not sure if his Parkinson's medicine will help.  I did order the patient some Ativan 2 mg orally and I encouraged him to take his Parkinson's medicine.  I then  spoke to the patient's niece who states that the patient has been complaining of leg cramps worse than normal for the past 2 to 3 days.  He did have some left hip surgery and they feel that that could be contributing to the cramps.  I feel that the patient may have some restless leg syndrome and he needs to follow-up with his primary care physician and his neurologist to be placed on some Requip.  I do not feel comfortable giving the patient a prescription for Ativan for home since he lives at assisted living facility and is a fall risk with his Parkinson's.  The patient does have an appointment with his primary care physician and neurologist this week.  Once he receives his medications and his cramps have improved he will be discharged back to his nursing facility.      ____________________________________________   FINAL CLINICAL IMPRESSION(S) / ED DIAGNOSES  Final diagnoses:  Urinary frequency  Generalized weakness  Leg cramps     ED Discharge Orders    None       Note:  This document was prepared using Dragon voice recognition software and may include unintentional dictation errors.    Loney Hering, MD 10/11/17 7915    Loney Hering, MD 10/11/17 [phone]

## 2017-10-11 NOTE — ED Triage Notes (Signed)
Patient coming [facility] from [place] for increased urinary frequency, increased thirst, and bilateral leg cramps. Patient reports hx of recent traumatic fall and left hip fracture.

## 2017-10-11 NOTE — ED Notes (Signed)
Green top tube to lab as requested; urine specimen sent to lab;

## 2017-10-11 NOTE — Discharge Instructions (Addendum)
Your work-up today is unremarkable.  Please follow-up with your primary care physician for further evaluation of your symptoms.

## 2017-10-13 DIAGNOSIS — G20 Parkinson's disease: Secondary | ICD-10-CM | POA: Diagnosis not present

## 2017-10-13 DIAGNOSIS — R4189 Other symptoms and signs involving cognitive functions and awareness: Secondary | ICD-10-CM | POA: Diagnosis not present

## 2017-10-13 DIAGNOSIS — R2681 Unsteadiness on feet: Secondary | ICD-10-CM | POA: Diagnosis not present

## 2017-10-13 DIAGNOSIS — R278 Other lack of coordination: Secondary | ICD-10-CM | POA: Diagnosis not present

## 2017-10-13 DIAGNOSIS — Z741 Need for assistance with personal care: Secondary | ICD-10-CM | POA: Diagnosis not present

## 2017-10-13 DIAGNOSIS — Z9181 History of falling: Secondary | ICD-10-CM | POA: Diagnosis not present

## 2017-10-15 DIAGNOSIS — G20 Parkinson's disease: Secondary | ICD-10-CM | POA: Diagnosis not present

## 2017-10-15 DIAGNOSIS — Z9181 History of falling: Secondary | ICD-10-CM | POA: Diagnosis not present

## 2017-10-15 DIAGNOSIS — R4189 Other symptoms and signs involving cognitive functions and awareness: Secondary | ICD-10-CM | POA: Diagnosis not present

## 2017-10-15 DIAGNOSIS — R278 Other lack of coordination: Secondary | ICD-10-CM | POA: Diagnosis not present

## 2017-10-15 DIAGNOSIS — R2681 Unsteadiness on feet: Secondary | ICD-10-CM | POA: Diagnosis not present

## 2017-10-15 DIAGNOSIS — Z741 Need for assistance with personal care: Secondary | ICD-10-CM | POA: Diagnosis not present

## 2017-10-18 DIAGNOSIS — R4189 Other symptoms and signs involving cognitive functions and awareness: Secondary | ICD-10-CM | POA: Diagnosis not present

## 2017-10-18 DIAGNOSIS — Z741 Need for assistance with personal care: Secondary | ICD-10-CM | POA: Diagnosis not present

## 2017-10-18 DIAGNOSIS — R2681 Unsteadiness on feet: Secondary | ICD-10-CM | POA: Diagnosis not present

## 2017-10-18 DIAGNOSIS — G20 Parkinson's disease: Secondary | ICD-10-CM | POA: Diagnosis not present

## 2017-10-18 DIAGNOSIS — Z9181 History of falling: Secondary | ICD-10-CM | POA: Diagnosis not present

## 2017-10-18 DIAGNOSIS — R278 Other lack of coordination: Secondary | ICD-10-CM | POA: Diagnosis not present

## 2017-10-19 DIAGNOSIS — Z9181 History of falling: Secondary | ICD-10-CM | POA: Diagnosis not present

## 2017-10-19 DIAGNOSIS — Z741 Need for assistance with personal care: Secondary | ICD-10-CM | POA: Diagnosis not present

## 2017-10-19 DIAGNOSIS — R2681 Unsteadiness on feet: Secondary | ICD-10-CM | POA: Diagnosis not present

## 2017-10-19 DIAGNOSIS — G20 Parkinson's disease: Secondary | ICD-10-CM | POA: Diagnosis not present

## 2017-10-19 DIAGNOSIS — R278 Other lack of coordination: Secondary | ICD-10-CM | POA: Diagnosis not present

## 2017-10-19 DIAGNOSIS — R4189 Other symptoms and signs involving cognitive functions and awareness: Secondary | ICD-10-CM | POA: Diagnosis not present

## 2017-10-20 DIAGNOSIS — R2681 Unsteadiness on feet: Secondary | ICD-10-CM | POA: Diagnosis not present

## 2017-10-20 DIAGNOSIS — G20 Parkinson's disease: Secondary | ICD-10-CM | POA: Diagnosis not present

## 2017-10-20 DIAGNOSIS — Z741 Need for assistance with personal care: Secondary | ICD-10-CM | POA: Diagnosis not present

## 2017-10-20 DIAGNOSIS — R4189 Other symptoms and signs involving cognitive functions and awareness: Secondary | ICD-10-CM | POA: Diagnosis not present

## 2017-10-20 DIAGNOSIS — Z9181 History of falling: Secondary | ICD-10-CM | POA: Diagnosis not present

## 2017-10-20 DIAGNOSIS — R278 Other lack of coordination: Secondary | ICD-10-CM | POA: Diagnosis not present

## 2017-10-21 DIAGNOSIS — Z9181 History of falling: Secondary | ICD-10-CM | POA: Diagnosis not present

## 2017-10-21 DIAGNOSIS — Z741 Need for assistance with personal care: Secondary | ICD-10-CM | POA: Diagnosis not present

## 2017-10-21 DIAGNOSIS — R278 Other lack of coordination: Secondary | ICD-10-CM | POA: Diagnosis not present

## 2017-10-21 DIAGNOSIS — R2681 Unsteadiness on feet: Secondary | ICD-10-CM | POA: Diagnosis not present

## 2017-10-21 DIAGNOSIS — R4189 Other symptoms and signs involving cognitive functions and awareness: Secondary | ICD-10-CM | POA: Diagnosis not present

## 2017-10-21 DIAGNOSIS — G20 Parkinson's disease: Secondary | ICD-10-CM | POA: Diagnosis not present

## 2017-10-22 DIAGNOSIS — Z9181 History of falling: Secondary | ICD-10-CM | POA: Diagnosis not present

## 2017-10-22 DIAGNOSIS — Z741 Need for assistance with personal care: Secondary | ICD-10-CM | POA: Diagnosis not present

## 2017-10-22 DIAGNOSIS — Z8781 Personal history of (healed) traumatic fracture: Secondary | ICD-10-CM | POA: Diagnosis not present

## 2017-10-22 DIAGNOSIS — M25562 Pain in left knee: Secondary | ICD-10-CM | POA: Diagnosis not present

## 2017-10-22 DIAGNOSIS — R278 Other lack of coordination: Secondary | ICD-10-CM | POA: Diagnosis not present

## 2017-10-22 DIAGNOSIS — R2681 Unsteadiness on feet: Secondary | ICD-10-CM | POA: Diagnosis not present

## 2017-10-22 DIAGNOSIS — R4189 Other symptoms and signs involving cognitive functions and awareness: Secondary | ICD-10-CM | POA: Diagnosis not present

## 2017-10-22 DIAGNOSIS — G20 Parkinson's disease: Secondary | ICD-10-CM | POA: Diagnosis not present

## 2017-10-25 DIAGNOSIS — Z741 Need for assistance with personal care: Secondary | ICD-10-CM | POA: Diagnosis not present

## 2017-10-25 DIAGNOSIS — Z9181 History of falling: Secondary | ICD-10-CM | POA: Diagnosis not present

## 2017-10-25 DIAGNOSIS — R4189 Other symptoms and signs involving cognitive functions and awareness: Secondary | ICD-10-CM | POA: Diagnosis not present

## 2017-10-25 DIAGNOSIS — Z96649 Presence of unspecified artificial hip joint: Secondary | ICD-10-CM | POA: Insufficient documentation

## 2017-10-25 DIAGNOSIS — G20 Parkinson's disease: Secondary | ICD-10-CM | POA: Diagnosis not present

## 2017-10-25 DIAGNOSIS — R2681 Unsteadiness on feet: Secondary | ICD-10-CM | POA: Diagnosis not present

## 2017-10-25 DIAGNOSIS — R278 Other lack of coordination: Secondary | ICD-10-CM | POA: Diagnosis not present

## 2017-10-27 DIAGNOSIS — R2681 Unsteadiness on feet: Secondary | ICD-10-CM | POA: Diagnosis not present

## 2017-10-27 DIAGNOSIS — G20 Parkinson's disease: Secondary | ICD-10-CM | POA: Diagnosis not present

## 2017-10-27 DIAGNOSIS — R4189 Other symptoms and signs involving cognitive functions and awareness: Secondary | ICD-10-CM | POA: Diagnosis not present

## 2017-10-27 DIAGNOSIS — Z741 Need for assistance with personal care: Secondary | ICD-10-CM | POA: Diagnosis not present

## 2017-10-27 DIAGNOSIS — Z9181 History of falling: Secondary | ICD-10-CM | POA: Diagnosis not present

## 2017-10-27 DIAGNOSIS — R278 Other lack of coordination: Secondary | ICD-10-CM | POA: Diagnosis not present

## 2017-10-29 DIAGNOSIS — Z9181 History of falling: Secondary | ICD-10-CM | POA: Diagnosis not present

## 2017-10-29 DIAGNOSIS — R2681 Unsteadiness on feet: Secondary | ICD-10-CM | POA: Diagnosis not present

## 2017-10-29 DIAGNOSIS — R278 Other lack of coordination: Secondary | ICD-10-CM | POA: Diagnosis not present

## 2017-10-29 DIAGNOSIS — R4189 Other symptoms and signs involving cognitive functions and awareness: Secondary | ICD-10-CM | POA: Diagnosis not present

## 2017-11-01 ENCOUNTER — Encounter: Payer: Self-pay | Admitting: Emergency Medicine

## 2017-11-01 ENCOUNTER — Emergency Department
Admission: EM | Admit: 2017-11-01 | Discharge: 2017-11-01 | Disposition: A | Discharge status: Home / Self Care | Payer: Medicare Other | Attending: Emergency Medicine | Admitting: Emergency Medicine

## 2017-11-01 ENCOUNTER — Emergency Department: Payer: Medicare Other

## 2017-11-01 ENCOUNTER — Other Ambulatory Visit: Payer: Self-pay

## 2017-11-01 DIAGNOSIS — R1012 Left upper quadrant pain: Secondary | ICD-10-CM | POA: Diagnosis not present

## 2017-11-01 DIAGNOSIS — R0789 Other chest pain: Secondary | ICD-10-CM | POA: Diagnosis not present

## 2017-11-01 DIAGNOSIS — Z96642 Presence of left artificial hip joint: Secondary | ICD-10-CM | POA: Insufficient documentation

## 2017-11-01 DIAGNOSIS — Z87891 Personal history of nicotine dependence: Secondary | ICD-10-CM | POA: Insufficient documentation

## 2017-11-01 DIAGNOSIS — Z79899 Other long term (current) drug therapy: Secondary | ICD-10-CM | POA: Diagnosis not present

## 2017-11-01 DIAGNOSIS — G20 Parkinson's disease: Secondary | ICD-10-CM | POA: Insufficient documentation

## 2017-11-01 DIAGNOSIS — R079 Chest pain, unspecified: Secondary | ICD-10-CM

## 2017-11-01 DIAGNOSIS — I10 Essential (primary) hypertension: Secondary | ICD-10-CM | POA: Insufficient documentation

## 2017-11-01 LAB — CBC
HEMATOCRIT: 44 % (ref 40.0–52.0)
Hemoglobin: 15.3 g/dL (ref 13.0–18.0)
MCH: 30.9 pg (ref 26.0–34.0)
MCHC: 34.7 g/dL (ref 32.0–36.0)
MCV: 88.9 fL (ref 80.0–100.0)
Platelets: 199 10*3/uL (ref 150–440)
RBC: 4.95 MIL/uL (ref 4.40–5.90)
RDW: 14.1 % (ref 11.5–14.5)
WBC: 9.2 10*3/uL (ref 3.8–10.6)

## 2017-11-01 LAB — BASIC METABOLIC PANEL WITH GFR
Anion gap: 8 (ref 5–15)
BUN: 17 mg/dL (ref 8–23)
CO2: 29 mmol/L (ref 22–32)
Calcium: 9.1 mg/dL (ref 8.9–10.3)
Chloride: 102 mmol/L (ref 98–111)
Creatinine, Ser: 0.86 mg/dL (ref 0.61–1.24)
GFR calc Af Amer: >60 mL/min
GFR calc non Af Amer: >60 mL/min
Glucose, Bld: 120 mg/dL — ABNORMAL HIGH (ref 70–99)
Potassium: 4.1 mmol/L (ref 3.5–5.1)
Sodium: 139 mmol/L (ref 135–145)

## 2017-11-01 LAB — TROPONIN I: Troponin I: <0.03 ng/mL

## 2017-11-01 LAB — LACTIC ACID, PLASMA: Lactic Acid, Venous: 1.7 mmol/L (ref 0.5–1.9)

## 2017-11-01 MED ORDER — NAPROXEN 500 MG PO TABS: 500.0000 mg | ORAL_TABLET | Freq: Two times a day (BID) | ORAL | 2 refills | Status: DC

## 2017-11-01 MED ORDER — CARBIDOPA-LEVODOPA 25-100 MG PO TABS
2.0000 | ORAL_TABLET | Freq: Once | ORAL | Status: AC
  Administered 2017-11-01: 2 via ORAL
  Filled 2017-11-01: qty 2

## 2017-11-01 MED ORDER — IOPAMIDOL (ISOVUE-370) INJECTION 76%
75.0000 mL | Freq: Once | INTRAVENOUS | Status: AC | PRN
  Administered 2017-11-01: 75 mL via INTRAVENOUS

## 2017-11-01 NOTE — ED Provider Notes (Signed)
[facility] Emergency Department Provider Note   ____________________________________________   First MD Initiated Contact with Patient 11/01/17 0421     (approximate)  I have reviewed the triage vital signs and the nursing notes.   HISTORY  Chief Complaint Chest Pain  Level V caveat: Limited by dementia  HPI Lucas Edwards is a 67 y.o. male brought to the ED from [facility] assisted living via EMS with a chief complaint of chest pain.  Patient states yesterday he was on the commode and took him almost 30 minutes to get himself up by grabbing the side rail.  His chest has been sore since.  Complains of left-sided chest pain worse with movement and deep breathing.  Awoke at 2 AM to change his diaper and noted chest discomfort persisting and wanted to seek evaluation.  Denies recent fever, chills, cough, shortness of breath, abdominal pain, nausea or vomiting.  Denies recent travel or trauma.   Past Medical History:  Diagnosis Date  . Abdominal hernia   . Chest pain    a. 2001 Cath:  nl cors per pt.  Marland Kitchen GERD (gastroesophageal reflux disease)   . History of hypertension   . Hyperlipidemia   . Parkinson disease [facility])     Patient Active Problem List   Diagnosis Date Noted  . Hip fx ([place]) 08/05/2017  . Unstable angina ([place]) 05/14/2017  . Paralysis agitans ([place]) 10/24/2013  . 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 HIP ARTHROPLASTY Left 08/06/2017   Procedure: ARTHROPLASTY BIPOLAR HIP (HEMIARTHROPLASTY);  Surgeon: Corky Mull, MD;  Location: [facility] ORS;  Service: Orthopedics;  Laterality: Left;  . INGUINAL HERNIA REPAIR Bilateral   . VASECTOMY      Prior to Admission medications   Medication Sig Start Date End Date Taking? Authorizing Provider  alum & mag hydroxide-simeth (MAALOX/MYLANTA) 200-200-20 MG/5ML suspension Take 30 mLs by mouth as needed for indigestion or heartburn.   Yes [provider]  bismuth subsalicylate (PEPTO BISMOL) 262 MG/15ML suspension Take 30 mLs by mouth every 6 (six) hours as needed.   Yes [provider]  carbamide peroxide (DEBROX) 6.5 % OTIC solution Place 5 drops into both ears as needed.   Yes [provider]  carbidopa-levodopa (SINEMET IR) 25-100 MG tablet Take 2 tablets by mouth. TAKE 2 TABLETS BY MOUTH SIX TIMES DAILY AND TAKE 1 ADDITIONAL TABLET BY MOUTH AT BEDTIME   Yes [provider]  cetirizine (ZYRTEC) 10 MG tablet Take 10 mg by mouth at bedtime as needed for allergies.   Yes [provider]  dextromethorphan-guaiFENesin (ROBITUSSIN-DM) 10-100 MG/5ML liquid Take 10 mLs by mouth every 4 (four) hours as needed for cough.   Yes [provider]  docusate sodium (COLACE) 100 MG capsule Take 100 mg by mouth 4 (four) times daily.    Yes [provider]  donepezil (ARICEPT) 10 MG tablet Take 10 mg by mouth every evening.    Yes [provider]  entacapone (COMTAN) 200 MG tablet Take 200 mg by mouth 4 (four) times daily.   Yes [provider]  ibuprofen (ADVIL,MOTRIN) 200 MG tablet Take 200 mg by mouth every 6 (six) hours as needed.   Yes [provider]  magnesium hydroxide (MILK OF MAGNESIA) 800 MG/5ML suspension Take 30 mLs by mouth daily as needed for constipation.   Yes [provider]  nystatin (MYCOSTATIN/NYSTOP) powder Apply 1 Bottle topically 4 (four) times  daily.   Yes [provider]  ondansetron (ZOFRAN) 4 MG tablet Take 4 mg by mouth every 8 (eight) hours as needed for nausea or vomiting.   Yes [provider]  pantoprazole (PROTONIX) 40 MG tablet Take 40 mg by mouth daily.  10/21/13  Yes [provider]  traMADol (ULTRAM) 50 MG tablet Take 50 mg by mouth 2 (two) times daily as needed for pain.    [provider]    Allergies Lorazepam; Sulfa antibiotics; and Tylenol [acetaminophen]  Family History  Problem  Relation Age of Onset  . Multiple sclerosis Mother        died @ 44  . Stroke Father        died @ 11  . Heart attack Father   . Lung cancer Sister        died in her 85's    Social History Social History   Tobacco Use  . Smoking status: Former Research scientist (life sciences)  . Smokeless tobacco: Never Used  . Tobacco comment: during high school/college  Substance Use Topics  . Alcohol use: Yes    Comment: rare - binges about once/yr or less.  . Drug use: No    Comment: marijuana occ    Review of Systems  Constitutional: No fever/chills Eyes: No visual changes. ENT: No sore throat. Cardiovascular: Positive for chest pain. Respiratory: Denies shortness of breath. Gastrointestinal: No abdominal pain.  No nausea, no vomiting.  No diarrhea.  No constipation. Genitourinary: Negative for dysuria. Musculoskeletal: Negative for back pain. Skin: Negative for rash. Neurological: Negative for headaches, focal weakness or numbness.   ____________________________________________   PHYSICAL EXAM:  VITAL SIGNS: ED Triage Vitals  Enc Vitals Group     BP 11/01/17 0349 (!) 159/96     Pulse Rate 11/01/17 0349 93     Resp 11/01/17 0349 18     Temp 11/01/17 0349 98.2 F (36.8 C)     Temp Source 11/01/17 0349 Oral     SpO2 11/01/17 0349 97 %     Weight 11/01/17 0343 145 lb (65.8 kg)     Height 11/01/17 0343 5\' 9"  (1.753 m)     Head Circumference --      Peak Flow --      Pain Score 11/01/17 0342 10     Pain Loc --      Pain Edu? --      Excl. in [place]? --     Constitutional: Alert and oriented. Well appearing and in no acute distress. Eyes: Conjunctivae are normal. PERRL. EOMI. Head: Atraumatic. Nose: No congestion/rhinnorhea. Mouth/Throat: Mucous membranes are moist.  Oropharynx non-erythematous. Neck: No stridor.   Cardiovascular: Normal rate, regular rhythm. Grossly normal heart sounds.  Good peripheral circulation. Respiratory: Normal respiratory effort.  No retractions. Lungs mildly  diminished bibasilarly.  Anterior chest wall tender to palpation and with movement of trunk. Gastrointestinal: Soft and nontender. No distention. No abdominal bruits. No CVA tenderness. Musculoskeletal: No lower extremity tenderness nor edema.  No joint effusions. Neurologic:  Normal speech and language. No gross focal neurologic deficits are appreciated.  Skin:  Skin is warm, dry and intact. No rash noted. Psychiatric: Mood and affect are normal. Speech and behavior are normal.  ____________________________________________   LABS (all labs ordered are listed, but only abnormal results are displayed)  Labs Reviewed  BASIC METABOLIC PANEL - Abnormal; Notable for the following components:      Result Value   Glucose, Bld 120 (*)    All other  components within normal limits  CULTURE, BLOOD (ROUTINE X 2)  CULTURE, BLOOD (ROUTINE X 2)  CBC  TROPONIN I  LACTIC ACID, PLASMA  LACTIC ACID, PLASMA  TROPONIN I   ____________________________________________  EKG  ED ECG REPORT I, Safiyya Stokes J, the attending physician, personally viewed and interpreted this ECG.   Date: 11/01/2017  EKG Time: 0347  Rate: 91  Rhythm: normal EKG, normal sinus rhythm  Axis: Normal  Intervals:none  ST&T Change: Nonspecific  ____________________________________________  RADIOLOGY  ED MD interpretation: Pulmonary edema versus pneumonia on chest x-ray  Official radiology report(s): Dg Chest 1 View  Result Date: 11/01/2017 CLINICAL DATA:  67 y/o  M; chest pain. EXAM: CHEST  1 VIEW COMPARISON:  08/09/2017 chest radiograph. FINDINGS: Normal cardiac silhouette given projection and technique. Aortic atherosclerosis. Left-greater-than-right lung base patchy opacities. Possible small left effusion. No pneumothorax. Bones are unremarkable. IMPRESSION: Patchy lung base opacities greater on the left and possible small left effusion. Findings may represent pulmonary edema or pneumonia. Electronically Signed   By:  Kristine Garbe M.D.   On: 11/01/2017 04:22   Ct Angio Chest Pe W/cm &/or Wo Cm  Result Date: 11/01/2017 CLINICAL DATA:  Acute onset of mid chest pain. EXAM: CT ANGIOGRAPHY CHEST WITH CONTRAST TECHNIQUE: Multidetector CT imaging of the chest was performed using the standard protocol during bolus administration of intravenous contrast. Multiplanar CT image reconstructions and MIPs were obtained to evaluate the vascular anatomy. CONTRAST:  28mL ISOVUE-370 IOPAMIDOL (ISOVUE-370) INJECTION 76% COMPARISON:  Chest radiograph performed earlier today at 4:06 a.m. FINDINGS: Cardiovascular:  There is no evidence of pulmonary embolus. The heart is normal in size. The thoracic aorta is grossly unremarkable. The great vessels are within normal limits. Mediastinum/Nodes: The mediastinum is unremarkable in appearance. No mediastinal lymphadenopathy is seen. No pericardial effusion is identified. The visualized portions of the thyroid gland are unremarkable. No axillary lymphadenopathy is seen. Lungs/Pleura: Patchy bibasilar airspace opacities may reflect atelectasis or mild pneumonia. No pleural effusion or pneumothorax is seen. No masses are identified. Upper Abdomen: The visualized portions of the liver and spleen are unremarkable. Musculoskeletal: No acute osseous abnormalities are identified. An apparent bone island is noted at T3. The visualized musculature is unremarkable in appearance. Review of the MIP images confirms the above findings. IMPRESSION: 1. No evidence of pulmonary embolus. 2. Patchy bibasilar airspace opacities may reflect atelectasis or mild pneumonia. Electronically Signed   By: Garald Balding M.D.   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 Differential diagnosis includes, but is not limited to, ACS, aortic dissection, pulmonary embolism, cardiac tamponade, pneumothorax, pneumonia, pericarditis, myocarditis, GI-related causes including esophagitis/gastritis, and musculoskeletal chest wall pain.    Initial troponin unremarkable.  Chest x-ray suggestive of edema versus infection.  Patient does not give infectious history but is limited by dementia.  Will obtain blood cultures, lactate and proceed with CT chest to evaluate for PE.  Clinical Course as of Nov 02 714  Mon Nov 01, 2017  1696 CT negative for PE.  Again vascular congestion versus pneumonia.  Have added blood cultures and lactate to further delineate this.  Will repeat timed troponin after 7 AM.  Patient currently sleeping in no acute distress.   [JS]  7543 Patient resting in no acute distress.  Blood cultures, lactate and repeat troponin pending.  Patient is requesting his morning medications which I will order.  Care transferred to Dr. Corky Downs.  Anticipate discharge back to facility if lactate and troponin are unremarkable.  Of note, I verified with the patient that he has not had any fevers or coughing or other infectious symptomatology.   [JS]    Clinical Course User Index [JS] Paulette Blanch, MD     ____________________________________________   FINAL CLINICAL IMPRESSION(S) / ED DIAGNOSES  Final diagnoses:  Chest pain, unspecified type  Chest wall pain     ED Discharge Orders    None       Note:  This document was prepared using Dragon voice recognition software and may include unintentional dictation  errors.    Paulette Blanch, MD 11/01/17 [id number]

## 2017-11-01 NOTE — ED Notes (Signed)
Patients brief changed and full linen changed due to urinary incontinence.

## 2017-11-01 NOTE — ED Notes (Signed)
Called security at [facility]. He states "I am trying to find the number for transportation now. Can I have your number and will call you back about patient"

## 2017-11-01 NOTE — ED Notes (Addendum)
Attempted to call [facility]. Waiting on staff to call back to give report and arrange transportation

## 2017-11-01 NOTE — ED Notes (Signed)
Patients brief changed and peri care performed for d/c

## 2017-11-01 NOTE — ED Provider Notes (Signed)
Asked to follow-up on labs by Dr. Beather Arbour and if unremarkable okay for discharge.  Patient's lactic acid is normal, appropriate for discharge at this time.  Will start on anti-inflammatory for pleurisy   Lavonia Drafts, MD 11/01/17 [id number]

## 2017-11-01 NOTE — ED Triage Notes (Signed)
Pt arrives from [facility] assisted living facility c/o mid chest pain which started "when I got stuck on the toilet yesterday" but went away and came back about 0200 this morning. Pt states that it hurts the worst when he takes a deep breath. Pt is in NAD.

## 2017-11-01 NOTE — Discharge Instructions (Signed)
Continue all medications as directed by your doctor.  Return to the ER for worsening symptoms, persistent vomiting, difficulty breathing or other concerns. °

## 2017-11-01 NOTE — ED Notes (Signed)
Pt on phone with niece stephanie at this time

## 2017-11-02 DIAGNOSIS — R278 Other lack of coordination: Secondary | ICD-10-CM | POA: Diagnosis not present

## 2017-11-02 DIAGNOSIS — Z9181 History of falling: Secondary | ICD-10-CM | POA: Diagnosis not present

## 2017-11-02 DIAGNOSIS — R2681 Unsteadiness on feet: Secondary | ICD-10-CM | POA: Diagnosis not present

## 2017-11-02 DIAGNOSIS — R4189 Other symptoms and signs involving cognitive functions and awareness: Secondary | ICD-10-CM | POA: Diagnosis not present

## 2017-11-02 DIAGNOSIS — Z0000 Encounter for general adult medical examination without abnormal findings: Secondary | ICD-10-CM | POA: Diagnosis not present

## 2017-11-03 DIAGNOSIS — G4711 Idiopathic hypersomnia with long sleep time: Secondary | ICD-10-CM | POA: Diagnosis not present

## 2017-11-03 DIAGNOSIS — G20 Parkinson's disease: Secondary | ICD-10-CM | POA: Diagnosis not present

## 2017-11-03 DIAGNOSIS — R413 Other amnesia: Secondary | ICD-10-CM | POA: Diagnosis not present

## 2017-11-03 DIAGNOSIS — R278 Other lack of coordination: Secondary | ICD-10-CM | POA: Diagnosis not present

## 2017-11-03 DIAGNOSIS — R4189 Other symptoms and signs involving cognitive functions and awareness: Secondary | ICD-10-CM | POA: Diagnosis not present

## 2017-11-03 DIAGNOSIS — G4752 REM sleep behavior disorder: Secondary | ICD-10-CM | POA: Diagnosis not present

## 2017-11-03 DIAGNOSIS — Z9181 History of falling: Secondary | ICD-10-CM | POA: Diagnosis not present

## 2017-11-03 DIAGNOSIS — R2681 Unsteadiness on feet: Secondary | ICD-10-CM | POA: Diagnosis not present

## 2017-11-03 DIAGNOSIS — R42 Dizziness and giddiness: Secondary | ICD-10-CM | POA: Diagnosis not present

## 2017-11-04 ENCOUNTER — Encounter: Payer: Self-pay | Admitting: Internal Medicine

## 2017-11-04 DIAGNOSIS — R2681 Unsteadiness on feet: Secondary | ICD-10-CM | POA: Diagnosis not present

## 2017-11-04 DIAGNOSIS — F0280 Dementia in other diseases classified elsewhere without behavioral disturbance: Secondary | ICD-10-CM | POA: Insufficient documentation

## 2017-11-04 DIAGNOSIS — R32 Unspecified urinary incontinence: Secondary | ICD-10-CM | POA: Insufficient documentation

## 2017-11-04 DIAGNOSIS — R4189 Other symptoms and signs involving cognitive functions and awareness: Secondary | ICD-10-CM | POA: Diagnosis not present

## 2017-11-04 DIAGNOSIS — Z9181 History of falling: Secondary | ICD-10-CM | POA: Diagnosis not present

## 2017-11-04 DIAGNOSIS — G20 Parkinson's disease: Secondary | ICD-10-CM | POA: Insufficient documentation

## 2017-11-04 DIAGNOSIS — I10 Essential (primary) hypertension: Secondary | ICD-10-CM | POA: Insufficient documentation

## 2017-11-04 DIAGNOSIS — R278 Other lack of coordination: Secondary | ICD-10-CM | POA: Diagnosis not present

## 2017-11-05 DIAGNOSIS — Z9181 History of falling: Secondary | ICD-10-CM | POA: Diagnosis not present

## 2017-11-05 DIAGNOSIS — R278 Other lack of coordination: Secondary | ICD-10-CM | POA: Diagnosis not present

## 2017-11-05 DIAGNOSIS — R4189 Other symptoms and signs involving cognitive functions and awareness: Secondary | ICD-10-CM | POA: Diagnosis not present

## 2017-11-05 DIAGNOSIS — R2681 Unsteadiness on feet: Secondary | ICD-10-CM | POA: Diagnosis not present

## 2017-11-06 LAB — CULTURE, BLOOD (ROUTINE X 2)
CULTURE: NO GROWTH
CULTURE: NO GROWTH
SPECIAL REQUESTS: ADEQUATE
Special Requests: ADEQUATE

## 2017-11-08 DIAGNOSIS — R4189 Other symptoms and signs involving cognitive functions and awareness: Secondary | ICD-10-CM | POA: Diagnosis not present

## 2017-11-08 DIAGNOSIS — Z9181 History of falling: Secondary | ICD-10-CM | POA: Diagnosis not present

## 2017-11-08 DIAGNOSIS — R2681 Unsteadiness on feet: Secondary | ICD-10-CM | POA: Diagnosis not present

## 2017-11-08 DIAGNOSIS — R278 Other lack of coordination: Secondary | ICD-10-CM | POA: Diagnosis not present

## 2017-11-10 DIAGNOSIS — R2681 Unsteadiness on feet: Secondary | ICD-10-CM | POA: Diagnosis not present

## 2017-11-10 DIAGNOSIS — R278 Other lack of coordination: Secondary | ICD-10-CM | POA: Diagnosis not present

## 2017-11-10 DIAGNOSIS — Z9181 History of falling: Secondary | ICD-10-CM | POA: Diagnosis not present

## 2017-11-10 DIAGNOSIS — R4189 Other symptoms and signs involving cognitive functions and awareness: Secondary | ICD-10-CM | POA: Diagnosis not present

## 2017-11-11 DIAGNOSIS — Z9181 History of falling: Secondary | ICD-10-CM | POA: Diagnosis not present

## 2017-11-11 DIAGNOSIS — S76012A Strain of muscle, fascia and tendon of left hip, initial encounter: Secondary | ICD-10-CM | POA: Diagnosis not present

## 2017-11-11 DIAGNOSIS — Z96649 Presence of unspecified artificial hip joint: Secondary | ICD-10-CM | POA: Diagnosis not present

## 2017-11-11 DIAGNOSIS — M791 Myalgia, unspecified site: Secondary | ICD-10-CM | POA: Diagnosis not present

## 2017-11-11 DIAGNOSIS — M25552 Pain in left hip: Secondary | ICD-10-CM | POA: Diagnosis not present

## 2017-11-11 DIAGNOSIS — S3992XA Unspecified injury of lower back, initial encounter: Secondary | ICD-10-CM | POA: Diagnosis not present

## 2017-11-12 DIAGNOSIS — R2681 Unsteadiness on feet: Secondary | ICD-10-CM | POA: Diagnosis not present

## 2017-11-12 DIAGNOSIS — R4189 Other symptoms and signs involving cognitive functions and awareness: Secondary | ICD-10-CM | POA: Diagnosis not present

## 2017-11-12 DIAGNOSIS — R278 Other lack of coordination: Secondary | ICD-10-CM | POA: Diagnosis not present

## 2017-11-12 DIAGNOSIS — Z9181 History of falling: Secondary | ICD-10-CM | POA: Diagnosis not present

## 2017-11-15 DIAGNOSIS — R4189 Other symptoms and signs involving cognitive functions and awareness: Secondary | ICD-10-CM | POA: Diagnosis not present

## 2017-11-15 DIAGNOSIS — R2681 Unsteadiness on feet: Secondary | ICD-10-CM | POA: Diagnosis not present

## 2017-11-15 DIAGNOSIS — Z9181 History of falling: Secondary | ICD-10-CM | POA: Diagnosis not present

## 2017-11-15 DIAGNOSIS — R278 Other lack of coordination: Secondary | ICD-10-CM | POA: Diagnosis not present

## 2017-11-19 DIAGNOSIS — Z9181 History of falling: Secondary | ICD-10-CM | POA: Diagnosis not present

## 2017-11-19 DIAGNOSIS — R278 Other lack of coordination: Secondary | ICD-10-CM | POA: Diagnosis not present

## 2017-11-19 DIAGNOSIS — R2681 Unsteadiness on feet: Secondary | ICD-10-CM | POA: Diagnosis not present

## 2017-11-19 DIAGNOSIS — R4189 Other symptoms and signs involving cognitive functions and awareness: Secondary | ICD-10-CM | POA: Diagnosis not present

## 2017-11-25 ENCOUNTER — Ambulatory Visit: Payer: Medicare Other | Admitting: Internal Medicine

## 2017-11-25 ENCOUNTER — Encounter: Payer: Self-pay | Admitting: Internal Medicine

## 2017-11-25 VITALS — BP 90/57 | HR 72 | Temp 97.6°F | Resp 20 | Wt 167.4 lb

## 2017-11-25 DIAGNOSIS — G20 Parkinson's disease: Secondary | ICD-10-CM

## 2017-11-25 DIAGNOSIS — F0280 Dementia in other diseases classified elsewhere without behavioral disturbance: Secondary | ICD-10-CM | POA: Diagnosis not present

## 2017-11-25 DIAGNOSIS — I10 Essential (primary) hypertension: Secondary | ICD-10-CM | POA: Diagnosis not present

## 2017-11-25 DIAGNOSIS — L578 Other skin changes due to chronic exposure to nonionizing radiation: Secondary | ICD-10-CM | POA: Diagnosis not present

## 2017-11-25 DIAGNOSIS — R32 Unspecified urinary incontinence: Secondary | ICD-10-CM | POA: Diagnosis not present

## 2017-11-25 DIAGNOSIS — L570 Actinic keratosis: Secondary | ICD-10-CM | POA: Diagnosis not present

## 2017-11-25 NOTE — Assessment & Plan Note (Signed)
Has rigged up a system with the condom catheter to keep if from kinking Keeps him dry at night

## 2017-11-25 NOTE — Assessment & Plan Note (Signed)
BP Readings from Last 3 Encounters:  11/25/17 (!) 90/57  11/01/17 135/86  10/11/17 124/72   Runs low now No clear orthostatic symptoms though

## 2017-11-25 NOTE — Progress Notes (Signed)
Subjective:    Patient ID: Lucas Edwards, male    DOB: [birth year], 67 y.o.   MRN: [id number]  HPI Visit in [place] apartment to establish care here I did see him in rehab after his hip fracture Switching from Dr Paula Compton Reviewed status with [name] RN--no new concerns  Ongoing Parkinson's  Stable sinemet regimen Notes stiffness in neck more lately (but also in legs) Continues to follow with Dr Melrose Nakayama  Left hip is still very sensitive Walks with rollator most of the time--but can walk without it (forgets it) Has had another fall though---discussed safety  Mild confusion only Is able to manage his own care Needs assistance with showering and dressing Independent with bathroom but ongoing incontinence--uses condom catheter at night  No chest pain recently No known CAD though No SOB  Current Outpatient Medications on File Prior to Visit  Medication Sig Dispense Refill  . carbidopa-levodopa (SINEMET IR) 25-100 MG tablet Take 2 tablets by mouth. TAKE 2 TABLETS BY MOUTH SIX TIMES DAILY AND TAKE 1 ADDITIONAL TABLET BY MOUTH AT BEDTIME    . cetirizine (ZYRTEC) 10 MG tablet Take 10 mg by mouth at bedtime as needed for allergies.    Marland Kitchen dextromethorphan-guaiFENesin (ROBITUSSIN-DM) 10-100 MG/5ML liquid Take 10 mLs by mouth every 4 (four) hours as needed for cough.    . docusate sodium (COLACE) 100 MG capsule Take 100 mg by mouth 4 (four) times daily.     Marland Kitchen donepezil (ARICEPT) 10 MG tablet Take 10 mg by mouth every evening.     . entacapone (COMTAN) 200 MG tablet Take 200 mg by mouth 4 (four) times daily.    Marland Kitchen ibuprofen (ADVIL,MOTRIN) 200 MG tablet Take 200 mg by mouth every 6 (six) hours as needed.    . nystatin (MYCOSTATIN/NYSTOP) powder Apply 1 Bottle topically 4 (four) times daily.    . ondansetron (ZOFRAN) 4 MG tablet Take 4 mg by mouth every 8 (eight) hours as needed for nausea or vomiting.    . pantoprazole (PROTONIX) 40 MG tablet Take 40 mg by mouth daily.      No current  facility-administered medications on file prior to visit.     Allergies  Allergen Reactions  . Lorazepam Other (See Comments)    confusion  . Sulfa Antibiotics Hives  . Tylenol [Acetaminophen] Itching    Past Medical History:  Diagnosis Date  . Abdominal hernia   . 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[place])   . Essential hypertension, benign   . GERD (gastroesophageal reflux disease)   . Hyperlipidemia   . Parkinson disease ([place])   . Urinary incontinence     Past Surgical History:  Procedure Laterality Date  . HIP ARTHROPLASTY Left 08/06/2017   Procedure: ARTHROPLASTY BIPOLAR HIP (HEMIARTHROPLASTY);  Surgeon: Corky Mull, MD;  Location: [facility] ORS;  Service: Orthopedics;  Laterality: Left;  . INGUINAL HERNIA REPAIR Bilateral   . VASECTOMY      Family History  Problem Relation Age of Onset  . Multiple sclerosis Mother        died @ 33  . Stroke Father        died @ 33  . Heart attack Father   . Lung cancer Sister        died in her 59's    Social History   Socioeconomic History  . Marital status: Widowed    Spouse name: Not on file  . Number of children: 1  . Years of education: Not on file  .  Highest education level: Not on file  Occupational History  . Occupation: Clinical cytogeneticist    Comment: Disabled/retired  Social Needs  . Financial resource strain: Not on file  . Food insecurity:    Worry: Not on file    Inability: Not on file  . Transportation needs:    Medical: Not on file    Non-medical: Not on file  Tobacco Use  . Smoking status: Former Research scientist (life sciences)  . Smokeless tobacco: Never Used  . Tobacco comment: during high school/college  Substance and Sexual Activity  . Alcohol use: Yes    Comment: rare - binges about once/yr or less.  . Drug use: No    Comment: marijuana occ  . Sexual activity: Never  Lifestyle  . Physical activity:    Days per week: Not on file    Minutes per session: Not on file  . Stress: Not on file  Relationships  .  Social connections:    Talks on phone: Not on file    Gets together: Not on file    Attends religious service: Not on file    Active member of club or organization: Not on file    Attends meetings of clubs or organizations: Not on file    Relationship status: Not on file  . Intimate partner violence:    Fear of current or ex partner: Not on file    Emotionally abused: Not on file    Physically abused: Not on file    Forced sexual activity: Not on file  Other Topics Concern  . Not on file  Social History Narrative   Divorced then widowed   1 stepdaughter      Has living will   Niece [name] is health care POA   Has DNR   Would accept hospital   No extended tube feedings   Review of Systems Sleeps well--"hard" Appetite is good--has put on quite a lot of weight (up 20# or so) No edema--except at the end of the day. Avoids salt Bowels are slow--even with the docusate. Uses miralax at times    Objective:   Physical Exam  Constitutional: He appears well-developed. No distress.  Neck: No thyromegaly present.  Cardiovascular: Normal rate, regular rhythm and normal heart sounds. Exam reveals no gallop.  No murmur heard. Respiratory: Effort normal and breath sounds normal. No respiratory distress. He has no wheezes. He has no rales.  GI: Soft. There is no tenderness.  Musculoskeletal:  No sig edema now  Lymphadenopathy:    He has no cervical adenopathy.  Neurological:  No sig tremor, only mild stiffness (just had medication)  Psychiatric: He has a normal mood and affect. His behavior is normal.           Assessment & Plan:

## 2017-11-25 NOTE — Assessment & Plan Note (Signed)
Has noticed some problems at the end of his dosing interval No striking functional problems Will add miralax for constipation

## 2017-11-25 NOTE — Assessment & Plan Note (Signed)
Mild Doing okay with some functional assistance here in the AL facility

## 2017-12-17 DIAGNOSIS — Z96649 Presence of unspecified artificial hip joint: Secondary | ICD-10-CM | POA: Diagnosis not present

## 2017-12-17 DIAGNOSIS — M7062 Trochanteric bursitis, left hip: Secondary | ICD-10-CM | POA: Diagnosis not present

## 2017-12-29 DIAGNOSIS — H2513 Age-related nuclear cataract, bilateral: Secondary | ICD-10-CM | POA: Diagnosis not present

## 2018-01-03 DIAGNOSIS — G20 Parkinson's disease: Secondary | ICD-10-CM | POA: Diagnosis not present

## 2018-01-03 DIAGNOSIS — R413 Other amnesia: Secondary | ICD-10-CM | POA: Diagnosis not present

## 2018-01-03 DIAGNOSIS — G4711 Idiopathic hypersomnia with long sleep time: Secondary | ICD-10-CM | POA: Diagnosis not present

## 2018-01-03 DIAGNOSIS — G4752 REM sleep behavior disorder: Secondary | ICD-10-CM | POA: Diagnosis not present

## 2018-02-03 DIAGNOSIS — L570 Actinic keratosis: Secondary | ICD-10-CM | POA: Diagnosis not present

## 2018-02-13 ENCOUNTER — Emergency Department
Admission: EM | Admit: 2018-02-13 | Discharge: 2018-02-13 | Disposition: A | Discharge status: Home / Self Care | Payer: Medicare Other | Attending: Emergency Medicine | Admitting: Emergency Medicine

## 2018-02-13 ENCOUNTER — Emergency Department: Payer: Medicare Other

## 2018-02-13 ENCOUNTER — Other Ambulatory Visit: Payer: Self-pay

## 2018-02-13 DIAGNOSIS — G20 Parkinson's disease: Secondary | ICD-10-CM | POA: Insufficient documentation

## 2018-02-13 DIAGNOSIS — E785 Hyperlipidemia, unspecified: Secondary | ICD-10-CM | POA: Insufficient documentation

## 2018-02-13 DIAGNOSIS — Z79899 Other long term (current) drug therapy: Secondary | ICD-10-CM | POA: Diagnosis not present

## 2018-02-13 DIAGNOSIS — F0390 Unspecified dementia without behavioral disturbance: Secondary | ICD-10-CM | POA: Insufficient documentation

## 2018-02-13 DIAGNOSIS — Z87891 Personal history of nicotine dependence: Secondary | ICD-10-CM | POA: Insufficient documentation

## 2018-02-13 DIAGNOSIS — R4182 Altered mental status, unspecified: Secondary | ICD-10-CM | POA: Insufficient documentation

## 2018-02-13 DIAGNOSIS — R140 Abdominal distension (gaseous): Secondary | ICD-10-CM | POA: Diagnosis not present

## 2018-02-13 DIAGNOSIS — I10 Essential (primary) hypertension: Secondary | ICD-10-CM | POA: Diagnosis not present

## 2018-02-13 DIAGNOSIS — R4020 Unspecified coma: Secondary | ICD-10-CM | POA: Diagnosis not present

## 2018-02-13 DIAGNOSIS — R109 Unspecified abdominal pain: Secondary | ICD-10-CM | POA: Diagnosis not present

## 2018-02-13 DIAGNOSIS — M6281 Muscle weakness (generalized): Secondary | ICD-10-CM | POA: Diagnosis not present

## 2018-02-13 DIAGNOSIS — Z7401 Bed confinement status: Secondary | ICD-10-CM | POA: Diagnosis not present

## 2018-02-13 LAB — CBC WITH DIFFERENTIAL/PLATELET
ABS IMMATURE GRANULOCYTES: 0.04 10*3/uL (ref 0.00–0.07)
Basophils Absolute: 0.1 10*3/uL (ref 0.0–0.1)
Basophils Relative: 1 %
Eosinophils Absolute: 0.1 10*3/uL (ref 0.0–0.5)
Eosinophils Relative: 2 %
HCT: 44.5 % (ref 39.0–52.0)
HEMOGLOBIN: 14.8 g/dL (ref 13.0–17.0)
IMMATURE GRANULOCYTES: 1 %
LYMPHS ABS: 1 10*3/uL (ref 0.7–4.0)
LYMPHS PCT: 16 %
MCH: 30.1 pg (ref 26.0–34.0)
MCHC: 33.3 g/dL (ref 30.0–36.0)
MCV: 90.6 fL (ref 80.0–100.0)
MONO ABS: 0.5 10*3/uL (ref 0.1–1.0)
MONOS PCT: 8 %
NEUTROS PCT: 72 %
Neutro Abs: 4.4 10*3/uL (ref 1.7–7.7)
Platelets: 188 10*3/uL (ref 150–400)
RBC: 4.91 MIL/uL (ref 4.22–5.81)
RDW: 13.3 % (ref 11.5–15.5)
WBC: 6.1 10*3/uL (ref 4.0–10.5)
nRBC: 0 % (ref 0.0–0.2)

## 2018-02-13 LAB — BASIC METABOLIC PANEL
ANION GAP: 6 (ref 5–15)
BUN: 16 mg/dL (ref 8–23)
CHLORIDE: 105 mmol/L (ref 98–111)
CO2: 28 mmol/L (ref 22–32)
Calcium: 8.7 mg/dL — ABNORMAL LOW (ref 8.9–10.3)
Creatinine, Ser: 0.93 mg/dL (ref 0.61–1.24)
GFR calc Af Amer: >60 mL/min (ref >60)
GFR calc non Af Amer: >60 mL/min (ref >60)
GLUCOSE: 89 mg/dL (ref 70–99)
POTASSIUM: 3.7 mmol/L (ref 3.5–5.1)
Sodium: 139 mmol/L (ref 135–145)

## 2018-02-13 LAB — URINALYSIS, COMPLETE (UACMP) WITH MICROSCOPIC
BACTERIA UA: NONE SEEN
Bilirubin Urine: NEGATIVE
GLUCOSE, UA: NEGATIVE mg/dL
HGB URINE DIPSTICK: NEGATIVE
KETONES UR: NEGATIVE mg/dL
LEUKOCYTES UA: NEGATIVE
Nitrite: NEGATIVE
PROTEIN: NEGATIVE mg/dL
Specific Gravity, Urine: 1.001 — ABNORMAL LOW (ref 1.005–1.030)
Squamous Epithelial / LPF: NONE SEEN (ref 0–5)
WBC UA: NONE SEEN WBC/hpf (ref 0–5)
pH: 7 (ref 5.0–8.0)

## 2018-02-13 NOTE — ED Provider Notes (Signed)
[facility] Emergency Department Provider Note  ____________________________________________   I have reviewed the triage vital signs and the nursing notes.   HISTORY  Chief Complaint Altered Mental Status   History limited by: Not Limited   HPI Lucas Edwards is a 67 y.o. male who presents to the emergency department today via EMS from living facility because of concern for an episode of unresponsiveness. The patient states that he took a nap this afternoon. He then was having a difficult time moving when they came and checked on him. He states that it is not unusual for him to have an episode where he might have difficulty moving parts of his body with his parkinson's but this episode felt worse. His only other complaint is for some abdominal discomfort and bloating. He does think it might be related to having a difficult time swallowing.    Per medical record review patient has a history of Parkinson's disease.   Past Medical History:  Diagnosis Date  . Abdominal hernia   . 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 Essential hypertension, benign   . GERD (gastroesophageal reflux disease)   . Hyperlipidemia   . Parkinson disease ([place])   . Urinary incontinence     Patient Active Problem List   Diagnosis Date Noted  . Essential hypertension, benign   . Parkinson disease ([place])   . 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 Urinary incontinence   . 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 HIP ARTHROPLASTY Left 08/06/2017   Procedure: ARTHROPLASTY BIPOLAR HIP (HEMIARTHROPLASTY);  Surgeon: Corky Mull, MD;  Location: [facility] ORS;  Service: Orthopedics;  Laterality: Left;  . INGUINAL HERNIA REPAIR Bilateral   . VASECTOMY      Prior to Admission medications   Medication Sig Start Date End Date Taking? Authorizing Provider  carbidopa-levodopa (SINEMET IR) 25-100 MG tablet  Take 2 tablets by mouth. TAKE 2 TABLETS BY MOUTH SIX TIMES DAILY AND TAKE 1 ADDITIONAL TABLET BY MOUTH AT BEDTIME    [provider]  cetirizine (ZYRTEC) 10 MG tablet Take 10 mg by mouth at bedtime as needed for allergies.    [provider]  dextromethorphan-guaiFENesin (ROBITUSSIN-DM) 10-100 MG/5ML liquid Take 10 mLs by mouth every 4 (four) hours as needed for cough.    [provider]  docusate sodium (COLACE) 100 MG capsule Take 100 mg by mouth 4 (four) times daily.     [provider]  donepezil (ARICEPT) 10 MG tablet Take 10 mg by mouth every evening.     [provider]  entacapone (COMTAN) 200 MG tablet Take 200 mg by mouth 4 (four) times daily.    [provider]  ibuprofen (ADVIL,MOTRIN) 200 MG tablet Take 200 mg by mouth every 6 (six) hours as needed.    [provider]  nystatin (MYCOSTATIN/NYSTOP) powder Apply 1 Bottle topically 4 (four) times daily.    [provider]  ondansetron (ZOFRAN) 4 MG tablet Take 4 mg by mouth every 8 (eight) hours as needed for nausea or vomiting.    [provider]  pantoprazole (PROTONIX) 40 MG tablet Take 40 mg by mouth daily.  10/21/13   [provider]  polyethylene glycol (MIRALAX / GLYCOLAX) packet Take 17 g by mouth every other day.    [provider]    Allergies Lorazepam; Sulfa antibiotics; and Tylenol [acetaminophen]  Family History  Problem Relation Age  of Onset  . Multiple sclerosis Mother        died @ 68  . Stroke Father        died @ 42  . Heart attack Father   . Lung cancer Sister        died in her 1's    Social History Social History   Tobacco Use  . Smoking status: Former Research scientist (life sciences)  . Smokeless tobacco: Never Used  . Tobacco comment: during high school/college  Substance Use Topics  . Alcohol use: Yes    Comment: whenever he gets the chance (once every few months)  . Drug use: No    Comment: marijuana occ    Review of  Systems Constitutional: No fever/chills Eyes: No visual changes. ENT: No sore throat. Cardiovascular: Denies chest pain. Respiratory: Denies shortness of breath. Gastrointestinal: Positive for abdominal pain, distention. Genitourinary: Negative for dysuria. Musculoskeletal: Negative for back pain. Skin: Negative for rash. Neurological: Negative for headaches, focal weakness or numbness.  ____________________________________________   PHYSICAL EXAM:  VITAL SIGNS: ED Triage Vitals  Enc Vitals Group     BP 02/13/18 1658 (!) 150/76     Pulse Rate 02/13/18 1658 88     Resp 02/13/18 1658 16     Temp 02/13/18 1658 98 F (36.7 C)     Temp Source 02/13/18 1658 Oral     SpO2 02/13/18 1658 97 %     Weight 02/13/18 1659 170 lb (77.1 kg)     Height 02/13/18 1659 5\' 10"  (1.778 m)     Head Circumference --      Peak Flow --      Pain Score 02/13/18 1659 4    Constitutional: Alert and oriented.  Eyes: Conjunctivae are normal.  ENT      Head: Normocephalic and atraumatic.      Nose: No congestion/rhinnorhea.      Mouth/Throat: Mucous membranes are moist.      Neck: No stridor. Hematological/Lymphatic/Immunilogical: No cervical lymphadenopathy. Cardiovascular: Normal rate, regular rhythm.  No murmurs, rubs, or gallops.  Respiratory: Normal respiratory effort without tachypnea nor retractions. Breath sounds are clear and equal bilaterally. No wheezes/rales/rhonchi. Gastrointestinal: Soft and non tender. No rebound. No guarding.  Genitourinary: Deferred Musculoskeletal: Normal range of motion in all extremities. No lower extremity edema. Neurologic:  Moving all extremities albeit slowly. Awake and alert. Oriented.  Skin:  Skin is warm, dry and intact. No rash noted. Psychiatric: Mood and affect are normal. Speech and behavior are normal. Patient exhibits appropriate insight and judgment.  ____________________________________________    LABS (pertinent positives/negatives)  CBC wbc  6.1, hgb 14.8, plt 188 BMP wnl except ca 8.7 UA not consistent with infection  ____________________________________________   EKG  I, Nance Pear, attending physician, personally viewed and interpreted this EKG  EKG Time: 1707 Rate: 80 Rhythm: sinus rhythm Axis: normal Intervals: qtc 436 QRS: narrow ST changes: no st elevation Impression: normal ekg  ____________________________________________    RADIOLOGY  CT head Unremarkable CT head  DG abd Moderate stool burden.   ____________________________________________   PROCEDURES  Procedures  ____________________________________________   INITIAL IMPRESSION / ASSESSMENT AND PLAN / ED COURSE  Pertinent labs & imaging results that were available during my care of the patient were reviewed by me and considered in my medical decision making (see chart for details).   Presented to the emergency department today from nursing facility because of concerns for an episode of unresponsiveness.  Per the patient he was aware however was having  a hard time with his movements.  It does sound like this occurs all day and had a slightly decreased level with his Parkinson's.  Work-up here without any obvious acute etiology of the patient's symptoms.  Discussed this with the patient.  At this point he feels comfortable going back to his facility.   ____________________________________________   FINAL CLINICAL IMPRESSION(S) / ED DIAGNOSES  Final diagnoses:  Altered mental status, unspecified altered mental status type     Note: This dictation was prepared with Dragon dictation. Any transcriptional errors that result from this process are unintentional     Nance Pear, MD 02/13/18 1919

## 2018-02-13 NOTE — ED Triage Notes (Signed)
Pt arrived via ems from assisted living in [facility] - staff reports that pt was "unresponsive" - when EMS arrived he was A&O x4 - pt reports that he "freezes up" with his Parkinson sometimes and that this is what occurred - denies any pain and is in NAD

## 2018-02-13 NOTE — Discharge Instructions (Addendum)
Please seek medical attention for any high fevers, chest pain, shortness of breath, change in behavior, persistent vomiting, bloody stool or any other new or concerning symptoms.  

## 2018-02-13 NOTE — ED Notes (Signed)
Assisted up to BR, voided large amt

## 2018-02-13 NOTE — ED Notes (Signed)
Pt pull up changed - large amount of urine noted

## 2018-02-13 NOTE — ED Notes (Signed)
EMS called to [place].

## 2018-02-14 ENCOUNTER — Encounter: Payer: Self-pay | Admitting: Internal Medicine

## 2018-02-14 DIAGNOSIS — G20 Parkinson's disease: Secondary | ICD-10-CM | POA: Diagnosis not present

## 2018-02-15 ENCOUNTER — Telehealth: Payer: Self-pay

## 2018-02-15 NOTE — Telephone Encounter (Signed)
Left a message for Lucas Edwards at [facility] to find out how he is doing after he went to the ER.

## 2018-02-21 DIAGNOSIS — Z96649 Presence of unspecified artificial hip joint: Secondary | ICD-10-CM | POA: Diagnosis not present

## 2018-02-21 DIAGNOSIS — Z8781 Personal history of (healed) traumatic fracture: Secondary | ICD-10-CM | POA: Diagnosis not present

## 2018-02-23 ENCOUNTER — Ambulatory Visit: Payer: Medicare Other | Admitting: Internal Medicine

## 2018-02-23 DIAGNOSIS — G20A1 Parkinson's disease without dyskinesia, without mention of fluctuations: Secondary | ICD-10-CM

## 2018-02-23 DIAGNOSIS — G20 Parkinson's disease: Secondary | ICD-10-CM | POA: Diagnosis not present

## 2018-02-23 DIAGNOSIS — F0280 Dementia in other diseases classified elsewhere without behavioral disturbance: Secondary | ICD-10-CM | POA: Diagnosis not present

## 2018-02-23 DIAGNOSIS — I10 Essential (primary) hypertension: Secondary | ICD-10-CM | POA: Diagnosis not present

## 2018-02-23 DIAGNOSIS — R32 Unspecified urinary incontinence: Secondary | ICD-10-CM | POA: Diagnosis not present

## 2018-02-23 DIAGNOSIS — K219 Gastro-esophageal reflux disease without esophagitis: Secondary | ICD-10-CM

## 2018-02-26 ENCOUNTER — Encounter: Payer: Self-pay | Admitting: Internal Medicine

## 2018-02-26 DIAGNOSIS — K219 Gastro-esophageal reflux disease without esophagitis: Secondary | ICD-10-CM | POA: Insufficient documentation

## 2018-02-26 NOTE — Progress Notes (Signed)
Subjective:    Patient ID: Lucas Edwards, male    DOB: 1950/04/21, 67 y.o.   MRN: [id number]  HPI  Resident due for routine follow up in [address] Discussed with RN, no new concerns Resident reports he does not sleep well but never has. He has issues with urinary incontinence, nocturia. Due to his parkinson's, he is unable to get out of the bed by himself so he sleeps in the chair. He walks with a rollator. His appetite is good, weight is stable His bowels are okay. He denies c/o pain, reflux or SOB He goes to boxing 2 x week, enjoys this  Parkinson's: He is taking Sinemet and Entacapone as prescribed. He is not happy with his current Sinemet regimen and has an appt with [name] on Monday to discuss this.   Dementia of Parkinson's: Mild cognitive issues. He is taking Aricept as prescribed. Settling in to [facility]F.  HTN: Controlled off meds.  HLD: His last LDL was 119, 04/2017. He is not currently taking any cholesterol lowering medication.  GERD: He denies breakthrough symptoms on Pantoprazole. He denies difficulty swallowing.   Urinary Incontinence: Persistent. He is taking Terazosin as prescribed, but is not sure if it helps.    Review of Systems      Past Medical History:  Diagnosis Date  . Abdominal hernia   . 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 Essential hypertension, benign   . GERD (gastroesophageal reflux disease)   . Hyperlipidemia   . Parkinson disease ([place])   . Urinary incontinence     Current Outpatient Medications  Medication Sig Dispense Refill  . carbidopa-levodopa (SINEMET IR) 25-100 MG tablet Take 2 tablets by mouth. TAKE 2 TABLETS BY MOUTH SIX TIMES DAILY AND TAKE 1 ADDITIONAL TABLET BY MOUTH AT BEDTIME    . docusate sodium (COLACE) 100 MG capsule Take 100 mg by mouth 4 (four) times daily.     Marland Kitchen donepezil (ARICEPT) 10 MG tablet Take 10 mg by mouth every evening.     . entacapone (COMTAN) 200 MG tablet Take 200 mg by  mouth 4 (four) times daily.    . pantoprazole (PROTONIX) 40 MG tablet Take 40 mg by mouth daily.     . polyethylene glycol (MIRALAX / GLYCOLAX) packet Take 17 g by mouth every other day.    . cetirizine (ZYRTEC) 10 MG tablet Take 10 mg by mouth at bedtime as needed for allergies.    Marland Kitchen dextromethorphan-guaiFENesin (ROBITUSSIN-DM) 10-100 MG/5ML liquid Take 10 mLs by mouth every 4 (four) hours as needed for cough.    Marland Kitchen ibuprofen (ADVIL,MOTRIN) 200 MG tablet Take 200 mg by mouth every 6 (six) hours as needed.    . nystatin (MYCOSTATIN/NYSTOP) powder Apply 1 Bottle topically 4 (four) times daily.    . ondansetron (ZOFRAN) 4 MG tablet Take 4 mg by mouth every 8 (eight) hours as needed for nausea or vomiting.     No current facility-administered medications for this visit.     Allergies  Allergen Reactions  . Lorazepam Other (See Comments)    confusion  . Sulfa Antibiotics Hives  . Tylenol [Acetaminophen] Itching    Family History  Problem Relation Age of Onset  . Multiple sclerosis Mother        died @ 93  . Stroke Father        died @ 86  . Heart attack Father   . Lung cancer Sister  died in her 63's    Social History   Socioeconomic History  . Marital status: Widowed    Spouse name: Not on file  . Number of children: 1  . Years of education: Not on file  . Highest education level: Not on file  Occupational History  . Occupation: Clinical cytogeneticist    Comment: Disabled/retired  Social Needs  . Financial resource strain: Not on file  . Food insecurity:    Worry: Not on file    Inability: Not on file  . Transportation needs:    Medical: Not on file    Non-medical: Not on file  Tobacco Use  . Smoking status: Former Research scientist (life sciences)  . Smokeless tobacco: Never Used  . Tobacco comment: during high school/college  Substance and Sexual Activity  . Alcohol use: Yes    Comment: whenever he gets the chance (once every few months)  . Drug use: No    Comment: marijuana occ  .  Sexual activity: Never  Lifestyle  . Physical activity:    Days per week: Not on file    Minutes per session: Not on file  . Stress: Not on file  Relationships  . Social connections:    Talks on phone: Not on file    Gets together: Not on file    Attends religious service: Not on file    Active member of club or organization: Not on file    Attends meetings of clubs or organizations: Not on file    Relationship status: Not on file  . Intimate partner violence:    Fear of current or ex partner: Not on file    Emotionally abused: Not on file    Physically abused: Not on file    Forced sexual activity: Not on file  Other Topics Concern  . Not on file  Social History Narrative   Divorced then widowed   1 stepdaughter      Has living will   Niece [name] is health care POA   Has DNR   Would accept hospital   No extended tube feedings     Constitutional: Denies fever, malaise, fatigue, headache or abrupt weight changes.  HEENT: Denies eye pain, eye redness, ear pain, ringing in the ears, wax buildup, runny nose, nasal congestion, bloody nose, or sore throat. Respiratory: Denies difficulty breathing, shortness of breath, cough or sputum production.   Cardiovascular: Denies chest pain, chest tightness, palpitations or swelling in the hands or feet.  Gastrointestinal: Denies abdominal pain, bloating, constipation, diarrhea or blood in the stool.  GU: Pt reports urinary incontinence. Denies urgency, frequency, pain with urination, burning sensation, blood in urine, odor or discharge. Musculoskeletal: Pt reports stiffness, difficulty with gait. Denies decrease in range of motion, muscle pain or joint pain and swelling.  Skin: Denies redness, rashes, lesions or ulcercations.  Neurological: Pt reports problems with balanced and coordination. Denies dizziness, difficulty with memory, difficulty with speech.  Psych: Denies anxiety, depression, SI/HI.  No other specific complaints  in a complete review of systems (except as listed in HPI above).  Objective:   Physical Exam  BP 118/79   Pulse 81   Temp 98.1 F (36.7 C)   Resp 20   Wt 171 lb (77.6 kg)   BMI 24.54 kg/m  Wt Readings from Last 3 Encounters:  02/26/18 171 lb (77.6 kg)  02/13/18 170 lb (77.1 kg)  11/25/17 167 lb 6.4 oz (75.9 kg)    General: Appears older than stated age, in  NAD. Neck:  Neck supple, trachea midline. No masses, lumps or thyromegaly present.  Cardiovascular: Normal rate and rhythm. S1,S2 noted.  No murmur, rubs or gallops noted. No JVD or BLE edema.  Pulmonary/Chest: Normal effort and positive vesicular breath sounds. No respiratory distress. No wheezes, rales or ronchi noted.  Abdomen: Soft and nontender. Normal bowel sounds. No distention or masses noted. Musculoskeletal: Bradykinesia noted. Spine curved. Gait slow but steady with use of rolling walker. Neurological: Alert and oriented.    BMET    Component Value Date/Time   NA 139 02/13/2018 1718   NA 137 06/24/2013 1454   K 3.7 02/13/2018 1718   K 3.7 06/24/2013 1454   CL 105 02/13/2018 1718   CL 105 06/24/2013 1454   CO2 28 02/13/2018 1718   CO2 29 06/24/2013 1454   GLUCOSE 89 02/13/2018 1718   GLUCOSE 106 (H) 06/24/2013 1454   BUN 16 02/13/2018 1718   BUN 14 06/24/2013 1454   CREATININE 0.93 02/13/2018 1718   CREATININE 1.07 06/24/2013 1454   CALCIUM 8.7 (L) 02/13/2018 1718   CALCIUM 8.2 (L) 06/24/2013 1454   GFRNONAA >60 02/13/2018 1718   GFRNONAA >60 06/24/2013 1454   GFRAA >60 02/13/2018 1718   GFRAA >60 06/24/2013 1454    Lipid Panel     Component Value Date/Time   CHOL 173 05/15/2017 0632   TRIG 85 05/15/2017 0632   HDL 37 (L) 05/15/2017 0632   CHOLHDL 4.7 05/15/2017 0632   VLDL 17 05/15/2017 0632   LDLCALC 119 (H) 05/15/2017 0632    CBC    Component Value Date/Time   WBC 6.1 02/13/2018 1718   RBC 4.91 02/13/2018 1718   HGB 14.8 02/13/2018 1718   HGB 15.3 06/24/2013 1454   HCT 44.5  02/13/2018 1718   HCT 45.4 06/24/2013 1454   PLT 188 02/13/2018 1718   PLT 186 06/24/2013 1454   MCV 90.6 02/13/2018 1718   MCV 88 06/24/2013 1454   MCH 30.1 02/13/2018 1718   MCHC 33.3 02/13/2018 1718   RDW 13.3 02/13/2018 1718   RDW 14.0 06/24/2013 1454   LYMPHSABS 1.0 02/13/2018 1718   MONOABS 0.5 02/13/2018 1718   EOSABS 0.1 02/13/2018 1718   BASOSABS 0.1 02/13/2018 1718    Hgb A1C No results found for: HGBA1C          Assessment & Plan:

## 2018-02-26 NOTE — Assessment & Plan Note (Signed)
He has an appt with [name] on Monday Continue Sinamet and Entacapone as prescribed Will monitor Appreciate ALF care

## 2018-02-26 NOTE — Assessment & Plan Note (Signed)
Mild Stable on Aricept Appreciate ALF care

## 2018-02-26 NOTE — Assessment & Plan Note (Signed)
Continue Terazosin Will discuss with RN about keeping urinal near recliner at night

## 2018-02-26 NOTE — Assessment & Plan Note (Signed)
Continue Pantoprazole Will monitor for now

## 2018-02-26 NOTE — Assessment & Plan Note (Signed)
Controlled off meds  Will monitor 

## 2018-02-26 NOTE — Patient Instructions (Signed)
Urinary Incontinence Urinary incontinence is the involuntary loss of urine from your bladder. What are the causes? There are many causes of urinary incontinence. They include:  Medicines.  Infections.  Prostatic enlargement, leading to overflow of urine from your bladder.  Surgery.  Neurological diseases.  Emotional factors.  What are the signs or symptoms? Urinary Incontinence can be divided into four types: 1. Urge incontinence. Urge incontinence is the involuntary loss of urine before you have the opportunity to go to the bathroom. There is a sudden urge to void but not enough time to reach a bathroom. 2. Stress incontinence. Stress incontinence is the sudden loss of urine with any activity that forces urine to pass. It is commonly caused by anatomical changes to the pelvis and sphincter areas of your body. 3. Overflow incontinence. Overflow incontinence is the loss of urine from an obstructed opening to your bladder. This results in a backup of urine and a resultant buildup of pressure within the bladder. When the pressure within the bladder exceeds the closing pressure of the sphincter, the urine overflows, which causes incontinence, similar to water overflowing a dam. 4. Total incontinence. Total incontinence is the loss of urine as a result of the inability to store urine within your bladder.  How is this diagnosed? Evaluating the cause of incontinence may require:  A thorough and complete medical and obstetric history.  A complete physical exam.  Laboratory tests such as a urine culture and sensitivities.  When additional tests are indicated, they can include:  An ultrasound exam.  Kidney and bladder X-rays.  Cystoscopy. This is an exam of the bladder using a narrow scope.  Urodynamic testing to test the nerve function to the bladder and sphincter areas.  How is this treated? Treatment for urinary incontinence depends on the cause:  For urge incontinence caused  by a bacterial infection, antibiotics will be prescribed. If the urge incontinence is related to medicines you take, your health care provider may have you change the medicine.  For stress incontinence, surgery to re-establish anatomical support to the bladder or sphincter, or both, will often correct the condition.  For overflow incontinence caused by an enlarged prostate, an operation to open the channel through the enlarged prostate will allow the flow of urine out of the bladder. In women with fibroids, a hysterectomy may be recommended.  For total incontinence, surgery on your urinary sphincter may help. An artificial urinary sphincter (an inflatable cuff placed around the urethra) may be required. In women who have developed a hole-like passage between their bladder and vagina (vesicovaginal fistula), surgery to close the fistula often is required.  Follow these instructions at home:  Normal daily hygiene and the use of pads or adult diapers that are changed regularly will help prevent odors and skin damage.  Avoid caffeine. It can overstimulate your bladder.  Use the bathroom regularly. Try about every 2-3 hours to go to the bathroom, even if you do not feel the need to do so. Take time to empty your bladder completely. After urinating, wait a minute. Then try to urinate again.  For causes involving nerve dysfunction, keep a log of the medicines you take and a journal of the times you go to the bathroom. Contact a health care provider if:  You experience worsening of pain instead of improvement in pain after your procedure.  Your incontinence becomes worse instead of better. Get help right away if:  You experience fever or shaking chills.  You are unable to   pass your urine.  You have redness spreading into your groin or down into your thighs. This information is not intended to replace advice given to you by your health care provider. Make sure you discuss any questions you have  with your health care provider. Document Released: 04/23/2004 Document Revised: 10/25/2015 Document Reviewed: 08/23/2012 Elsevier Interactive Patient Education  2018 [employer]  

## 2018-02-28 DIAGNOSIS — G20 Parkinson's disease: Secondary | ICD-10-CM | POA: Diagnosis not present

## 2018-02-28 DIAGNOSIS — G4711 Idiopathic hypersomnia with long sleep time: Secondary | ICD-10-CM | POA: Diagnosis not present

## 2018-02-28 DIAGNOSIS — G4752 REM sleep behavior disorder: Secondary | ICD-10-CM | POA: Diagnosis not present

## 2018-02-28 DIAGNOSIS — R413 Other amnesia: Secondary | ICD-10-CM | POA: Diagnosis not present

## 2018-04-06 ENCOUNTER — Other Ambulatory Visit: Payer: Self-pay

## 2018-04-06 ENCOUNTER — Encounter: Payer: Self-pay | Admitting: Emergency Medicine

## 2018-04-06 ENCOUNTER — Emergency Department: Payer: Medicare Other

## 2018-04-06 ENCOUNTER — Emergency Department
Admission: EM | Admit: 2018-04-06 | Discharge: 2018-04-06 | Disposition: A | Discharge status: Skilled Nursing Facility | Payer: Medicare Other | Attending: Emergency Medicine | Admitting: Emergency Medicine

## 2018-04-06 DIAGNOSIS — Z87891 Personal history of nicotine dependence: Secondary | ICD-10-CM | POA: Diagnosis not present

## 2018-04-06 DIAGNOSIS — I10 Essential (primary) hypertension: Secondary | ICD-10-CM | POA: Insufficient documentation

## 2018-04-06 DIAGNOSIS — G20 Parkinson's disease: Secondary | ICD-10-CM | POA: Diagnosis not present

## 2018-04-06 DIAGNOSIS — R000 Tachycardia, unspecified: Secondary | ICD-10-CM | POA: Diagnosis not present

## 2018-04-06 DIAGNOSIS — R52 Pain, unspecified: Secondary | ICD-10-CM | POA: Diagnosis not present

## 2018-04-06 DIAGNOSIS — R55 Syncope and collapse: Secondary | ICD-10-CM | POA: Diagnosis not present

## 2018-04-06 DIAGNOSIS — F0280 Dementia in other diseases classified elsewhere without behavioral disturbance: Secondary | ICD-10-CM | POA: Insufficient documentation

## 2018-04-06 DIAGNOSIS — R4182 Altered mental status, unspecified: Secondary | ICD-10-CM | POA: Diagnosis not present

## 2018-04-06 DIAGNOSIS — R4020 Unspecified coma: Secondary | ICD-10-CM | POA: Diagnosis not present

## 2018-04-06 LAB — URINALYSIS, COMPLETE (UACMP) WITH MICROSCOPIC
BACTERIA UA: NONE SEEN
BILIRUBIN URINE: NEGATIVE
Glucose, UA: NEGATIVE mg/dL
HGB URINE DIPSTICK: NEGATIVE
KETONES UR: NEGATIVE mg/dL
LEUKOCYTES UA: NEGATIVE
NITRITE: NEGATIVE
PROTEIN: NEGATIVE mg/dL
Specific Gravity, Urine: 1.008 (ref 1.005–1.030)
Squamous Epithelial / LPF: NONE SEEN (ref 0–5)
pH: 7 (ref 5.0–8.0)

## 2018-04-06 LAB — CBC WITH DIFFERENTIAL/PLATELET
ABS IMMATURE GRANULOCYTES: 0.03 10*3/uL (ref 0.00–0.07)
BASOS ABS: 0.1 10*3/uL (ref 0.0–0.1)
BASOS PCT: 1 %
Eosinophils Absolute: 0.2 10*3/uL (ref 0.0–0.5)
Eosinophils Relative: 2 %
HCT: 50.6 % (ref 39.0–52.0)
HEMOGLOBIN: 16.9 g/dL (ref 13.0–17.0)
Immature Granulocytes: 0 %
LYMPHS PCT: 19 %
Lymphs Abs: 1.3 10*3/uL (ref 0.7–4.0)
MCH: 29.9 pg (ref 26.0–34.0)
MCHC: 33.4 g/dL (ref 30.0–36.0)
MCV: 89.6 fL (ref 80.0–100.0)
Monocytes Absolute: 0.4 10*3/uL (ref 0.1–1.0)
Monocytes Relative: 6 %
NEUTROS ABS: 4.8 10*3/uL (ref 1.7–7.7)
NRBC: 0 % (ref 0.0–0.2)
Neutrophils Relative %: 72 %
PLATELETS: 182 10*3/uL (ref 150–400)
RBC: 5.65 MIL/uL (ref 4.22–5.81)
RDW: 13.2 % (ref 11.5–15.5)
WBC: 7 10*3/uL (ref 4.0–10.5)

## 2018-04-06 LAB — COMPREHENSIVE METABOLIC PANEL
ALBUMIN: 4.3 g/dL (ref 3.5–5.0)
ALK PHOS: 74 U/L (ref 38–126)
ALT: 8 U/L (ref 0–44)
ANION GAP: 11 (ref 5–15)
AST: 23 U/L (ref 15–41)
BUN: 15 mg/dL (ref 8–23)
CHLORIDE: 104 mmol/L (ref 98–111)
CO2: 24 mmol/L (ref 22–32)
Calcium: 9.1 mg/dL (ref 8.9–10.3)
Creatinine, Ser: 1.06 mg/dL (ref 0.61–1.24)
GFR calc non Af Amer: >60 mL/min (ref >60)
GLUCOSE: 109 mg/dL — AB (ref 70–99)
Potassium: 4.3 mmol/L (ref 3.5–5.1)
SODIUM: 139 mmol/L (ref 135–145)
Total Bilirubin: 1.2 mg/dL (ref 0.3–1.2)
Total Protein: 7.7 g/dL (ref 6.5–8.1)

## 2018-04-06 LAB — TROPONIN I: Troponin I: <0.03 ng/mL (ref <0.03)

## 2018-04-06 MED ORDER — SODIUM CHLORIDE 0.9 % IV SOLN: 1000.0000 mL | Freq: Once | INTRAVENOUS | Status: DC

## 2018-04-06 NOTE — ED Provider Notes (Signed)
[facility] Emergency Department Provider Note       Time seen: ----------------------------------------- 8:13 AM on 04/06/2018 -----------------------------------------   I have reviewed the triage vital signs and the nursing notes.  HISTORY   Chief Complaint Loss of Consciousness    HPI Lucas Edwards is a 68 y.o. male with a history of dementia, GERD, hyperlipidemia, Parkinson's disease who presents to the ED for syncope.  Patient comes by EMS from [facility] for a possible syncopal episode.  Patient states he was sitting in a chair when he became unable to move or respond.  Staff felt that he was unconscious.  Patient states he could hear the staff speaking but could not respond.  Patient felt like it was a side effect of his medication, reports drowsiness for the past 2 weeks.  Past Medical History:  Diagnosis Date  . Abdominal hernia   . 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 Essential hypertension, benign   . GERD (gastroesophageal reflux disease)   . Hyperlipidemia   . Parkinson disease ([place])   . Urinary incontinence     Patient Active Problem List   Diagnosis Date Noted  . GERD (gastroesophageal reflux disease) 02/26/2018  . Essential hypertension, benign   . Parkinson disease ([place])   . 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 Urinary incontinence   . 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 HIP ARTHROPLASTY Left 08/06/2017   Procedure: ARTHROPLASTY BIPOLAR HIP (HEMIARTHROPLASTY);  Surgeon: Corky Mull, MD;  Location: [facility] ORS;  Service: Orthopedics;  Laterality: Left;  . INGUINAL HERNIA REPAIR Bilateral   . VASECTOMY      Allergies Lorazepam; Sulfa antibiotics; and Tylenol [acetaminophen]  Social History Social History   Tobacco Use  . Smoking status: Former Research scientist (life sciences)  . Smokeless tobacco: Never Used  . Tobacco comment: during high  school/college  Substance Use Topics  . Alcohol use: Yes    Comment: whenever he gets the chance (once every few months)  . Drug use: No    Comment: marijuana occ   Review of Systems Constitutional: Negative for fever. Cardiovascular: Negative for chest pain. Respiratory: Negative for shortness of breath. Gastrointestinal: Negative for abdominal pain, vomiting and diarrhea. Musculoskeletal: Negative for back pain. Skin: Negative for rash. Neurological: Positive for weakness  All systems negative/normal/unremarkable except as stated in the HPI  ____________________________________________   PHYSICAL EXAM:  VITAL SIGNS: ED Triage Vitals  Enc Vitals Group     BP      Pulse      Resp      Temp      Temp src      SpO2      Weight      Height      Head Circumference      Peak Flow      Pain Score      Pain Loc      Pain Edu?      Excl. in [place]?    Constitutional: Alert and oriented.  No distress ENT      Head: Normocephalic and atraumatic.      Nose: No congestion/rhinnorhea.      Mouth/Throat: Mucous membranes are moist.      Neck: No stridor. Cardiovascular: Normal rate, regular rhythm. No murmurs, rubs, or gallops. Respiratory: Normal respiratory effort without tachypnea nor retractions. Breath sounds are clear and equal bilaterally. No wheezes/rales/rhonchi. Gastrointestinal: Soft and nontender. Normal  bowel sounds Musculoskeletal: Nontender with normal range of motion in extremities. No lower extremity tenderness nor edema. Neurologic: Monotonic speech.  No gross focal neurologic deficits are appreciated.  Parkinsonian appearance Skin:  Skin is warm, dry and intact. No rash noted. Psychiatric: Mood and affect are normal.  ____________________________________________  EKG: Interpreted by me.  Sinus rhythm rate 92 bpm, normal PR interval, normal QRS, premature ventricular complexes, normal QT  ____________________________________________  ED COURSE:  As part  of my medical decision making, I reviewed the following data within the [place] History obtained from family if available, nursing notes, old chart and ekg, as well as notes from prior ED visits. Patient presented for possible syncope, we will assess with labs and imaging as indicated at this time.   Procedures ____________________________________________   LABS (pertinent positives/negatives)  Labs Reviewed  COMPREHENSIVE METABOLIC PANEL - Abnormal; Notable for the following components:      Result Value   Glucose, Bld 109 (*)    All other components within normal limits  URINALYSIS, COMPLETE (UACMP) WITH MICROSCOPIC - Abnormal; Notable for the following components:   Color, Urine YELLOW (*)    APPearance CLEAR (*)    All other components within normal limits  CBC WITH DIFFERENTIAL/PLATELET  TROPONIN I  CBG MONITORING, ED    RADIOLOGY Images were viewed by me  CT head IMPRESSION: Stable, negative head CT. ____________________________________________   DIFFERENTIAL DIAGNOSIS   Syncope, arrhythmia, dehydration, seizure, electrolyte abnormality, occult infection  FINAL ASSESSMENT AND PLAN  Altered mental status   Plan: The patient had presented for altered mental status. Patient's labs were reassuring. Patient's imaging was also reassuring.  He appears to be at his baseline currently.  Patient states he got real sleepy and could not really move, he could hear and was aware of what was happening around him.  He reports this is happened before.  I suspect this is Parkinson's related.  Overall he appears cleared for outpatient follow-up.   Laurence Aly, MD    Note: This note was generated in part or whole with voice recognition software. Voice recognition is usually quite accurate but there are transcription errors that can and very often do occur. I apologize for any typographical errors that were not detected and corrected.     Earleen Newport, MD 04/06/18 [phone]

## 2018-04-06 NOTE — ED Notes (Signed)
Lucas Edwards from [facility] stated she was getting transportation for pt.

## 2018-04-06 NOTE — ED Triage Notes (Signed)
Patient presents to the ED via EMS from [facility] for a syncopal episode.  Patient states he was sitting in a chair when he became unable to move or respond and staff felt that patient had become unconscious.  Patient states he could hear staff speaking to him but could not respond.  Per patient, he feels like this is a side effect of his medication, he says he feels very drowsy, particularly for the past 2 weeks.  Patient also reports episodes of "mouth tightening".  Patient is alert and oriented x 4 at this time.  Able to stand, speak and respond appropriately.

## 2018-04-11 ENCOUNTER — Other Ambulatory Visit: Payer: Self-pay | Admitting: Neurology

## 2018-04-11 DIAGNOSIS — R1310 Dysphagia, unspecified: Secondary | ICD-10-CM

## 2018-04-12 DIAGNOSIS — G20 Parkinson's disease: Secondary | ICD-10-CM | POA: Diagnosis not present

## 2018-04-12 DIAGNOSIS — R413 Other amnesia: Secondary | ICD-10-CM | POA: Diagnosis not present

## 2018-04-12 DIAGNOSIS — G4752 REM sleep behavior disorder: Secondary | ICD-10-CM | POA: Diagnosis not present

## 2018-04-12 DIAGNOSIS — G4711 Idiopathic hypersomnia with long sleep time: Secondary | ICD-10-CM | POA: Diagnosis not present

## 2018-04-23 ENCOUNTER — Encounter: Payer: Self-pay | Admitting: Emergency Medicine

## 2018-04-23 ENCOUNTER — Emergency Department
Admission: EM | Admit: 2018-04-23 | Discharge: 2018-04-23 | Disposition: A | Discharge status: Home / Self Care | Payer: Medicare Other | Attending: Emergency Medicine | Admitting: Emergency Medicine

## 2018-04-23 ENCOUNTER — Emergency Department: Payer: Medicare Other

## 2018-04-23 ENCOUNTER — Other Ambulatory Visit: Payer: Self-pay

## 2018-04-23 DIAGNOSIS — Z87891 Personal history of nicotine dependence: Secondary | ICD-10-CM | POA: Diagnosis not present

## 2018-04-23 DIAGNOSIS — G20 Parkinson's disease: Secondary | ICD-10-CM | POA: Diagnosis not present

## 2018-04-23 DIAGNOSIS — R52 Pain, unspecified: Secondary | ICD-10-CM | POA: Diagnosis not present

## 2018-04-23 DIAGNOSIS — I10 Essential (primary) hypertension: Secondary | ICD-10-CM | POA: Insufficient documentation

## 2018-04-23 DIAGNOSIS — R1310 Dysphagia, unspecified: Secondary | ICD-10-CM | POA: Diagnosis not present

## 2018-04-23 DIAGNOSIS — Z79899 Other long term (current) drug therapy: Secondary | ICD-10-CM | POA: Insufficient documentation

## 2018-04-23 DIAGNOSIS — G308 Other Alzheimer's disease: Secondary | ICD-10-CM | POA: Insufficient documentation

## 2018-04-23 DIAGNOSIS — Z7401 Bed confinement status: Secondary | ICD-10-CM | POA: Diagnosis not present

## 2018-04-23 DIAGNOSIS — M2550 Pain in unspecified joint: Secondary | ICD-10-CM | POA: Diagnosis not present

## 2018-04-23 DIAGNOSIS — Z96642 Presence of left artificial hip joint: Secondary | ICD-10-CM | POA: Insufficient documentation

## 2018-04-23 LAB — URINALYSIS, COMPLETE (UACMP) WITH MICROSCOPIC
Bacteria, UA: NONE SEEN
Bilirubin Urine: NEGATIVE
GLUCOSE, UA: NEGATIVE mg/dL
Ketones, ur: NEGATIVE mg/dL
Leukocytes, UA: NEGATIVE
Nitrite: NEGATIVE
Protein, ur: NEGATIVE mg/dL
SPECIFIC GRAVITY, URINE: 1.002 — AB (ref 1.005–1.030)
pH: 8 (ref 5.0–8.0)

## 2018-04-23 LAB — BASIC METABOLIC PANEL
Anion gap: 7 (ref 5–15)
BUN: 8 mg/dL (ref 8–23)
CHLORIDE: 108 mmol/L (ref 98–111)
CO2: 27 mmol/L (ref 22–32)
Calcium: 9.1 mg/dL (ref 8.9–10.3)
Creatinine, Ser: 0.81 mg/dL (ref 0.61–1.24)
GFR calc Af Amer: >60 mL/min (ref >60)
GFR calc non Af Amer: >60 mL/min (ref >60)
Glucose, Bld: 104 mg/dL — ABNORMAL HIGH (ref 70–99)
POTASSIUM: 3.8 mmol/L (ref 3.5–5.1)
Sodium: 142 mmol/L (ref 135–145)

## 2018-04-23 LAB — CBC WITH DIFFERENTIAL/PLATELET
ABS IMMATURE GRANULOCYTES: 0.02 10*3/uL (ref 0.00–0.07)
Basophils Absolute: 0.1 10*3/uL (ref 0.0–0.1)
Basophils Relative: 1 %
Eosinophils Absolute: 0.1 10*3/uL (ref 0.0–0.5)
Eosinophils Relative: 1 %
HCT: 48.4 % (ref 39.0–52.0)
HEMOGLOBIN: 16.2 g/dL (ref 13.0–17.0)
Immature Granulocytes: 0 %
LYMPHS PCT: 13 %
Lymphs Abs: 0.9 10*3/uL (ref 0.7–4.0)
MCH: 30.2 pg (ref 26.0–34.0)
MCHC: 33.5 g/dL (ref 30.0–36.0)
MCV: 90.3 fL (ref 80.0–100.0)
MONO ABS: 0.4 10*3/uL (ref 0.1–1.0)
MONOS PCT: 6 %
NEUTROS ABS: 5.2 10*3/uL (ref 1.7–7.7)
Neutrophils Relative %: 79 %
Platelets: 210 10*3/uL (ref 150–400)
RBC: 5.36 MIL/uL (ref 4.22–5.81)
RDW: 13.1 % (ref 11.5–15.5)
WBC: 6.7 10*3/uL (ref 4.0–10.5)
nRBC: 0 % (ref 0.0–0.2)

## 2018-04-23 MED ORDER — SODIUM CHLORIDE 0.9 % IV BOLUS
1000.0000 mL | Freq: Once | INTRAVENOUS | Status: AC
  Administered 2018-04-23: 1000 mL via INTRAVENOUS

## 2018-04-23 MED ORDER — CARBIDOPA-LEVODOPA 25-100 MG PO TABS
2.0000 | ORAL_TABLET | ORAL | Status: AC
  Administered 2018-04-23: 2 via ORAL
  Filled 2018-04-23: qty 2

## 2018-04-23 NOTE — ED Triage Notes (Signed)
Pt presents from [facility] via [facility] with c/o being unable to swallow since 3am this morning and dry mouth. Pt has hx of parkinson's disease. Facility reports that for the last month, pt has had several episodes of "locking up". Pt states during these episodes his body gets so stiff he cannot move. Pt has missed 3 doses of parkinson's medications due to inability to swallow. VSS for EMS.

## 2018-04-23 NOTE — ED Notes (Signed)
Pt DNR placed in chart.

## 2018-04-23 NOTE — Discharge Instructions (Addendum)
Your labs are all okay today. Continue taking all your medications as prescribed and follow up with [name] on Monday.

## 2018-04-23 NOTE — ED Notes (Signed)
Pt cleaned and changed into scrub bottoms before D/C back to facility. Pt in NAD at time of D/C. Belongings including clothes and shoes sent back with EMS.

## 2018-04-23 NOTE — ED Notes (Signed)
Pt given grape juice to PO challenge.

## 2018-04-23 NOTE — ED Provider Notes (Signed)
[facility] Emergency Department Provider Note  ____________________________________________  Time seen: Approximately 7:33 PM  I have reviewed the triage vital signs and the nursing notes.   HISTORY  Chief Complaint Dysphagia    HPI Lucas Edwards is a 68 y.o. male with a history of dementia, Parkinson's disease, hypertension, GERD who comes the ED from [facility] due to difficulty swallowing today.  Because of this he has had trouble taking his medicines today, although he does note that his Sinemet is a disintegrating tablet that melts in his mouth.  Denies any dysuria frequency urgency out of the ordinary, denies any cough chest pain shortness of breath fevers chills belly pain or vomiting.  He feels like it is difficult to swallow.  No foreign body sensation.  Symptoms are intermittent, no aggravating or alleviating factors.      Past Medical History:  Diagnosis Date  . Abdominal hernia   . 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 Essential hypertension, benign   . GERD (gastroesophageal reflux disease)   . Hyperlipidemia   . Parkinson disease ([place])   . Urinary incontinence      Patient Active Problem List   Diagnosis Date Noted  . GERD (gastroesophageal reflux disease) 02/26/2018  . Essential hypertension, benign   . Parkinson disease ([place])   . 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 Urinary incontinence   . 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 HIP ARTHROPLASTY Left 08/06/2017   Procedure: ARTHROPLASTY BIPOLAR HIP (HEMIARTHROPLASTY);  Surgeon: Corky Mull, MD;  Location: [facility] ORS;  Service: Orthopedics;  Laterality: Left;  . INGUINAL HERNIA REPAIR Bilateral   . VASECTOMY       Prior to Admission medications   Medication Sig Start Date End Date Taking? Authorizing Provider  carbidopa-levodopa (SINEMET IR) 25-100 MG tablet Take 1 tablet by mouth 3  (three) times daily.   Yes [provider]  docusate sodium (COLACE) 100 MG capsule Take 100 mg by mouth 4 (four) times daily.    Yes [provider]  donepezil (ARICEPT) 10 MG tablet Take 10 mg by mouth every evening.    Yes [provider]  entacapone (COMTAN) 200 MG tablet Take 200 mg by mouth 4 (four) times daily.   Yes [provider]  pantoprazole (PROTONIX) 40 MG tablet Take 40 mg by mouth daily.  10/21/13  Yes [provider]  alum & mag hydroxide-simeth ([place]) 200-200-20 MG/5ML suspension Take 30 mLs by mouth every 4 (four) hours as needed for indigestion, heartburn or flatulence.    [provider]  bismuth subsalicylate (KAOPECTATE) 262 MG/15ML suspension Take 10 mLs by mouth 3 (three) times daily as needed for diarrhea or loose stools.    [provider]  carbamide peroxide (DEBROX) 6.5 % OTIC solution Place 5 drops into both ears daily as needed (cerumen impaction).    [provider]  cetirizine (ZYRTEC) 10 MG tablet Take 10 mg by mouth at bedtime as needed for allergies.    [provider]  dextromethorphan-guaiFENesin (ROBITUSSIN-DM) 10-100 MG/5ML liquid Take 10 mLs by mouth every 4 (four) hours as needed for cough.    [provider]  ibuprofen (ADVIL,MOTRIN) 200 MG tablet Take 200 mg by mouth every 4 (four) hours as needed for mild pain or moderate pain.     [provider]  magnesium hydroxide (MILK OF MAGNESIA) 400 MG/5ML suspension Take 30 mLs  by mouth daily as needed for mild constipation or moderate constipation.    [provider]  nystatin (MYCOSTATIN/NYSTOP) powder Apply 1 Bottle topically 4 (four) times daily as needed (rash).     [provider]  ondansetron (ZOFRAN) 4 MG tablet Take 4 mg by mouth 3 (three) times daily as needed for nausea or vomiting.     [provider]  Polyethyl Glycol-Propyl Glycol 0.4-0.3 % SOLN Place 1 application into both  eyes daily as needed (dry eyes).    [provider]  polyethylene glycol (MIRALAX / GLYCOLAX) packet Take 17 g by mouth every other day.    [provider]  senna-docusate (SENOKOT-S) 8.6-50 MG tablet Take 1 tablet by mouth 2 (two) times daily.    [provider]     Allergies Lorazepam; Sulfa antibiotics; and Tylenol [acetaminophen]   Family History  Problem Relation Age of Onset  . Multiple sclerosis Mother        died @ 51  . Stroke Father        died @ 75  . Heart attack Father   . Lung cancer Sister        died in her 76's    Social History Social History   Tobacco Use  . Smoking status: Former Research scientist (life sciences)  . Smokeless tobacco: Never Used  . Tobacco comment: during high school/college  Substance Use Topics  . Alcohol use: Yes    Comment: whenever he gets the chance (once every few months)  . Drug use: No    Comment: marijuana occ    Review of Systems  Constitutional:   No fever or chills.  ENT:   No sore throat. No rhinorrhea.  Intermittent dysphagia Cardiovascular:   No chest pain or syncope. Respiratory:   No dyspnea or cough. Gastrointestinal:   Negative for abdominal pain, vomiting and diarrhea.  Musculoskeletal:   Negative for focal pain or swelling All other systems reviewed and are negative except as documented above in ROS and HPI.  ____________________________________________   PHYSICAL EXAM:  VITAL SIGNS: ED Triage Vitals  Enc Vitals Group     BP 04/23/18 1700 (!) 177/98     Pulse Rate 04/23/18 1648 98     Resp 04/23/18 1648 16     Temp 04/23/18 1648 98.2 F (36.8 C)     Temp Source 04/23/18 1648 Oral     SpO2 04/23/18 1648 96 %     Weight 04/23/18 1649 170 lb (77.1 kg)     Height 04/23/18 1649 5\' 9"  (1.753 m)     Head Circumference --      Peak Flow --      Pain Score 04/23/18 1649 5     Pain Loc --      Pain Edu? --      Excl. in [place]? --     Vital signs reviewed, nursing assessments  reviewed.   Constitutional:   Alert and oriented. Non-toxic appearance. Eyes:   Conjunctivae are normal. EOMI. PERRL. ENT      Head:   Normocephalic and atraumatic.      Nose:   No congestion/rhinnorhea.       Mouth/Throat:   MMM, no pharyngeal erythema. No peritonsillar mass.       Neck:   No meningismus. Full ROM. Hematological/Lymphatic/Immunilogical:   No cervical lymphadenopathy. Cardiovascular:   RRR. Symmetric bilateral radial and DP pulses.  No murmurs. Cap refill less than 2 seconds. Respiratory:   Normal respiratory effort without tachypnea/retractions.  Breath sounds are clear and equal bilaterally. No wheezes/rales/rhonchi. Gastrointestinal:   Soft and nontender. Non distended. There is no CVA tenderness.  No rebound, rigidity, or guarding. Musculoskeletal:   Normal range of motion in all extremities. No joint effusions.  No lower extremity tenderness.  No edema. Neurologic:   Normal speech and language.  Motor grossly intact. No acute focal neurologic deficits are appreciated.  Skin:    Skin is warm, dry and intact. No rash noted.  No petechiae, purpura, or bullae.  ____________________________________________    LABS (pertinent positives/negatives) (all labs ordered are listed, but only abnormal results are displayed) Labs Reviewed  URINALYSIS, COMPLETE (UACMP) WITH MICROSCOPIC - Abnormal; Notable for the following components:      Result Value   Color, Urine STRAW (*)    APPearance CLEAR (*)    Specific Gravity, Urine 1.002 (*)    Hgb urine dipstick MODERATE (*)    All other components within normal limits  BASIC METABOLIC PANEL - Abnormal; Notable for the following components:   Glucose, Bld 104 (*)    All other components within normal limits  URINE CULTURE  CBC WITH DIFFERENTIAL/PLATELET   ____________________________________________   EKG  Interpreted by me Sinus rhythm rate of 89, normal axis intervals QRS ST segments and T waves.  Baseline artifact  due to his tremor somewhat limits interpretation.  ____________________________________________    LGXQJJHER  Dg Chest Portable 1 View  Result Date: 04/23/2018 CLINICAL DATA:  Dysphagia.  Unable swallow pills. EXAM: PORTABLE CHEST 1 VIEW COMPARISON:  02/13/2018 FINDINGS: The heart size and mediastinal contours are within normal limits. Both lungs are clear. The visualized skeletal structures are unremarkable. IMPRESSION: No active disease. Electronically Signed   By: Earle Gell M.D.   On: 04/23/2018 17:46    ____________________________________________   PROCEDURES Procedures  ____________________________________________    CLINICAL IMPRESSION / ASSESSMENT AND PLAN / ED COURSE  Pertinent labs & imaging results that were available during my care of the patient were reviewed by me and considered in my medical decision making (see chart for details).    Patient presents with dysphasia and feeling like his Parkinson symptoms are worse today.  With his disintegrated with medicine, it seems that he is still been getting his usual dosages, but I will look for pneumonia, urinary tract infection, dehydration or electrolyte disturbances possible causes for symptom exacerbation.  Plan to p.o. trial and give medicines here.  Clinical Course as of Apr 24 1931  Sat Apr 23, 2018  1830 Labs unremarkable, chest x-ray unremarkable without evidence of aspiration.  Follow-up urinalysis.  Will attempt to provide his oral Sinemet if able.   [PS]    Clinical Course User Index [PS] Carrie Mew, MD    ----------------------------------------- 7:36 PM on 04/23/2018 -----------------------------------------  Work-up all unremarkable.  Was able to take his medicines and drink some juice here in the ED.  We will plan to discharge home, follow-up with his neurologist in 2 days on Monday.   ____________________________________________   FINAL CLINICAL IMPRESSION(S) / ED DIAGNOSES    Final  diagnoses:  Parkinson's disease ([place])  Dysphagia, unspecified type     ED Discharge Orders    None      Portions of this note were generated with dragon dictation software. Dictation errors may occur despite best attempts at proofreading.   Carrie Mew, MD 04/23/18 [phone]

## 2018-04-23 NOTE — ED Notes (Signed)
Called [facility] for transport to [facility]

## 2018-04-23 NOTE — ED Notes (Signed)
E-signature not working at this time. D/C instructions and follow up care instructions given to pt and facility RN Jessica. Pt and nurse verbalized understanding of D/C instructions. No further questions at this time. Pt in NAD. This RN Ship broker of transportation need. This RN will continue to monitor pt while waiting for transport.

## 2018-04-25 LAB — URINE CULTURE: CULTURE: NO GROWTH

## 2018-04-28 ENCOUNTER — Encounter: Payer: Self-pay | Admitting: Internal Medicine

## 2018-04-28 ENCOUNTER — Ambulatory Visit: Payer: Medicare Other | Admitting: Internal Medicine

## 2018-04-28 VITALS — BP 105/67 | HR 66 | Resp 18 | Wt 168.4 lb

## 2018-04-28 DIAGNOSIS — I10 Essential (primary) hypertension: Secondary | ICD-10-CM

## 2018-04-28 DIAGNOSIS — R32 Unspecified urinary incontinence: Secondary | ICD-10-CM | POA: Diagnosis not present

## 2018-04-28 DIAGNOSIS — G20 Parkinson's disease: Secondary | ICD-10-CM | POA: Diagnosis not present

## 2018-04-28 DIAGNOSIS — F0280 Dementia in other diseases classified elsewhere without behavioral disturbance: Secondary | ICD-10-CM

## 2018-04-28 DIAGNOSIS — G20A1 Parkinson's disease without dyskinesia, without mention of fluctuations: Secondary | ICD-10-CM

## 2018-04-28 DIAGNOSIS — K219 Gastro-esophageal reflux disease without esophagitis: Secondary | ICD-10-CM

## 2018-04-28 NOTE — Progress Notes (Signed)
Subjective:    Patient ID: Lucas Edwards, male    DOB: 06-17-1950, 68 y.o.   MRN: [id number]  HPI Visit in [facility] room for review of chronic health conditions Reviewed status with [name] RN Reviewed recent ER visits  He states the nurses prompted his ER visits He reports trouble swallowing---even sometimes his pills Also had an unresponsive spell --nothing on the work up [employer] to see Dr Claiborne Billings and increased sinemet (also getting night doses) Going to [facility] to look into possibility of a pump He notes 30-45 minute delay before his meds kick in He thinks it may be some better since the dose change  Ongoing mild cognitive issues Staff supervise medication Walks with rollator--for control and stability Still has aides to assist with showering and dressing  Still with incontinence mostly at night No longer with condom catheter---just uses brief Daytime urgency but usually continent  No heartburn on PPI Dysphagia seems to be related to the Parkinsons  No chest pain---just gets abdominal distention that goes into his chest sometimes Gets sense of SOB at times---better if he breathes through nose Occasional lightheadedness  Current Outpatient Medications on File Prior to Visit  Medication Sig Dispense Refill  . carbidopa-levodopa (SINEMET IR) 25-100 MG tablet Take 1 tablet by mouth 3 (three) times daily. And 2 tabs 5 times per day    . docusate sodium (COLACE) 100 MG capsule Take 100 mg by mouth 4 (four) times daily.     Marland Kitchen donepezil (ARICEPT) 10 MG tablet Take 10 mg by mouth every evening.     . entacapone (COMTAN) 200 MG tablet Take 200 mg by mouth 4 (four) times daily.    . pantoprazole (PROTONIX) 40 MG tablet Take 40 mg by mouth daily.     Vladimir Faster Glycol-Propyl Glycol 0.4-0.3 % SOLN Place 1 application into both eyes daily as needed (dry eyes).    . polyethylene glycol (MIRALAX / GLYCOLAX) packet Take 17 g by mouth every other day.    . senna-docusate (SENOKOT-S)  8.6-50 MG tablet Take 1 tablet by mouth 2 (two) times daily.     No current facility-administered medications on file prior to visit.     Allergies  Allergen Reactions  . Lorazepam Other (See Comments)    confusion  . Sulfa Antibiotics Hives  . Tylenol [Acetaminophen] Itching    Past Medical History:  Diagnosis Date  . Abdominal hernia   . 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 Essential hypertension, benign   . GERD (gastroesophageal reflux disease)   . Hyperlipidemia   . Parkinson disease ([place])   . Urinary incontinence     Past Surgical History:  Procedure Laterality Date  . HIP ARTHROPLASTY Left 08/06/2017   Procedure: ARTHROPLASTY BIPOLAR HIP (HEMIARTHROPLASTY);  Surgeon: Corky Mull, MD;  Location: [facility] ORS;  Service: Orthopedics;  Laterality: Left;  . INGUINAL HERNIA REPAIR Bilateral   . VASECTOMY      Family History  Problem Relation Age of Onset  . Multiple sclerosis Mother        died @ 60  . Stroke Father        died @ 67  . Heart attack Father   . Lung cancer Sister        died in her 64's    Social History   Socioeconomic History  . Marital status: Widowed    Spouse name: Not on file  . Number of children: 1  . Years of education: Not on  file  . Highest education level: Not on file  Occupational History  . Occupation: Clinical cytogeneticist    Comment: Disabled/retired  Social Needs  . Financial resource strain: Not on file  . Food insecurity:    Worry: Not on file    Inability: Not on file  . Transportation needs:    Medical: Not on file    Non-medical: Not on file  Tobacco Use  . Smoking status: Former Research scientist (life sciences)  . Smokeless tobacco: Never Used  . Tobacco comment: during high school/college  Substance and Sexual Activity  . Alcohol use: Yes    Comment: whenever he gets the chance (once every few months)  . Drug use: No    Comment: marijuana occ  . Sexual activity: Never  Lifestyle  . Physical activity:    Days per week:  Not on file    Minutes per session: Not on file  . Stress: Not on file  Relationships  . Social connections:    Talks on phone: Not on file    Gets together: Not on file    Attends religious service: Not on file    Active member of club or organization: Not on file    Attends meetings of clubs or organizations: Not on file    Relationship status: Not on file  . Intimate partner violence:    Fear of current or ex partner: Not on file    Emotionally abused: Not on file    Physically abused: Not on file    Forced sexual activity: Not on file  Other Topics Concern  . Not on file  Social History Narrative   Divorced then widowed   1 stepdaughter      Has living will   Niece [name] is health care POA   Has DNR   Would accept hospital   No extended tube feedings   Review of Systems Dry mouth---better if he wears a mask Still some constipation---small amount daily Sleeps okay but still falls asleep sitting or standing at times during the day. Sleeps in recliner    Objective:   Physical Exam  Constitutional: He appears well-developed. No distress.  Neck: No thyromegaly present.  Cardiovascular: Normal rate, regular rhythm and normal heart sounds. Exam reveals no gallop.  No murmur heard. Respiratory: Effort normal and breath sounds normal. No respiratory distress. He has no wheezes. He has no rales.  GI: Soft. There is no abdominal tenderness.  Musculoskeletal:        General: No tenderness or edema.  Lymphadenopathy:    He has no cervical adenopathy.  Neurological:  Mild tremor and bradykinesia Gait reasonably good now (45 minutes after sinemet)  Psychiatric: He has a normal mood and affect.           Assessment & Plan:

## 2018-04-28 NOTE — Assessment & Plan Note (Signed)
Recent ER visits seem to be related to apparent wearing off of his sinemet---may be some better with the dose adjustment that Dr Melrose Nakayama has made Awaiting evaluation for pump for more consistent drug levels

## 2018-04-28 NOTE — Assessment & Plan Note (Signed)
Mild but doing okay in AL setting On the donepezil

## 2018-04-28 NOTE — Assessment & Plan Note (Signed)
BP Readings from Last 3 Encounters:  04/28/18 105/67  04/23/18 (!) 186/91  04/06/18 (!) 172/107   Has been fine now No meds

## 2018-04-28 NOTE — Assessment & Plan Note (Signed)
Does okay with PPI No dysphagia when sinemet in his system

## 2018-04-28 NOTE — Assessment & Plan Note (Signed)
Now just wearing incontinence briefs Mostly just at night

## 2018-05-19 ENCOUNTER — Ambulatory Visit
Admission: RE | Admit: 2018-05-19 | Discharge: 2018-05-19 | Disposition: A | Discharge status: Home / Self Care | Payer: Medicare Other | Source: Ambulatory Visit | Attending: Neurology | Admitting: Neurology

## 2018-05-19 DIAGNOSIS — R1310 Dysphagia, unspecified: Secondary | ICD-10-CM | POA: Diagnosis not present

## 2018-05-19 DIAGNOSIS — R1314 Dysphagia, pharyngoesophageal phase: Secondary | ICD-10-CM | POA: Diagnosis not present

## 2018-05-19 NOTE — Therapy (Addendum)
[place], [place], [address] Phone: [phone]   Fax:     Modified Barium Swallow  Patient Details  Name: Lucas Edwards MRN: [id number] Date of Birth: 09/09/1950 No data recorded  Encounter Date: 05/19/2018  End of Session - 05/19/18 1452    Visit Number  1    Number of Visits  1    Date for SLP Re-Evaluation  05/19/18    SLP Start Time  1245    SLP Stop Time   1350    SLP Time Calculation (min)  65 min    Activity Tolerance  Patient tolerated treatment well       Past Medical History:  Diagnosis Date  . Abdominal hernia   . 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 Essential hypertension, benign   . GERD (gastroesophageal reflux disease)   . Hyperlipidemia   . Parkinson disease ([place])   . Urinary incontinence     Past Surgical History:  Procedure Laterality Date  . HIP ARTHROPLASTY Left 08/06/2017   Procedure: ARTHROPLASTY BIPOLAR HIP (HEMIARTHROPLASTY);  Surgeon: Corky Mull, MD;  Location: [facility] ORS;  Service: Orthopedics;  Laterality: Left;  . INGUINAL HERNIA REPAIR Bilateral   . VASECTOMY      There were no vitals filed for this visit.       Subjective: Patient behavior: (alertness, ability to follow instructions, etc.): pt was verbally engaging; followed instructions. He thoroughly described medical issues including Reflux s/s, difficulty swallowing Pills, and discomfort w/ Abdominal bloating and chest fullness. Noted abdominal hernia repair. Pt stated h/o Esophageal Dilitation ~3-4 yrs ago. Pt has a baseline dx of Parkinson's Disease w/ associated Dementia. He has a baseline dx of GERD on a PPI. Pt was able to verbalize and describe his eating habits and difficulties he felt he had when swallowing Pills; GI c/o discomfort. He is on a Regular diet currently. He resides at [facility].  Chief complaint: dysphagia. Native Dentition; OM exam grossly [facility] w/ min  lingual movement during protrusion; Speech clear; Volume adequate; slower U/LE motor movements - pt reported he was tactile sensitive even feeling the "weight" of clothing on him as well as the various sensations of food/liquid in his mouth.   Objective:  Radiological Procedure: A videoflouroscopic evaluation of oral-preparatory, reflex initiation, and pharyngeal phases of the swallow was performed; as well as a screening of the upper esophageal phase.  I. POSTURE: upright  II. VIEW: lateral III. COMPENSATORY STRATEGIES: use of a puree when swallowing a Pill; time b/t bites/sips to allow for Esophageal clearing IV. BOLUSES ADMINISTERED:  Thin Liquid: 4 trials   Nectar-thick Liquid: 1 trial  Honey-thick Liquid: NT  Puree: 2 trials   Mechanical Soft: 2 trials  Barium tablet (in tsp of puree): 1 trial V. RESULTS OF EVALUATION: A. ORAL PREPARATORY PHASE: (The lips, tongue, and velum are observed for strength and coordination)       **Overall Severity Rating: Moderate+. Pt exhibited increased oral prep/management time (lingual pumping and manipulation movements) resulting in increased oral phase time - this was noted w/ all trial consistencies including liquids. Oral phase time was between ~6-10 seconds w/ liquids; ~15 seconds w/ increased textures(foods). Bolus cohesion and control noted w/ no significant premature spillage occurring. Full oral clearing achievied. Noted pt's head forward/downward posture at baseline - stating it was "difficult" to hold his head up. Suspect this is d/t the Parkinson's Disease.   B. SWALLOW INITIATION/REFLEX: (  The reflex is normal if "triggered" by the time the bolus reached the base of the tongue)  **Overall Severity Rating: grossly WFL. Timing of the pharyngeal swallow appeared grossly [facility] for all trial consistencies; swallow initiation noted b/t the BOT and Valleculae w/ the majority of trials during exam.  C. PHARYNGEAL PHASE: (Pharyngeal function is normal if  the bolus shows rapid, smooth, and continuous transit through the pharynx and there is no pharyngeal residue after the swallow)  **Overall Severity Rating: [facility]. Pharyngeal clearing noted w/ all food/liquid trial consistencies; no obvious pharyngeal residue remained post swallow indicating adequate laryngeal excursion and pharyngeal pressure during the swallowing. Of note, when the Barium tablet was presented Whole in a TSP of Puree, it remained in the Valleculae. F/u tsps of puree and sip of liquid aided in dislodging it for it to pass into the Esophagus. It was NOT viewable in the Upper Esophagus though pt stated he "still felt it" pointing to his mid-Sternum area up to 10 mins later when leaving. Suspect GI dysmotility; pt has a h/o such w/ Esophageal Dilitation per his report.   D. LARYNGEAL PENETRATION: (Material entering into the laryngeal inlet/vestibule but not aspirated): NONE E. ASPIRATION: NONE F. ESOPHAGEAL PHASE: (Screening of the upper esophagus): min slower clearing of the (viewable) upper Esophagus below the UES. Time was given b/t trials for pt to utilize a f/u, dry swallow to aid motility and clearing.   ASSESSMENT: Pt appears to present w/ Moderate Oral phase Dysphagia suspect directly impacted by his baseline dx of Parkinson's Disease; NO significant Pharyngeal phase dysphagia noted during this exam. Pt does appear to present w/ Esophageal phase Dysmotility; does have a baseline h/o GERD, and Esophageal Dilitation ~3-4 yrs ago(per his report). NO laryngeal penetration and NO aspiration occurred during this exam today. Pt did exhibit difficulty fully clearing a Barium Tablet through the pharynx but given time and alternating w/ food/liquid, it passed into the Esophagus. This is a baseline issue for pt, and per his report, he uses various strategies to help himself swallow his Pills including "chewing candy corn first to achieve a sugary coating to help a pill go down". During the Oral phase,  pt exhibited increased oral prep/management time (lingual pumping and manipulation movements) resulting in increased oral phase time - this was noted w/ all trial consistencies including liquids. Oral phase time was between ~6-10 seconds w/ liquids; ~15 seconds w/ increased textures(foods). Full bolus cohesion and control noted w/ no significant premature spillage occurring. Full oral clearing achieved w/ all trial consistencies. Noted pt's head forward/downward posture at baseline(which could impact A-P transit motility somewhat) - he stated it was "difficult" to hold his head up. Suspect this is d/t the Parkinson's Disease. During the Pharyngeal phase, timing of the pharyngeal swallow appeared grossly [facility] for all trial consistencies; swallow initiation noted b/t the BOT and Valleculae w/ the majority of trials during exam. NO aspiration and NO laryngeal penetration occurred during this exam. Pharyngeal clearing noted w/ all food/liquid trial consistencies; no obvious pharyngeal residue remained post swallow indicating adequate laryngeal excursion and pharyngeal pressure during the swallowing. Of note, when the Barium tablet was presented Whole in a TSP of Puree, it remained in the Valleculae. F/u tsps of puree and sip of liquid aided in dislodging it for it to pass into the Esophagus. It was NOT viewable in the Upper Esophagus though pt stated he "still felt it" pointing to his mid-Sternum area up to 10 mins later when leaving. Suspect GI  dysmotility; pt has a h/o such w/ Esophageal Dilitation per his report. During the exam, time was given b/t trials for pt to utilize a f/u, dry swallow to aid Esophageal motility and clearing.  PLAN/RECOMMENDATIONS:  A. Diet: continue current Regular consistency diet w/ Thin liquids; cut meats/foods well; moistened meats/foods well. Continue to use known strategies to aid Pill swallowing OR discuss w/ MD/Pharmacist the option of Chewable pills, Liquid forms, or Crushing pills  and mixing in a puree.   B. Swallowing Precautions: general aspiration precautions; REFLUX precautions  C. Recommended consultation to: GI for f/u assessment and management of any Esophageal dysmotility d/t his c/o s/s - pt has a h/o Esophageal dilitation ~3-4 yrs ago  D. Therapy recommendations: None for swallowing; potential PT intervention for posture/neck strengthening  E. Results and recommendations were discussed w/ patient; video viewed; questions answered; recommendations discussed           Dysphagia, pharyngoesophageal phase  Dysphagia, unspecified type - Plan: DG SWALLOW FUNC W VID CINE [place] NECK DELAYED IMAGE WITH BA MEDICARE, DG SWALLOW FUNC W VID CINE [place] NECK DELAYED IMAGE WITH BA MEDICARE        Problem List Patient Active Problem List   Diagnosis Date Noted  . GERD (gastroesophageal reflux disease) 02/26/2018  . Essential hypertension, benign   . Parkinson disease ([place])   . 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 Urinary incontinence   . REM behavioral disorder 10/24/2013      Orinda Kenner, MS, CCC-SLP Watson,Katherine 05/19/2018, 2:53 PM   [facility] [place], [place], [address] Phone: [phone]   Fax:     Name: Lucas Edwards MRN: [id number] Date of Birth: [birth year]

## 2018-05-25 DIAGNOSIS — G245 Blepharospasm: Secondary | ICD-10-CM | POA: Diagnosis not present

## 2018-05-25 DIAGNOSIS — Z888 Allergy status to other drugs, medicaments and biological substances status: Secondary | ICD-10-CM | POA: Diagnosis not present

## 2018-05-25 DIAGNOSIS — Z882 Allergy status to sulfonamides status: Secondary | ICD-10-CM | POA: Diagnosis not present

## 2018-05-25 DIAGNOSIS — G20 Parkinson's disease: Secondary | ICD-10-CM | POA: Diagnosis not present

## 2018-05-25 DIAGNOSIS — Z886 Allergy status to analgesic agent status: Secondary | ICD-10-CM | POA: Diagnosis not present

## 2018-05-25 DIAGNOSIS — F0280 Dementia in other diseases classified elsewhere without behavioral disturbance: Secondary | ICD-10-CM | POA: Insufficient documentation

## 2018-05-25 DIAGNOSIS — Z79899 Other long term (current) drug therapy: Secondary | ICD-10-CM | POA: Diagnosis not present

## 2018-05-25 DIAGNOSIS — R32 Unspecified urinary incontinence: Secondary | ICD-10-CM | POA: Insufficient documentation

## 2018-05-25 DIAGNOSIS — R4189 Other symptoms and signs involving cognitive functions and awareness: Secondary | ICD-10-CM | POA: Diagnosis not present

## 2018-05-25 DIAGNOSIS — I10 Essential (primary) hypertension: Secondary | ICD-10-CM | POA: Insufficient documentation

## 2018-05-27 DIAGNOSIS — G20 Parkinson's disease: Secondary | ICD-10-CM | POA: Diagnosis not present

## 2018-05-27 DIAGNOSIS — Z886 Allergy status to analgesic agent status: Secondary | ICD-10-CM | POA: Diagnosis not present

## 2018-05-27 DIAGNOSIS — Z888 Allergy status to other drugs, medicaments and biological substances status: Secondary | ICD-10-CM | POA: Diagnosis not present

## 2018-05-27 DIAGNOSIS — Z882 Allergy status to sulfonamides status: Secondary | ICD-10-CM | POA: Diagnosis not present

## 2018-05-27 DIAGNOSIS — R1312 Dysphagia, oropharyngeal phase: Secondary | ICD-10-CM | POA: Diagnosis not present

## 2018-05-27 DIAGNOSIS — K219 Gastro-esophageal reflux disease without esophagitis: Secondary | ICD-10-CM | POA: Diagnosis not present

## 2018-05-30 DIAGNOSIS — L570 Actinic keratosis: Secondary | ICD-10-CM | POA: Diagnosis not present

## 2018-05-30 DIAGNOSIS — L578 Other skin changes due to chronic exposure to nonionizing radiation: Secondary | ICD-10-CM | POA: Diagnosis not present

## 2018-05-30 DIAGNOSIS — L720 Epidermal cyst: Secondary | ICD-10-CM | POA: Diagnosis not present

## 2018-05-30 DIAGNOSIS — L812 Freckles: Secondary | ICD-10-CM | POA: Diagnosis not present

## 2018-05-30 DIAGNOSIS — L821 Other seborrheic keratosis: Secondary | ICD-10-CM | POA: Diagnosis not present

## 2018-05-30 DIAGNOSIS — D1800 Hemangioma unspecified site: Secondary | ICD-10-CM | POA: Diagnosis not present

## 2018-07-26 DIAGNOSIS — F0280 Dementia in other diseases classified elsewhere without behavioral disturbance: Secondary | ICD-10-CM | POA: Diagnosis not present

## 2018-07-26 DIAGNOSIS — K219 Gastro-esophageal reflux disease without esophagitis: Secondary | ICD-10-CM | POA: Diagnosis not present

## 2018-07-26 DIAGNOSIS — G20 Parkinson's disease: Secondary | ICD-10-CM | POA: Diagnosis not present

## 2018-07-26 DIAGNOSIS — R262 Difficulty in walking, not elsewhere classified: Secondary | ICD-10-CM | POA: Diagnosis not present

## 2018-07-26 DIAGNOSIS — I10 Essential (primary) hypertension: Secondary | ICD-10-CM | POA: Diagnosis not present

## 2018-07-26 DIAGNOSIS — R4189 Other symptoms and signs involving cognitive functions and awareness: Secondary | ICD-10-CM | POA: Diagnosis not present

## 2018-07-26 DIAGNOSIS — M6281 Muscle weakness (generalized): Secondary | ICD-10-CM | POA: Diagnosis not present

## 2018-07-26 DIAGNOSIS — Z741 Need for assistance with personal care: Secondary | ICD-10-CM | POA: Diagnosis not present

## 2018-07-26 DIAGNOSIS — R32 Unspecified urinary incontinence: Secondary | ICD-10-CM | POA: Diagnosis not present

## 2018-07-26 DIAGNOSIS — R2681 Unsteadiness on feet: Secondary | ICD-10-CM | POA: Diagnosis not present

## 2018-07-26 DIAGNOSIS — Z9181 History of falling: Secondary | ICD-10-CM | POA: Diagnosis not present

## 2018-07-27 DIAGNOSIS — R32 Unspecified urinary incontinence: Secondary | ICD-10-CM | POA: Diagnosis not present

## 2018-07-27 DIAGNOSIS — R2681 Unsteadiness on feet: Secondary | ICD-10-CM | POA: Diagnosis not present

## 2018-07-27 DIAGNOSIS — M6281 Muscle weakness (generalized): Secondary | ICD-10-CM | POA: Diagnosis not present

## 2018-07-27 DIAGNOSIS — Z9181 History of falling: Secondary | ICD-10-CM | POA: Diagnosis not present

## 2018-07-27 DIAGNOSIS — I10 Essential (primary) hypertension: Secondary | ICD-10-CM | POA: Diagnosis not present

## 2018-07-27 DIAGNOSIS — R262 Difficulty in walking, not elsewhere classified: Secondary | ICD-10-CM | POA: Diagnosis not present

## 2018-07-28 DIAGNOSIS — I10 Essential (primary) hypertension: Secondary | ICD-10-CM | POA: Diagnosis not present

## 2018-07-28 DIAGNOSIS — M6281 Muscle weakness (generalized): Secondary | ICD-10-CM | POA: Diagnosis not present

## 2018-07-28 DIAGNOSIS — R262 Difficulty in walking, not elsewhere classified: Secondary | ICD-10-CM | POA: Diagnosis not present

## 2018-07-28 DIAGNOSIS — R2681 Unsteadiness on feet: Secondary | ICD-10-CM | POA: Diagnosis not present

## 2018-07-28 DIAGNOSIS — R32 Unspecified urinary incontinence: Secondary | ICD-10-CM | POA: Diagnosis not present

## 2018-07-28 DIAGNOSIS — Z9181 History of falling: Secondary | ICD-10-CM | POA: Diagnosis not present

## 2018-07-29 DIAGNOSIS — S7292XD Unspecified fracture of left femur, subsequent encounter for closed fracture with routine healing: Secondary | ICD-10-CM | POA: Diagnosis not present

## 2018-08-01 DIAGNOSIS — G20 Parkinson's disease: Secondary | ICD-10-CM | POA: Diagnosis not present

## 2018-08-01 DIAGNOSIS — S7292XD Unspecified fracture of left femur, subsequent encounter for closed fracture with routine healing: Secondary | ICD-10-CM | POA: Diagnosis not present

## 2018-08-02 DIAGNOSIS — S7292XD Unspecified fracture of left femur, subsequent encounter for closed fracture with routine healing: Secondary | ICD-10-CM | POA: Diagnosis not present

## 2018-08-03 DIAGNOSIS — S7292XD Unspecified fracture of left femur, subsequent encounter for closed fracture with routine healing: Secondary | ICD-10-CM | POA: Diagnosis not present

## 2018-08-04 DIAGNOSIS — S7292XD Unspecified fracture of left femur, subsequent encounter for closed fracture with routine healing: Secondary | ICD-10-CM | POA: Diagnosis not present

## 2018-08-05 DIAGNOSIS — S7292XD Unspecified fracture of left femur, subsequent encounter for closed fracture with routine healing: Secondary | ICD-10-CM | POA: Diagnosis not present

## 2018-08-08 DIAGNOSIS — S7292XD Unspecified fracture of left femur, subsequent encounter for closed fracture with routine healing: Secondary | ICD-10-CM | POA: Diagnosis not present

## 2018-08-09 DIAGNOSIS — G20 Parkinson's disease: Secondary | ICD-10-CM | POA: Diagnosis not present

## 2018-08-09 DIAGNOSIS — S7292XD Unspecified fracture of left femur, subsequent encounter for closed fracture with routine healing: Secondary | ICD-10-CM | POA: Diagnosis not present

## 2018-08-09 DIAGNOSIS — G3183 Dementia with Lewy bodies: Secondary | ICD-10-CM | POA: Diagnosis not present

## 2018-08-10 DIAGNOSIS — S7292XD Unspecified fracture of left femur, subsequent encounter for closed fracture with routine healing: Secondary | ICD-10-CM | POA: Diagnosis not present

## 2018-08-11 DIAGNOSIS — S7292XD Unspecified fracture of left femur, subsequent encounter for closed fracture with routine healing: Secondary | ICD-10-CM | POA: Diagnosis not present

## 2018-08-12 DIAGNOSIS — S7292XD Unspecified fracture of left femur, subsequent encounter for closed fracture with routine healing: Secondary | ICD-10-CM | POA: Diagnosis not present

## 2018-08-15 DIAGNOSIS — S7292XD Unspecified fracture of left femur, subsequent encounter for closed fracture with routine healing: Secondary | ICD-10-CM | POA: Diagnosis not present

## 2018-08-18 DIAGNOSIS — S7292XD Unspecified fracture of left femur, subsequent encounter for closed fracture with routine healing: Secondary | ICD-10-CM | POA: Diagnosis not present

## 2018-08-23 DIAGNOSIS — S7292XD Unspecified fracture of left femur, subsequent encounter for closed fracture with routine healing: Secondary | ICD-10-CM | POA: Diagnosis not present

## 2018-08-24 DIAGNOSIS — S7292XD Unspecified fracture of left femur, subsequent encounter for closed fracture with routine healing: Secondary | ICD-10-CM | POA: Diagnosis not present

## 2018-08-25 DIAGNOSIS — F39 Unspecified mood [affective] disorder: Secondary | ICD-10-CM

## 2018-08-25 DIAGNOSIS — F0280 Dementia in other diseases classified elsewhere without behavioral disturbance: Secondary | ICD-10-CM | POA: Diagnosis not present

## 2018-08-25 DIAGNOSIS — S7292XD Unspecified fracture of left femur, subsequent encounter for closed fracture with routine healing: Secondary | ICD-10-CM | POA: Diagnosis not present

## 2018-08-25 DIAGNOSIS — K219 Gastro-esophageal reflux disease without esophagitis: Secondary | ICD-10-CM | POA: Diagnosis not present

## 2018-08-25 DIAGNOSIS — G20 Parkinson's disease: Secondary | ICD-10-CM | POA: Diagnosis not present

## 2018-08-25 DIAGNOSIS — I10 Essential (primary) hypertension: Secondary | ICD-10-CM | POA: Diagnosis not present

## 2018-08-26 DIAGNOSIS — S7292XD Unspecified fracture of left femur, subsequent encounter for closed fracture with routine healing: Secondary | ICD-10-CM | POA: Diagnosis not present

## 2018-08-29 DIAGNOSIS — S7292XD Unspecified fracture of left femur, subsequent encounter for closed fracture with routine healing: Secondary | ICD-10-CM | POA: Diagnosis not present

## 2018-08-30 DIAGNOSIS — S7292XD Unspecified fracture of left femur, subsequent encounter for closed fracture with routine healing: Secondary | ICD-10-CM | POA: Diagnosis not present

## 2018-08-31 DIAGNOSIS — S7292XD Unspecified fracture of left femur, subsequent encounter for closed fracture with routine healing: Secondary | ICD-10-CM | POA: Diagnosis not present

## 2018-09-01 DIAGNOSIS — S7292XD Unspecified fracture of left femur, subsequent encounter for closed fracture with routine healing: Secondary | ICD-10-CM | POA: Diagnosis not present

## 2018-09-02 DIAGNOSIS — S7292XD Unspecified fracture of left femur, subsequent encounter for closed fracture with routine healing: Secondary | ICD-10-CM | POA: Diagnosis not present

## 2018-09-03 DIAGNOSIS — S7292XD Unspecified fracture of left femur, subsequent encounter for closed fracture with routine healing: Secondary | ICD-10-CM | POA: Diagnosis not present

## 2018-09-05 DIAGNOSIS — S7292XD Unspecified fracture of left femur, subsequent encounter for closed fracture with routine healing: Secondary | ICD-10-CM | POA: Diagnosis not present

## 2018-09-07 DIAGNOSIS — S7292XD Unspecified fracture of left femur, subsequent encounter for closed fracture with routine healing: Secondary | ICD-10-CM | POA: Diagnosis not present

## 2018-09-08 DIAGNOSIS — F39 Unspecified mood [affective] disorder: Secondary | ICD-10-CM | POA: Diagnosis not present

## 2018-09-08 DIAGNOSIS — F0280 Dementia in other diseases classified elsewhere without behavioral disturbance: Secondary | ICD-10-CM | POA: Diagnosis not present

## 2018-09-08 DIAGNOSIS — K219 Gastro-esophageal reflux disease without esophagitis: Secondary | ICD-10-CM | POA: Diagnosis not present

## 2018-09-08 DIAGNOSIS — G20 Parkinson's disease: Secondary | ICD-10-CM | POA: Diagnosis not present

## 2018-09-08 DIAGNOSIS — S7292XD Unspecified fracture of left femur, subsequent encounter for closed fracture with routine healing: Secondary | ICD-10-CM | POA: Diagnosis not present

## 2018-09-09 DIAGNOSIS — S7292XD Unspecified fracture of left femur, subsequent encounter for closed fracture with routine healing: Secondary | ICD-10-CM | POA: Diagnosis not present

## 2018-09-12 DIAGNOSIS — S7292XD Unspecified fracture of left femur, subsequent encounter for closed fracture with routine healing: Secondary | ICD-10-CM | POA: Diagnosis not present

## 2018-09-13 DIAGNOSIS — S7292XD Unspecified fracture of left femur, subsequent encounter for closed fracture with routine healing: Secondary | ICD-10-CM | POA: Diagnosis not present

## 2018-09-14 DIAGNOSIS — S7292XD Unspecified fracture of left femur, subsequent encounter for closed fracture with routine healing: Secondary | ICD-10-CM | POA: Diagnosis not present

## 2018-09-14 DIAGNOSIS — Z1159 Encounter for screening for other viral diseases: Secondary | ICD-10-CM | POA: Diagnosis not present

## 2018-09-15 DIAGNOSIS — S7292XD Unspecified fracture of left femur, subsequent encounter for closed fracture with routine healing: Secondary | ICD-10-CM | POA: Diagnosis not present

## 2018-09-16 DIAGNOSIS — S7292XD Unspecified fracture of left femur, subsequent encounter for closed fracture with routine healing: Secondary | ICD-10-CM | POA: Diagnosis not present

## 2018-09-19 DIAGNOSIS — S7292XD Unspecified fracture of left femur, subsequent encounter for closed fracture with routine healing: Secondary | ICD-10-CM | POA: Diagnosis not present

## 2018-09-20 DIAGNOSIS — S7292XD Unspecified fracture of left femur, subsequent encounter for closed fracture with routine healing: Secondary | ICD-10-CM | POA: Diagnosis not present

## 2018-09-23 DIAGNOSIS — S7292XD Unspecified fracture of left femur, subsequent encounter for closed fracture with routine healing: Secondary | ICD-10-CM | POA: Diagnosis not present

## 2018-09-27 DIAGNOSIS — B351 Tinea unguium: Secondary | ICD-10-CM | POA: Diagnosis not present

## 2018-10-17 DIAGNOSIS — Z03818 Encounter for observation for suspected exposure to other biological agents ruled out: Secondary | ICD-10-CM | POA: Diagnosis not present

## 2018-10-19 DIAGNOSIS — F39 Unspecified mood [affective] disorder: Secondary | ICD-10-CM | POA: Diagnosis not present

## 2018-10-19 DIAGNOSIS — F0150 Vascular dementia without behavioral disturbance: Secondary | ICD-10-CM | POA: Diagnosis not present

## 2018-10-19 DIAGNOSIS — G3183 Dementia with Lewy bodies: Secondary | ICD-10-CM | POA: Diagnosis not present

## 2018-10-19 DIAGNOSIS — K219 Gastro-esophageal reflux disease without esophagitis: Secondary | ICD-10-CM | POA: Diagnosis not present

## 2018-10-24 DIAGNOSIS — B370 Candidal stomatitis: Secondary | ICD-10-CM | POA: Diagnosis not present

## 2018-10-25 DIAGNOSIS — B342 Coronavirus infection, unspecified: Secondary | ICD-10-CM | POA: Diagnosis not present

## 2018-11-07 DIAGNOSIS — G20 Parkinson's disease: Secondary | ICD-10-CM | POA: Diagnosis not present

## 2018-11-08 DIAGNOSIS — G20 Parkinson's disease: Secondary | ICD-10-CM | POA: Diagnosis not present

## 2018-11-09 DIAGNOSIS — G20 Parkinson's disease: Secondary | ICD-10-CM | POA: Diagnosis not present

## 2018-11-10 DIAGNOSIS — F0280 Dementia in other diseases classified elsewhere without behavioral disturbance: Secondary | ICD-10-CM

## 2018-11-10 DIAGNOSIS — G20 Parkinson's disease: Secondary | ICD-10-CM | POA: Diagnosis not present

## 2018-11-10 DIAGNOSIS — F39 Unspecified mood [affective] disorder: Secondary | ICD-10-CM

## 2018-11-10 DIAGNOSIS — K219 Gastro-esophageal reflux disease without esophagitis: Secondary | ICD-10-CM

## 2018-11-11 DIAGNOSIS — G20 Parkinson's disease: Secondary | ICD-10-CM | POA: Diagnosis not present

## 2018-11-14 DIAGNOSIS — G20 Parkinson's disease: Secondary | ICD-10-CM | POA: Diagnosis not present

## 2018-11-15 DIAGNOSIS — G20 Parkinson's disease: Secondary | ICD-10-CM | POA: Diagnosis not present

## 2018-11-16 DIAGNOSIS — G20 Parkinson's disease: Secondary | ICD-10-CM | POA: Diagnosis not present

## 2018-11-17 DIAGNOSIS — G20 Parkinson's disease: Secondary | ICD-10-CM | POA: Diagnosis not present

## 2018-11-18 DIAGNOSIS — G20 Parkinson's disease: Secondary | ICD-10-CM | POA: Diagnosis not present

## 2018-11-21 DIAGNOSIS — G20 Parkinson's disease: Secondary | ICD-10-CM | POA: Diagnosis not present

## 2018-11-22 DIAGNOSIS — G20 Parkinson's disease: Secondary | ICD-10-CM | POA: Diagnosis not present

## 2018-11-23 DIAGNOSIS — G20 Parkinson's disease: Secondary | ICD-10-CM | POA: Diagnosis not present

## 2018-12-02 DIAGNOSIS — Z03818 Encounter for observation for suspected exposure to other biological agents ruled out: Secondary | ICD-10-CM | POA: Diagnosis not present

## 2018-12-07 DIAGNOSIS — Z03818 Encounter for observation for suspected exposure to other biological agents ruled out: Secondary | ICD-10-CM | POA: Diagnosis not present

## 2018-12-30 DIAGNOSIS — Z03818 Encounter for observation for suspected exposure to other biological agents ruled out: Secondary | ICD-10-CM | POA: Diagnosis not present

## 2019-01-10 DIAGNOSIS — Z03818 Encounter for observation for suspected exposure to other biological agents ruled out: Secondary | ICD-10-CM | POA: Diagnosis not present

## 2019-01-13 DIAGNOSIS — Z03818 Encounter for observation for suspected exposure to other biological agents ruled out: Secondary | ICD-10-CM | POA: Diagnosis not present

## 2019-01-17 DIAGNOSIS — Z03818 Encounter for observation for suspected exposure to other biological agents ruled out: Secondary | ICD-10-CM | POA: Diagnosis not present

## 2019-01-18 DIAGNOSIS — G4711 Idiopathic hypersomnia with long sleep time: Secondary | ICD-10-CM | POA: Diagnosis not present

## 2019-01-18 DIAGNOSIS — R413 Other amnesia: Secondary | ICD-10-CM | POA: Diagnosis not present

## 2019-01-18 DIAGNOSIS — G20 Parkinson's disease: Secondary | ICD-10-CM | POA: Diagnosis not present

## 2019-01-18 DIAGNOSIS — G4752 REM sleep behavior disorder: Secondary | ICD-10-CM | POA: Diagnosis not present

## 2019-01-18 DIAGNOSIS — R1310 Dysphagia, unspecified: Secondary | ICD-10-CM | POA: Diagnosis not present

## 2019-01-27 DIAGNOSIS — Z03818 Encounter for observation for suspected exposure to other biological agents ruled out: Secondary | ICD-10-CM | POA: Diagnosis not present

## 2019-01-31 DIAGNOSIS — Z03818 Encounter for observation for suspected exposure to other biological agents ruled out: Secondary | ICD-10-CM | POA: Diagnosis not present

## 2019-02-01 DIAGNOSIS — K219 Gastro-esophageal reflux disease without esophagitis: Secondary | ICD-10-CM | POA: Diagnosis not present

## 2019-02-01 DIAGNOSIS — G3183 Dementia with Lewy bodies: Secondary | ICD-10-CM | POA: Diagnosis not present

## 2019-02-01 DIAGNOSIS — F39 Unspecified mood [affective] disorder: Secondary | ICD-10-CM | POA: Diagnosis not present

## 2019-02-01 DIAGNOSIS — G20 Parkinson's disease: Secondary | ICD-10-CM | POA: Diagnosis not present

## 2019-02-13 DIAGNOSIS — L820 Inflamed seborrheic keratosis: Secondary | ICD-10-CM | POA: Diagnosis not present

## 2019-02-13 DIAGNOSIS — D1801 Hemangioma of skin and subcutaneous tissue: Secondary | ICD-10-CM | POA: Diagnosis not present

## 2019-02-13 DIAGNOSIS — L821 Other seborrheic keratosis: Secondary | ICD-10-CM | POA: Diagnosis not present

## 2019-02-13 DIAGNOSIS — L219 Seborrheic dermatitis, unspecified: Secondary | ICD-10-CM | POA: Diagnosis not present

## 2019-02-13 DIAGNOSIS — L578 Other skin changes due to chronic exposure to nonionizing radiation: Secondary | ICD-10-CM | POA: Diagnosis not present

## 2019-02-22 DIAGNOSIS — G20 Parkinson's disease: Secondary | ICD-10-CM | POA: Diagnosis not present

## 2019-02-22 DIAGNOSIS — F4320 Adjustment disorder, unspecified: Secondary | ICD-10-CM | POA: Diagnosis not present

## 2019-03-02 DIAGNOSIS — G20 Parkinson's disease: Secondary | ICD-10-CM | POA: Diagnosis not present

## 2019-03-09 DIAGNOSIS — G20 Parkinson's disease: Secondary | ICD-10-CM | POA: Diagnosis not present

## 2019-03-13 DIAGNOSIS — F39 Unspecified mood [affective] disorder: Secondary | ICD-10-CM | POA: Diagnosis not present

## 2019-03-13 DIAGNOSIS — K219 Gastro-esophageal reflux disease without esophagitis: Secondary | ICD-10-CM | POA: Diagnosis not present

## 2019-03-13 DIAGNOSIS — G3183 Dementia with Lewy bodies: Secondary | ICD-10-CM | POA: Diagnosis not present

## 2019-03-13 DIAGNOSIS — G20 Parkinson's disease: Secondary | ICD-10-CM | POA: Diagnosis not present

## 2019-03-14 DIAGNOSIS — G20 Parkinson's disease: Secondary | ICD-10-CM | POA: Diagnosis not present

## 2019-03-16 DIAGNOSIS — G20 Parkinson's disease: Secondary | ICD-10-CM | POA: Diagnosis not present

## 2019-03-20 DIAGNOSIS — G20 Parkinson's disease: Secondary | ICD-10-CM | POA: Diagnosis not present

## 2019-03-21 DIAGNOSIS — G20 Parkinson's disease: Secondary | ICD-10-CM | POA: Diagnosis not present

## 2019-03-23 DIAGNOSIS — G20 Parkinson's disease: Secondary | ICD-10-CM | POA: Diagnosis not present

## 2019-03-27 DIAGNOSIS — G20 Parkinson's disease: Secondary | ICD-10-CM | POA: Diagnosis not present

## 2019-03-28 DIAGNOSIS — G20 Parkinson's disease: Secondary | ICD-10-CM | POA: Diagnosis not present

## 2019-03-29 IMAGING — CT CT ANGIO CHEST
2 of 6 series · 19 of 36 positions shown · IV contrast (APPLIED)
Comparison: Chest radiograph performed earlier today at [DATE] a.m.

CLINICAL DATA: Acute onset of mid chest pain.

EXAM:
CT ANGIOGRAPHY CHEST WITH CONTRAST
TECHNIQUE: Multidetector CT imaging of the chest was performed using the
standard protocol during bolus administration of intravenous
contrast. Multiplanar CT image reconstructions and MIPs were
obtained to evaluate the vascular anatomy.
CONTRAST:  75mL OME4J2-3O8 IOPAMIDOL (OME4J2-3O8) INJECTION 76%

[Series 5: thins · axial · 0.63mm/px · z∈[-717,-464]mm · 18 of 283 slices shown]
[im 15/283  lung]
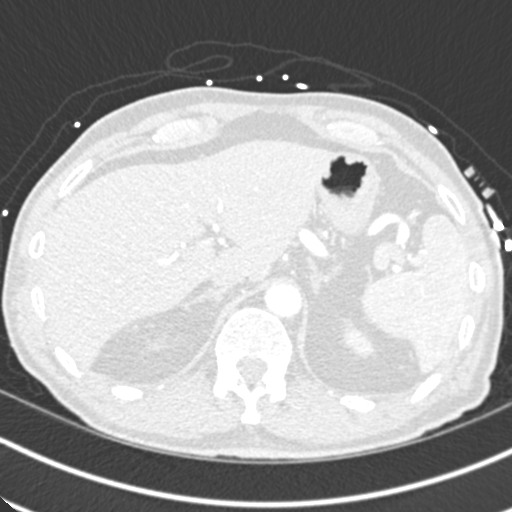
[im 29/283  mediastinal]
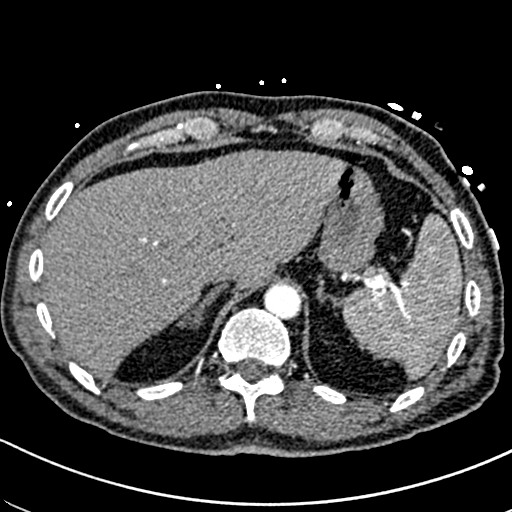
[im 43/283  lung]
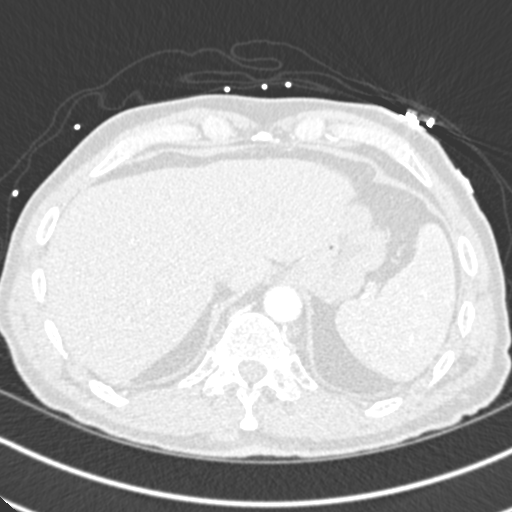
[im 57/283  mediastinal]
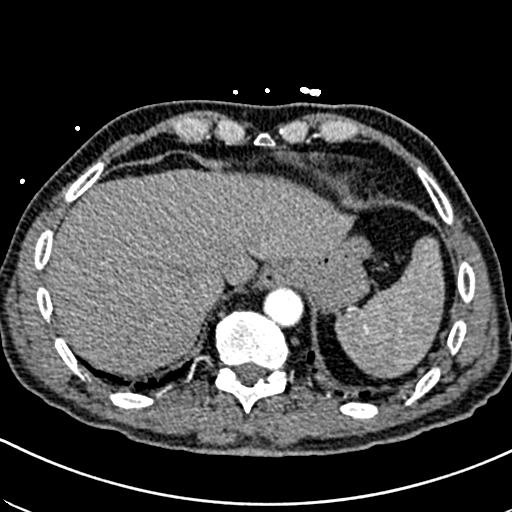
[im 71/283  lung]
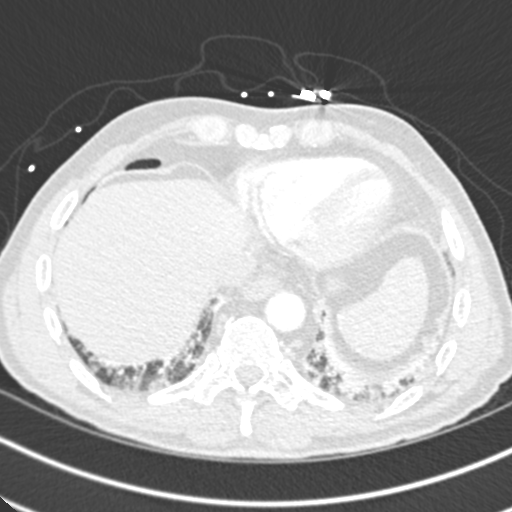
[im 85/283  mediastinal]
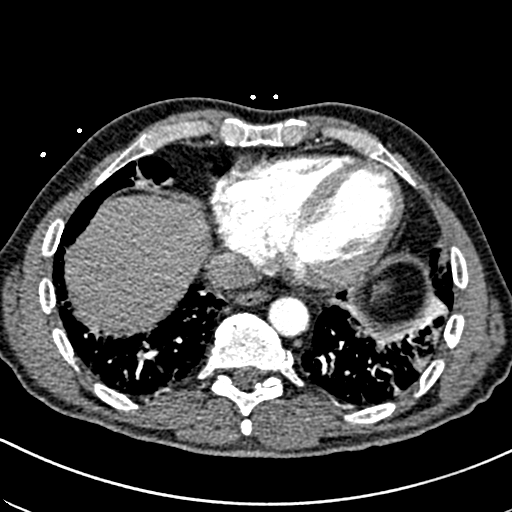
[im 99/283  lung]
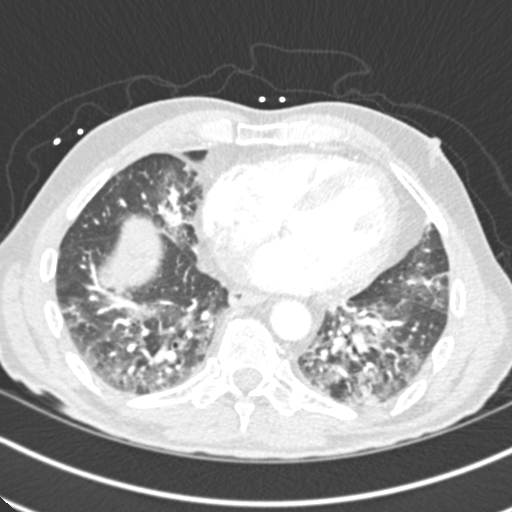
[im 113/283  mediastinal]
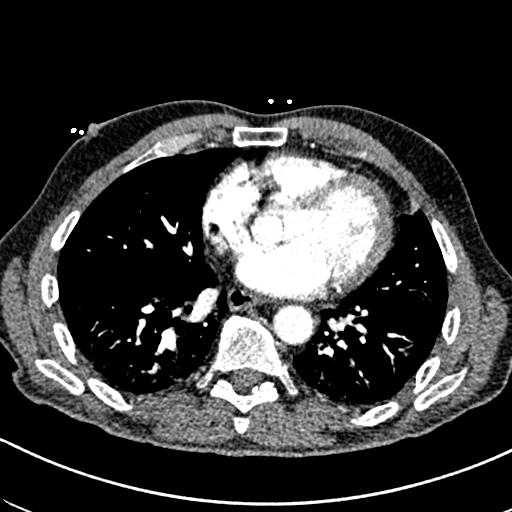
[im 127/283  lung]
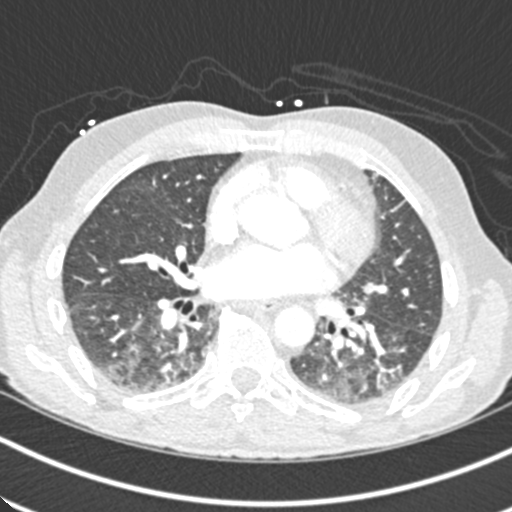
[im 156/283  mediastinal]
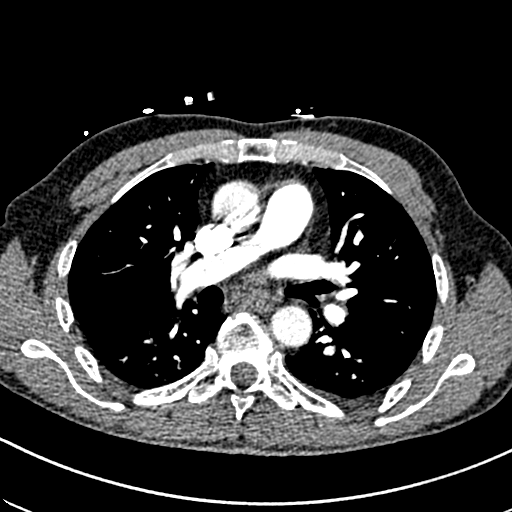
[im 170/283  lung]
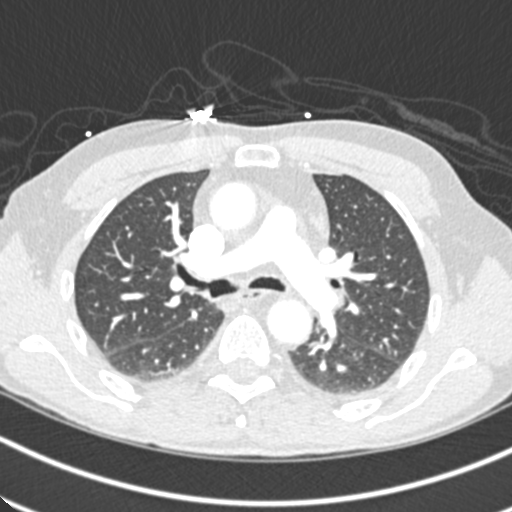
[im 184/283  mediastinal]
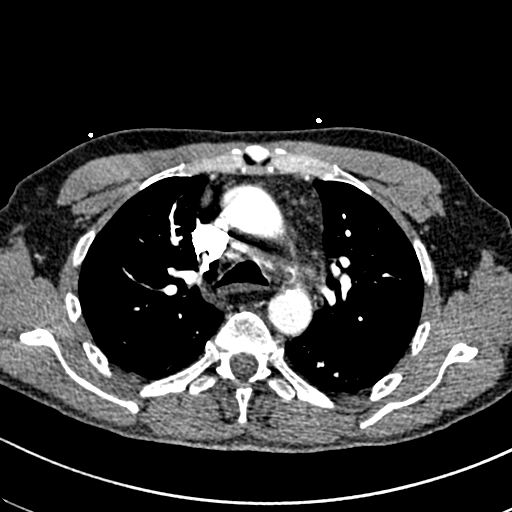
[im 198/283  lung]
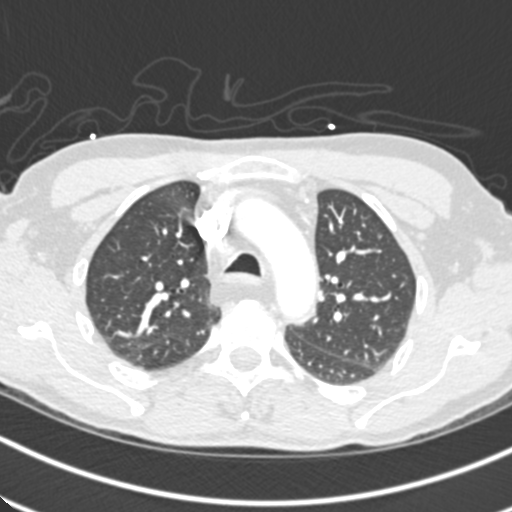
[im 212/283  mediastinal]
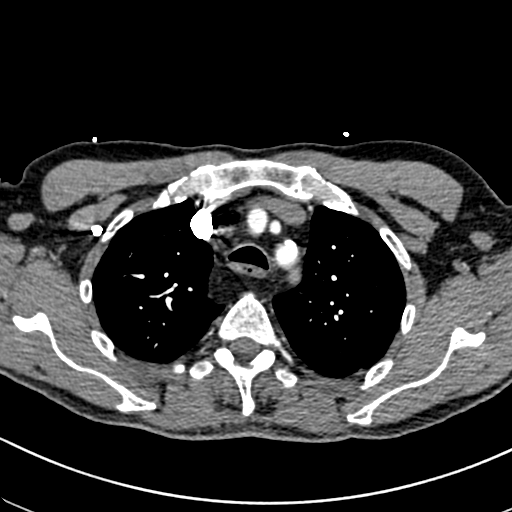
[im 226/283  lung]
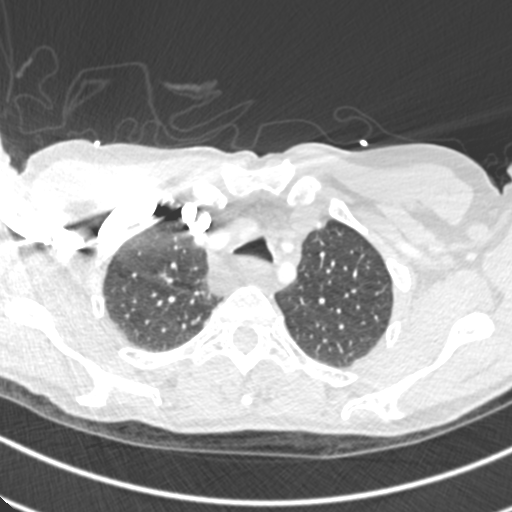
[im 240/283  mediastinal]
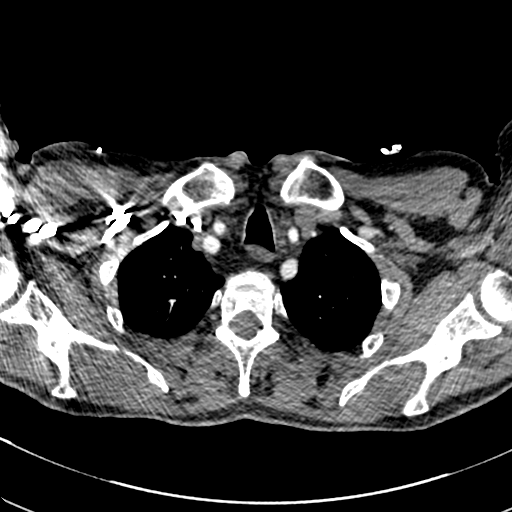
[im 254/283  lung]
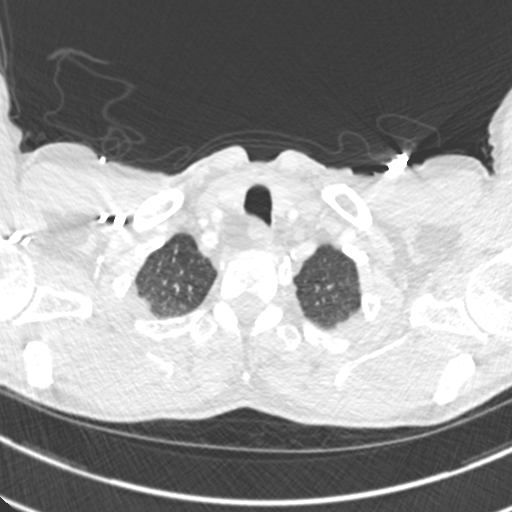
[im 268/283  mediastinal]
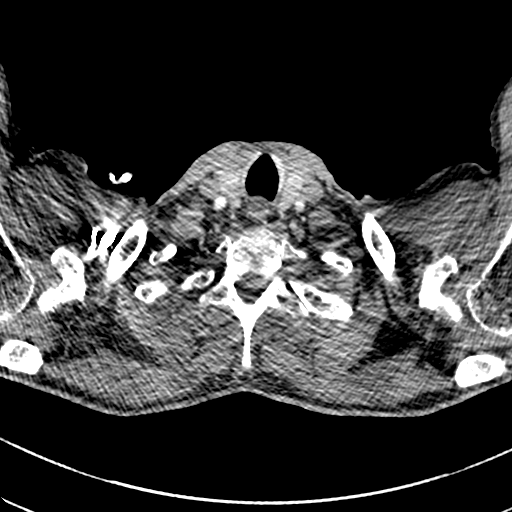

[Series 7: coronal mpr · coronal · 0.55mm/px · 1 of 74 slices shown]
[im 37/74  mediastinal]
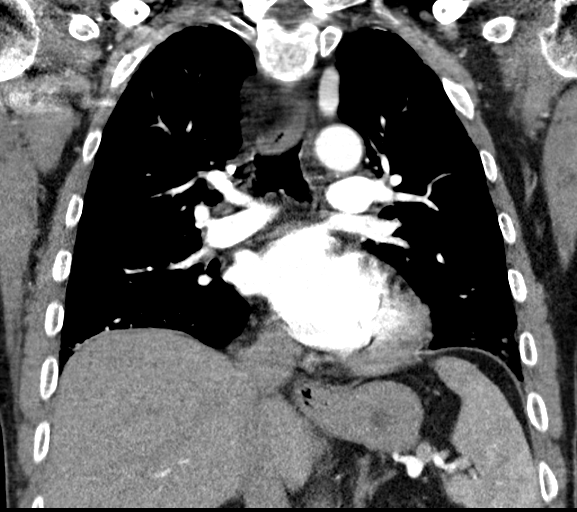

[19 of 36 positions shown; findings below may reference images not displayed]

FINDINGS: Cardiovascular:  There is no evidence of pulmonary embolus.

The heart is normal in size. The thoracic aorta is grossly
unremarkable. The great vessels are within normal limits.

Mediastinum/Nodes: The mediastinum is unremarkable in appearance. No
mediastinal lymphadenopathy is seen. No pericardial effusion is
identified. The visualized portions of the thyroid gland are
unremarkable. No axillary lymphadenopathy is seen.

Lungs/Pleura: Patchy bibasilar airspace opacities may reflect
atelectasis or mild pneumonia. No pleural effusion or pneumothorax
is seen. No masses are identified.

Upper Abdomen: The visualized portions of the liver and spleen are
unremarkable.

Musculoskeletal: No acute osseous abnormalities are identified. An
apparent bone island is noted at T3. The visualized musculature is
unremarkable in appearance.

Review of the MIP images confirms the above findings.
IMPRESSION: 1. No evidence of pulmonary embolus.
2. Patchy bibasilar airspace opacities may reflect atelectasis or
mild pneumonia.

## 2019-03-30 DIAGNOSIS — G20 Parkinson's disease: Secondary | ICD-10-CM | POA: Diagnosis not present

## 2019-04-03 DIAGNOSIS — G20 Parkinson's disease: Secondary | ICD-10-CM | POA: Diagnosis not present

## 2019-04-05 DIAGNOSIS — G20 Parkinson's disease: Secondary | ICD-10-CM | POA: Diagnosis not present

## 2019-04-06 DIAGNOSIS — G4711 Idiopathic hypersomnia with long sleep time: Secondary | ICD-10-CM | POA: Diagnosis not present

## 2019-04-06 DIAGNOSIS — G20 Parkinson's disease: Secondary | ICD-10-CM | POA: Diagnosis not present

## 2019-04-06 DIAGNOSIS — R413 Other amnesia: Secondary | ICD-10-CM | POA: Diagnosis not present

## 2019-04-06 DIAGNOSIS — G4752 REM sleep behavior disorder: Secondary | ICD-10-CM | POA: Diagnosis not present

## 2019-04-06 DIAGNOSIS — R42 Dizziness and giddiness: Secondary | ICD-10-CM | POA: Diagnosis not present

## 2019-04-07 DIAGNOSIS — G20 Parkinson's disease: Secondary | ICD-10-CM | POA: Diagnosis not present

## 2019-04-10 DIAGNOSIS — Z23 Encounter for immunization: Secondary | ICD-10-CM | POA: Diagnosis not present

## 2019-04-10 DIAGNOSIS — G20 Parkinson's disease: Secondary | ICD-10-CM | POA: Diagnosis not present

## 2019-04-11 DIAGNOSIS — G20 Parkinson's disease: Secondary | ICD-10-CM | POA: Diagnosis not present

## 2019-04-14 DIAGNOSIS — G20 Parkinson's disease: Secondary | ICD-10-CM | POA: Diagnosis not present

## 2019-04-17 DIAGNOSIS — G20 Parkinson's disease: Secondary | ICD-10-CM | POA: Diagnosis not present

## 2019-04-18 DIAGNOSIS — G20 Parkinson's disease: Secondary | ICD-10-CM | POA: Diagnosis not present

## 2019-04-20 DIAGNOSIS — G20 Parkinson's disease: Secondary | ICD-10-CM | POA: Diagnosis not present

## 2019-04-25 DIAGNOSIS — G20 Parkinson's disease: Secondary | ICD-10-CM | POA: Diagnosis not present

## 2019-04-26 DIAGNOSIS — G20 Parkinson's disease: Secondary | ICD-10-CM | POA: Diagnosis not present

## 2019-04-27 DIAGNOSIS — F29 Unspecified psychosis not due to a substance or known physiological condition: Secondary | ICD-10-CM | POA: Diagnosis not present

## 2019-04-28 DIAGNOSIS — G20 Parkinson's disease: Secondary | ICD-10-CM | POA: Diagnosis not present

## 2019-05-01 DIAGNOSIS — G20 Parkinson's disease: Secondary | ICD-10-CM | POA: Diagnosis not present

## 2019-05-03 DIAGNOSIS — G20 Parkinson's disease: Secondary | ICD-10-CM | POA: Diagnosis not present

## 2019-05-04 DIAGNOSIS — G20 Parkinson's disease: Secondary | ICD-10-CM | POA: Diagnosis not present

## 2019-05-08 DIAGNOSIS — Z23 Encounter for immunization: Secondary | ICD-10-CM | POA: Diagnosis not present

## 2019-05-09 DIAGNOSIS — G20 Parkinson's disease: Secondary | ICD-10-CM | POA: Diagnosis not present

## 2019-05-10 DIAGNOSIS — G20 Parkinson's disease: Secondary | ICD-10-CM | POA: Diagnosis not present

## 2019-05-12 DIAGNOSIS — K219 Gastro-esophageal reflux disease without esophagitis: Secondary | ICD-10-CM | POA: Diagnosis not present

## 2019-05-12 DIAGNOSIS — F0150 Vascular dementia without behavioral disturbance: Secondary | ICD-10-CM | POA: Diagnosis not present

## 2019-05-12 DIAGNOSIS — F39 Unspecified mood [affective] disorder: Secondary | ICD-10-CM | POA: Diagnosis not present

## 2019-05-12 DIAGNOSIS — G20 Parkinson's disease: Secondary | ICD-10-CM | POA: Diagnosis not present

## 2019-05-16 DIAGNOSIS — G20 Parkinson's disease: Secondary | ICD-10-CM | POA: Diagnosis not present

## 2019-05-17 DIAGNOSIS — G20 Parkinson's disease: Secondary | ICD-10-CM | POA: Diagnosis not present

## 2019-06-05 DIAGNOSIS — L578 Other skin changes due to chronic exposure to nonionizing radiation: Secondary | ICD-10-CM | POA: Diagnosis not present

## 2019-06-05 DIAGNOSIS — K130 Diseases of lips: Secondary | ICD-10-CM | POA: Diagnosis not present

## 2019-06-05 DIAGNOSIS — L409 Psoriasis, unspecified: Secondary | ICD-10-CM | POA: Diagnosis not present

## 2019-06-05 DIAGNOSIS — C4442 Squamous cell carcinoma of skin of scalp and neck: Secondary | ICD-10-CM | POA: Diagnosis not present

## 2019-06-05 DIAGNOSIS — L570 Actinic keratosis: Secondary | ICD-10-CM | POA: Diagnosis not present

## 2019-07-11 DIAGNOSIS — R413 Other amnesia: Secondary | ICD-10-CM | POA: Diagnosis not present

## 2019-07-11 DIAGNOSIS — G20 Parkinson's disease: Secondary | ICD-10-CM | POA: Diagnosis not present

## 2019-07-11 DIAGNOSIS — G4752 REM sleep behavior disorder: Secondary | ICD-10-CM | POA: Diagnosis not present

## 2019-07-11 DIAGNOSIS — G4711 Idiopathic hypersomnia with long sleep time: Secondary | ICD-10-CM | POA: Diagnosis not present

## 2019-07-18 DIAGNOSIS — G20 Parkinson's disease: Secondary | ICD-10-CM | POA: Diagnosis not present

## 2019-07-18 DIAGNOSIS — K219 Gastro-esophageal reflux disease without esophagitis: Secondary | ICD-10-CM | POA: Diagnosis not present

## 2019-07-18 DIAGNOSIS — G3183 Dementia with Lewy bodies: Secondary | ICD-10-CM | POA: Diagnosis not present

## 2019-07-18 DIAGNOSIS — F39 Unspecified mood [affective] disorder: Secondary | ICD-10-CM | POA: Diagnosis not present

## 2019-07-20 DIAGNOSIS — M25551 Pain in right hip: Secondary | ICD-10-CM | POA: Diagnosis not present

## 2019-07-26 DIAGNOSIS — B351 Tinea unguium: Secondary | ICD-10-CM | POA: Diagnosis not present

## 2019-08-03 DIAGNOSIS — H532 Diplopia: Secondary | ICD-10-CM | POA: Diagnosis not present

## 2019-08-07 DIAGNOSIS — H2513 Age-related nuclear cataract, bilateral: Secondary | ICD-10-CM | POA: Diagnosis not present

## 2019-08-30 DIAGNOSIS — G4711 Idiopathic hypersomnia with long sleep time: Secondary | ICD-10-CM | POA: Diagnosis not present

## 2019-08-30 DIAGNOSIS — G4752 REM sleep behavior disorder: Secondary | ICD-10-CM | POA: Diagnosis not present

## 2019-08-30 DIAGNOSIS — G20 Parkinson's disease: Secondary | ICD-10-CM | POA: Diagnosis not present

## 2019-08-30 DIAGNOSIS — R42 Dizziness and giddiness: Secondary | ICD-10-CM | POA: Diagnosis not present

## 2019-08-30 DIAGNOSIS — R413 Other amnesia: Secondary | ICD-10-CM | POA: Diagnosis not present

## 2019-08-30 DIAGNOSIS — G249 Dystonia, unspecified: Secondary | ICD-10-CM | POA: Insufficient documentation

## 2019-09-06 ENCOUNTER — Ambulatory Visit: Payer: Medicare Other | Admitting: Dermatology

## 2019-09-13 DIAGNOSIS — K219 Gastro-esophageal reflux disease without esophagitis: Secondary | ICD-10-CM | POA: Diagnosis not present

## 2019-09-13 DIAGNOSIS — G20 Parkinson's disease: Secondary | ICD-10-CM | POA: Diagnosis not present

## 2019-09-13 DIAGNOSIS — F39 Unspecified mood [affective] disorder: Secondary | ICD-10-CM | POA: Diagnosis not present

## 2019-10-11 DIAGNOSIS — G4752 REM sleep behavior disorder: Secondary | ICD-10-CM | POA: Diagnosis not present

## 2019-10-11 DIAGNOSIS — G20 Parkinson's disease: Secondary | ICD-10-CM | POA: Diagnosis not present

## 2019-10-11 DIAGNOSIS — G4711 Idiopathic hypersomnia with long sleep time: Secondary | ICD-10-CM | POA: Diagnosis not present

## 2019-10-11 DIAGNOSIS — R413 Other amnesia: Secondary | ICD-10-CM | POA: Diagnosis not present

## 2019-11-09 DIAGNOSIS — F0280 Dementia in other diseases classified elsewhere without behavioral disturbance: Secondary | ICD-10-CM | POA: Diagnosis not present

## 2019-11-09 DIAGNOSIS — F39 Unspecified mood [affective] disorder: Secondary | ICD-10-CM | POA: Diagnosis not present

## 2019-11-09 DIAGNOSIS — G20 Parkinson's disease: Secondary | ICD-10-CM | POA: Diagnosis not present

## 2019-11-09 DIAGNOSIS — K219 Gastro-esophageal reflux disease without esophagitis: Secondary | ICD-10-CM | POA: Diagnosis not present

## 2019-11-28 DIAGNOSIS — R109 Unspecified abdominal pain: Secondary | ICD-10-CM | POA: Diagnosis not present

## 2019-11-29 DIAGNOSIS — R109 Unspecified abdominal pain: Secondary | ICD-10-CM | POA: Diagnosis not present

## 2020-01-11 DIAGNOSIS — G20 Parkinson's disease: Secondary | ICD-10-CM | POA: Diagnosis not present

## 2020-01-11 DIAGNOSIS — R42 Dizziness and giddiness: Secondary | ICD-10-CM | POA: Diagnosis not present

## 2020-01-11 DIAGNOSIS — G4752 REM sleep behavior disorder: Secondary | ICD-10-CM | POA: Diagnosis not present

## 2020-01-11 DIAGNOSIS — R413 Other amnesia: Secondary | ICD-10-CM | POA: Diagnosis not present

## 2020-01-11 DIAGNOSIS — G4711 Idiopathic hypersomnia with long sleep time: Secondary | ICD-10-CM | POA: Diagnosis not present

## 2020-01-12 DIAGNOSIS — G3183 Dementia with Lewy bodies: Secondary | ICD-10-CM | POA: Diagnosis not present

## 2020-01-12 DIAGNOSIS — F39 Unspecified mood [affective] disorder: Secondary | ICD-10-CM | POA: Diagnosis not present

## 2020-01-12 DIAGNOSIS — G20 Parkinson's disease: Secondary | ICD-10-CM | POA: Diagnosis not present

## 2020-01-12 DIAGNOSIS — B351 Tinea unguium: Secondary | ICD-10-CM | POA: Diagnosis not present

## 2020-01-12 DIAGNOSIS — K219 Gastro-esophageal reflux disease without esophagitis: Secondary | ICD-10-CM | POA: Diagnosis not present

## 2020-02-01 DIAGNOSIS — G20 Parkinson's disease: Secondary | ICD-10-CM | POA: Diagnosis not present

## 2020-02-09 DIAGNOSIS — Z23 Encounter for immunization: Secondary | ICD-10-CM | POA: Diagnosis not present

## 2020-02-26 DIAGNOSIS — F39 Unspecified mood [affective] disorder: Secondary | ICD-10-CM

## 2020-03-07 DIAGNOSIS — M5432 Sciatica, left side: Secondary | ICD-10-CM

## 2020-03-14 DIAGNOSIS — F39 Unspecified mood [affective] disorder: Secondary | ICD-10-CM | POA: Diagnosis not present

## 2020-03-14 DIAGNOSIS — K219 Gastro-esophageal reflux disease without esophagitis: Secondary | ICD-10-CM | POA: Diagnosis not present

## 2020-03-14 DIAGNOSIS — M159 Polyosteoarthritis, unspecified: Secondary | ICD-10-CM | POA: Diagnosis not present

## 2020-03-14 DIAGNOSIS — G20 Parkinson's disease: Secondary | ICD-10-CM | POA: Diagnosis not present

## 2020-03-14 DIAGNOSIS — F0280 Dementia in other diseases classified elsewhere without behavioral disturbance: Secondary | ICD-10-CM | POA: Diagnosis not present

## 2020-04-16 DIAGNOSIS — R42 Dizziness and giddiness: Secondary | ICD-10-CM | POA: Diagnosis not present

## 2020-04-16 DIAGNOSIS — R413 Other amnesia: Secondary | ICD-10-CM | POA: Diagnosis not present

## 2020-04-16 DIAGNOSIS — G4711 Idiopathic hypersomnia with long sleep time: Secondary | ICD-10-CM | POA: Diagnosis not present

## 2020-04-16 DIAGNOSIS — G4752 REM sleep behavior disorder: Secondary | ICD-10-CM | POA: Diagnosis not present

## 2020-04-16 DIAGNOSIS — G20 Parkinson's disease: Secondary | ICD-10-CM | POA: Diagnosis not present

## 2020-04-30 DIAGNOSIS — F429 Obsessive-compulsive disorder, unspecified: Secondary | ICD-10-CM | POA: Diagnosis not present

## 2020-05-14 DIAGNOSIS — F39 Unspecified mood [affective] disorder: Secondary | ICD-10-CM | POA: Diagnosis not present

## 2020-05-17 DIAGNOSIS — F39 Unspecified mood [affective] disorder: Secondary | ICD-10-CM | POA: Diagnosis not present

## 2020-05-17 DIAGNOSIS — F0281 Dementia in other diseases classified elsewhere with behavioral disturbance: Secondary | ICD-10-CM | POA: Diagnosis not present

## 2020-05-17 DIAGNOSIS — K219 Gastro-esophageal reflux disease without esophagitis: Secondary | ICD-10-CM | POA: Diagnosis not present

## 2020-05-17 DIAGNOSIS — M1990 Unspecified osteoarthritis, unspecified site: Secondary | ICD-10-CM | POA: Diagnosis not present

## 2020-05-17 DIAGNOSIS — G20 Parkinson's disease: Secondary | ICD-10-CM | POA: Diagnosis not present

## 2020-06-06 DIAGNOSIS — R278 Other lack of coordination: Secondary | ICD-10-CM | POA: Diagnosis not present

## 2020-06-06 DIAGNOSIS — R2681 Unsteadiness on feet: Secondary | ICD-10-CM | POA: Diagnosis not present

## 2020-06-06 DIAGNOSIS — F0280 Dementia in other diseases classified elsewhere without behavioral disturbance: Secondary | ICD-10-CM | POA: Diagnosis not present

## 2020-06-06 DIAGNOSIS — R32 Unspecified urinary incontinence: Secondary | ICD-10-CM | POA: Diagnosis not present

## 2020-06-06 DIAGNOSIS — G20 Parkinson's disease: Secondary | ICD-10-CM | POA: Diagnosis not present

## 2020-06-06 DIAGNOSIS — F39 Unspecified mood [affective] disorder: Secondary | ICD-10-CM | POA: Diagnosis not present

## 2020-06-06 DIAGNOSIS — R296 Repeated falls: Secondary | ICD-10-CM | POA: Diagnosis not present

## 2020-06-06 DIAGNOSIS — W19XXXD Unspecified fall, subsequent encounter: Secondary | ICD-10-CM | POA: Diagnosis not present

## 2020-06-06 DIAGNOSIS — R4189 Other symptoms and signs involving cognitive functions and awareness: Secondary | ICD-10-CM | POA: Diagnosis not present

## 2020-06-08 DIAGNOSIS — R296 Repeated falls: Secondary | ICD-10-CM | POA: Diagnosis not present

## 2020-06-08 DIAGNOSIS — R4189 Other symptoms and signs involving cognitive functions and awareness: Secondary | ICD-10-CM | POA: Diagnosis not present

## 2020-06-08 DIAGNOSIS — R32 Unspecified urinary incontinence: Secondary | ICD-10-CM | POA: Diagnosis not present

## 2020-06-08 DIAGNOSIS — G20 Parkinson's disease: Secondary | ICD-10-CM | POA: Diagnosis not present

## 2020-06-08 DIAGNOSIS — F0280 Dementia in other diseases classified elsewhere without behavioral disturbance: Secondary | ICD-10-CM | POA: Diagnosis not present

## 2020-06-08 DIAGNOSIS — R2681 Unsteadiness on feet: Secondary | ICD-10-CM | POA: Diagnosis not present

## 2020-06-11 DIAGNOSIS — R2681 Unsteadiness on feet: Secondary | ICD-10-CM | POA: Diagnosis not present

## 2020-06-11 DIAGNOSIS — F0280 Dementia in other diseases classified elsewhere without behavioral disturbance: Secondary | ICD-10-CM | POA: Diagnosis not present

## 2020-06-11 DIAGNOSIS — G20 Parkinson's disease: Secondary | ICD-10-CM | POA: Diagnosis not present

## 2020-06-11 DIAGNOSIS — R4189 Other symptoms and signs involving cognitive functions and awareness: Secondary | ICD-10-CM | POA: Diagnosis not present

## 2020-06-11 DIAGNOSIS — R32 Unspecified urinary incontinence: Secondary | ICD-10-CM | POA: Diagnosis not present

## 2020-06-11 DIAGNOSIS — R296 Repeated falls: Secondary | ICD-10-CM | POA: Diagnosis not present

## 2020-06-15 DIAGNOSIS — G20 Parkinson's disease: Secondary | ICD-10-CM | POA: Diagnosis not present

## 2020-06-15 DIAGNOSIS — R296 Repeated falls: Secondary | ICD-10-CM | POA: Diagnosis not present

## 2020-06-15 DIAGNOSIS — R4189 Other symptoms and signs involving cognitive functions and awareness: Secondary | ICD-10-CM | POA: Diagnosis not present

## 2020-06-15 DIAGNOSIS — F0280 Dementia in other diseases classified elsewhere without behavioral disturbance: Secondary | ICD-10-CM | POA: Diagnosis not present

## 2020-06-15 DIAGNOSIS — R32 Unspecified urinary incontinence: Secondary | ICD-10-CM | POA: Diagnosis not present

## 2020-06-15 DIAGNOSIS — R2681 Unsteadiness on feet: Secondary | ICD-10-CM | POA: Diagnosis not present

## 2020-06-17 DIAGNOSIS — R32 Unspecified urinary incontinence: Secondary | ICD-10-CM | POA: Diagnosis not present

## 2020-06-17 DIAGNOSIS — R2681 Unsteadiness on feet: Secondary | ICD-10-CM | POA: Diagnosis not present

## 2020-06-17 DIAGNOSIS — R4189 Other symptoms and signs involving cognitive functions and awareness: Secondary | ICD-10-CM | POA: Diagnosis not present

## 2020-06-17 DIAGNOSIS — F0280 Dementia in other diseases classified elsewhere without behavioral disturbance: Secondary | ICD-10-CM | POA: Diagnosis not present

## 2020-06-17 DIAGNOSIS — R296 Repeated falls: Secondary | ICD-10-CM | POA: Diagnosis not present

## 2020-06-17 DIAGNOSIS — G20 Parkinson's disease: Secondary | ICD-10-CM | POA: Diagnosis not present

## 2020-06-19 DIAGNOSIS — G20 Parkinson's disease: Secondary | ICD-10-CM | POA: Diagnosis not present

## 2020-06-19 DIAGNOSIS — R4189 Other symptoms and signs involving cognitive functions and awareness: Secondary | ICD-10-CM | POA: Diagnosis not present

## 2020-06-19 DIAGNOSIS — R2681 Unsteadiness on feet: Secondary | ICD-10-CM | POA: Diagnosis not present

## 2020-06-19 DIAGNOSIS — R32 Unspecified urinary incontinence: Secondary | ICD-10-CM | POA: Diagnosis not present

## 2020-06-19 DIAGNOSIS — R296 Repeated falls: Secondary | ICD-10-CM | POA: Diagnosis not present

## 2020-06-19 DIAGNOSIS — F0280 Dementia in other diseases classified elsewhere without behavioral disturbance: Secondary | ICD-10-CM | POA: Diagnosis not present

## 2020-06-19 DIAGNOSIS — K5900 Constipation, unspecified: Secondary | ICD-10-CM | POA: Diagnosis not present

## 2020-06-20 DIAGNOSIS — R296 Repeated falls: Secondary | ICD-10-CM | POA: Diagnosis not present

## 2020-06-20 DIAGNOSIS — R32 Unspecified urinary incontinence: Secondary | ICD-10-CM | POA: Diagnosis not present

## 2020-06-20 DIAGNOSIS — R2681 Unsteadiness on feet: Secondary | ICD-10-CM | POA: Diagnosis not present

## 2020-06-20 DIAGNOSIS — G20 Parkinson's disease: Secondary | ICD-10-CM | POA: Diagnosis not present

## 2020-06-20 DIAGNOSIS — R4189 Other symptoms and signs involving cognitive functions and awareness: Secondary | ICD-10-CM | POA: Diagnosis not present

## 2020-06-20 DIAGNOSIS — F0280 Dementia in other diseases classified elsewhere without behavioral disturbance: Secondary | ICD-10-CM | POA: Diagnosis not present

## 2020-06-21 DIAGNOSIS — F0280 Dementia in other diseases classified elsewhere without behavioral disturbance: Secondary | ICD-10-CM | POA: Diagnosis not present

## 2020-06-21 DIAGNOSIS — R4189 Other symptoms and signs involving cognitive functions and awareness: Secondary | ICD-10-CM | POA: Diagnosis not present

## 2020-06-21 DIAGNOSIS — G20 Parkinson's disease: Secondary | ICD-10-CM | POA: Diagnosis not present

## 2020-06-21 DIAGNOSIS — R296 Repeated falls: Secondary | ICD-10-CM | POA: Diagnosis not present

## 2020-06-21 DIAGNOSIS — R2681 Unsteadiness on feet: Secondary | ICD-10-CM | POA: Diagnosis not present

## 2020-06-21 DIAGNOSIS — R32 Unspecified urinary incontinence: Secondary | ICD-10-CM | POA: Diagnosis not present

## 2020-06-22 DIAGNOSIS — F0280 Dementia in other diseases classified elsewhere without behavioral disturbance: Secondary | ICD-10-CM | POA: Diagnosis not present

## 2020-06-22 DIAGNOSIS — R4189 Other symptoms and signs involving cognitive functions and awareness: Secondary | ICD-10-CM | POA: Diagnosis not present

## 2020-06-22 DIAGNOSIS — R32 Unspecified urinary incontinence: Secondary | ICD-10-CM | POA: Diagnosis not present

## 2020-06-22 DIAGNOSIS — G20 Parkinson's disease: Secondary | ICD-10-CM | POA: Diagnosis not present

## 2020-06-22 DIAGNOSIS — R296 Repeated falls: Secondary | ICD-10-CM | POA: Diagnosis not present

## 2020-06-22 DIAGNOSIS — R2681 Unsteadiness on feet: Secondary | ICD-10-CM | POA: Diagnosis not present

## 2020-06-24 DIAGNOSIS — R4189 Other symptoms and signs involving cognitive functions and awareness: Secondary | ICD-10-CM | POA: Diagnosis not present

## 2020-06-24 DIAGNOSIS — G20 Parkinson's disease: Secondary | ICD-10-CM | POA: Diagnosis not present

## 2020-06-24 DIAGNOSIS — R32 Unspecified urinary incontinence: Secondary | ICD-10-CM | POA: Diagnosis not present

## 2020-06-24 DIAGNOSIS — F0280 Dementia in other diseases classified elsewhere without behavioral disturbance: Secondary | ICD-10-CM | POA: Diagnosis not present

## 2020-06-24 DIAGNOSIS — R2681 Unsteadiness on feet: Secondary | ICD-10-CM | POA: Diagnosis not present

## 2020-06-24 DIAGNOSIS — R296 Repeated falls: Secondary | ICD-10-CM | POA: Diagnosis not present

## 2020-06-25 DIAGNOSIS — G20 Parkinson's disease: Secondary | ICD-10-CM | POA: Diagnosis not present

## 2020-06-25 DIAGNOSIS — R2681 Unsteadiness on feet: Secondary | ICD-10-CM | POA: Diagnosis not present

## 2020-06-25 DIAGNOSIS — F0280 Dementia in other diseases classified elsewhere without behavioral disturbance: Secondary | ICD-10-CM | POA: Diagnosis not present

## 2020-06-25 DIAGNOSIS — R32 Unspecified urinary incontinence: Secondary | ICD-10-CM | POA: Diagnosis not present

## 2020-06-25 DIAGNOSIS — R296 Repeated falls: Secondary | ICD-10-CM | POA: Diagnosis not present

## 2020-06-25 DIAGNOSIS — R4189 Other symptoms and signs involving cognitive functions and awareness: Secondary | ICD-10-CM | POA: Diagnosis not present

## 2020-06-26 DIAGNOSIS — F0280 Dementia in other diseases classified elsewhere without behavioral disturbance: Secondary | ICD-10-CM | POA: Diagnosis not present

## 2020-06-26 DIAGNOSIS — Z96642 Presence of left artificial hip joint: Secondary | ICD-10-CM | POA: Diagnosis not present

## 2020-06-26 DIAGNOSIS — R32 Unspecified urinary incontinence: Secondary | ICD-10-CM | POA: Diagnosis not present

## 2020-06-26 DIAGNOSIS — R296 Repeated falls: Secondary | ICD-10-CM | POA: Diagnosis not present

## 2020-06-26 DIAGNOSIS — M25552 Pain in left hip: Secondary | ICD-10-CM | POA: Diagnosis not present

## 2020-06-26 DIAGNOSIS — R2681 Unsteadiness on feet: Secondary | ICD-10-CM | POA: Diagnosis not present

## 2020-06-26 DIAGNOSIS — R4189 Other symptoms and signs involving cognitive functions and awareness: Secondary | ICD-10-CM | POA: Diagnosis not present

## 2020-06-26 DIAGNOSIS — G20 Parkinson's disease: Secondary | ICD-10-CM | POA: Diagnosis not present

## 2020-06-28 DIAGNOSIS — G20 Parkinson's disease: Secondary | ICD-10-CM | POA: Diagnosis not present

## 2020-06-28 DIAGNOSIS — R296 Repeated falls: Secondary | ICD-10-CM | POA: Diagnosis not present

## 2020-06-28 DIAGNOSIS — F39 Unspecified mood [affective] disorder: Secondary | ICD-10-CM | POA: Diagnosis not present

## 2020-06-28 DIAGNOSIS — R4189 Other symptoms and signs involving cognitive functions and awareness: Secondary | ICD-10-CM | POA: Diagnosis not present

## 2020-06-28 DIAGNOSIS — R32 Unspecified urinary incontinence: Secondary | ICD-10-CM | POA: Diagnosis not present

## 2020-06-28 DIAGNOSIS — F0280 Dementia in other diseases classified elsewhere without behavioral disturbance: Secondary | ICD-10-CM | POA: Diagnosis not present

## 2020-06-28 DIAGNOSIS — R278 Other lack of coordination: Secondary | ICD-10-CM | POA: Diagnosis not present

## 2020-06-28 DIAGNOSIS — W19XXXD Unspecified fall, subsequent encounter: Secondary | ICD-10-CM | POA: Diagnosis not present

## 2020-06-28 DIAGNOSIS — R2681 Unsteadiness on feet: Secondary | ICD-10-CM | POA: Diagnosis not present

## 2020-06-29 DIAGNOSIS — G20 Parkinson's disease: Secondary | ICD-10-CM | POA: Diagnosis not present

## 2020-06-29 DIAGNOSIS — R296 Repeated falls: Secondary | ICD-10-CM | POA: Diagnosis not present

## 2020-06-29 DIAGNOSIS — F0280 Dementia in other diseases classified elsewhere without behavioral disturbance: Secondary | ICD-10-CM | POA: Diagnosis not present

## 2020-06-29 DIAGNOSIS — R32 Unspecified urinary incontinence: Secondary | ICD-10-CM | POA: Diagnosis not present

## 2020-06-29 DIAGNOSIS — R2681 Unsteadiness on feet: Secondary | ICD-10-CM | POA: Diagnosis not present

## 2020-06-29 DIAGNOSIS — R4189 Other symptoms and signs involving cognitive functions and awareness: Secondary | ICD-10-CM | POA: Diagnosis not present

## 2020-07-01 DIAGNOSIS — F0280 Dementia in other diseases classified elsewhere without behavioral disturbance: Secondary | ICD-10-CM | POA: Diagnosis not present

## 2020-07-01 DIAGNOSIS — G20 Parkinson's disease: Secondary | ICD-10-CM | POA: Diagnosis not present

## 2020-07-01 DIAGNOSIS — R32 Unspecified urinary incontinence: Secondary | ICD-10-CM | POA: Diagnosis not present

## 2020-07-01 DIAGNOSIS — R296 Repeated falls: Secondary | ICD-10-CM | POA: Diagnosis not present

## 2020-07-01 DIAGNOSIS — R2681 Unsteadiness on feet: Secondary | ICD-10-CM | POA: Diagnosis not present

## 2020-07-01 DIAGNOSIS — R4189 Other symptoms and signs involving cognitive functions and awareness: Secondary | ICD-10-CM | POA: Diagnosis not present

## 2020-07-03 DIAGNOSIS — F0280 Dementia in other diseases classified elsewhere without behavioral disturbance: Secondary | ICD-10-CM | POA: Diagnosis not present

## 2020-07-03 DIAGNOSIS — G20 Parkinson's disease: Secondary | ICD-10-CM | POA: Diagnosis not present

## 2020-07-03 DIAGNOSIS — R296 Repeated falls: Secondary | ICD-10-CM | POA: Diagnosis not present

## 2020-07-03 DIAGNOSIS — R2681 Unsteadiness on feet: Secondary | ICD-10-CM | POA: Diagnosis not present

## 2020-07-03 DIAGNOSIS — R4189 Other symptoms and signs involving cognitive functions and awareness: Secondary | ICD-10-CM | POA: Diagnosis not present

## 2020-07-03 DIAGNOSIS — R32 Unspecified urinary incontinence: Secondary | ICD-10-CM | POA: Diagnosis not present

## 2020-07-04 DIAGNOSIS — M5416 Radiculopathy, lumbar region: Secondary | ICD-10-CM | POA: Diagnosis not present

## 2020-07-04 DIAGNOSIS — M5136 Other intervertebral disc degeneration, lumbar region: Secondary | ICD-10-CM | POA: Diagnosis not present

## 2020-07-05 DIAGNOSIS — R32 Unspecified urinary incontinence: Secondary | ICD-10-CM | POA: Diagnosis not present

## 2020-07-05 DIAGNOSIS — R296 Repeated falls: Secondary | ICD-10-CM | POA: Diagnosis not present

## 2020-07-05 DIAGNOSIS — G20 Parkinson's disease: Secondary | ICD-10-CM | POA: Diagnosis not present

## 2020-07-05 DIAGNOSIS — F0280 Dementia in other diseases classified elsewhere without behavioral disturbance: Secondary | ICD-10-CM | POA: Diagnosis not present

## 2020-07-05 DIAGNOSIS — R4189 Other symptoms and signs involving cognitive functions and awareness: Secondary | ICD-10-CM | POA: Diagnosis not present

## 2020-07-05 DIAGNOSIS — R2681 Unsteadiness on feet: Secondary | ICD-10-CM | POA: Diagnosis not present

## 2020-07-07 DIAGNOSIS — F0280 Dementia in other diseases classified elsewhere without behavioral disturbance: Secondary | ICD-10-CM | POA: Diagnosis not present

## 2020-07-07 DIAGNOSIS — R2681 Unsteadiness on feet: Secondary | ICD-10-CM | POA: Diagnosis not present

## 2020-07-07 DIAGNOSIS — G20 Parkinson's disease: Secondary | ICD-10-CM | POA: Diagnosis not present

## 2020-07-07 DIAGNOSIS — R296 Repeated falls: Secondary | ICD-10-CM | POA: Diagnosis not present

## 2020-07-07 DIAGNOSIS — R4189 Other symptoms and signs involving cognitive functions and awareness: Secondary | ICD-10-CM | POA: Diagnosis not present

## 2020-07-07 DIAGNOSIS — R32 Unspecified urinary incontinence: Secondary | ICD-10-CM | POA: Diagnosis not present

## 2020-07-08 DIAGNOSIS — R296 Repeated falls: Secondary | ICD-10-CM | POA: Diagnosis not present

## 2020-07-08 DIAGNOSIS — R2681 Unsteadiness on feet: Secondary | ICD-10-CM | POA: Diagnosis not present

## 2020-07-08 DIAGNOSIS — R32 Unspecified urinary incontinence: Secondary | ICD-10-CM | POA: Diagnosis not present

## 2020-07-08 DIAGNOSIS — R4189 Other symptoms and signs involving cognitive functions and awareness: Secondary | ICD-10-CM | POA: Diagnosis not present

## 2020-07-08 DIAGNOSIS — G20 Parkinson's disease: Secondary | ICD-10-CM | POA: Diagnosis not present

## 2020-07-08 DIAGNOSIS — F0280 Dementia in other diseases classified elsewhere without behavioral disturbance: Secondary | ICD-10-CM | POA: Diagnosis not present

## 2020-07-09 DIAGNOSIS — R296 Repeated falls: Secondary | ICD-10-CM | POA: Diagnosis not present

## 2020-07-09 DIAGNOSIS — R2681 Unsteadiness on feet: Secondary | ICD-10-CM | POA: Diagnosis not present

## 2020-07-09 DIAGNOSIS — R4189 Other symptoms and signs involving cognitive functions and awareness: Secondary | ICD-10-CM | POA: Diagnosis not present

## 2020-07-09 DIAGNOSIS — F0280 Dementia in other diseases classified elsewhere without behavioral disturbance: Secondary | ICD-10-CM | POA: Diagnosis not present

## 2020-07-09 DIAGNOSIS — G20 Parkinson's disease: Secondary | ICD-10-CM | POA: Diagnosis not present

## 2020-07-09 DIAGNOSIS — R32 Unspecified urinary incontinence: Secondary | ICD-10-CM | POA: Diagnosis not present

## 2020-07-10 DIAGNOSIS — R4189 Other symptoms and signs involving cognitive functions and awareness: Secondary | ICD-10-CM | POA: Diagnosis not present

## 2020-07-10 DIAGNOSIS — F0280 Dementia in other diseases classified elsewhere without behavioral disturbance: Secondary | ICD-10-CM | POA: Diagnosis not present

## 2020-07-10 DIAGNOSIS — R32 Unspecified urinary incontinence: Secondary | ICD-10-CM | POA: Diagnosis not present

## 2020-07-10 DIAGNOSIS — R2681 Unsteadiness on feet: Secondary | ICD-10-CM | POA: Diagnosis not present

## 2020-07-10 DIAGNOSIS — G20 Parkinson's disease: Secondary | ICD-10-CM | POA: Diagnosis not present

## 2020-07-10 DIAGNOSIS — R296 Repeated falls: Secondary | ICD-10-CM | POA: Diagnosis not present

## 2020-07-12 DIAGNOSIS — R32 Unspecified urinary incontinence: Secondary | ICD-10-CM | POA: Diagnosis not present

## 2020-07-12 DIAGNOSIS — R4189 Other symptoms and signs involving cognitive functions and awareness: Secondary | ICD-10-CM | POA: Diagnosis not present

## 2020-07-12 DIAGNOSIS — F0280 Dementia in other diseases classified elsewhere without behavioral disturbance: Secondary | ICD-10-CM | POA: Diagnosis not present

## 2020-07-12 DIAGNOSIS — R2681 Unsteadiness on feet: Secondary | ICD-10-CM | POA: Diagnosis not present

## 2020-07-12 DIAGNOSIS — R296 Repeated falls: Secondary | ICD-10-CM | POA: Diagnosis not present

## 2020-07-12 DIAGNOSIS — G20 Parkinson's disease: Secondary | ICD-10-CM | POA: Diagnosis not present

## 2020-07-15 DIAGNOSIS — R296 Repeated falls: Secondary | ICD-10-CM | POA: Diagnosis not present

## 2020-07-15 DIAGNOSIS — R2681 Unsteadiness on feet: Secondary | ICD-10-CM | POA: Diagnosis not present

## 2020-07-15 DIAGNOSIS — G20 Parkinson's disease: Secondary | ICD-10-CM | POA: Diagnosis not present

## 2020-07-15 DIAGNOSIS — R32 Unspecified urinary incontinence: Secondary | ICD-10-CM | POA: Diagnosis not present

## 2020-07-15 DIAGNOSIS — F0280 Dementia in other diseases classified elsewhere without behavioral disturbance: Secondary | ICD-10-CM | POA: Diagnosis not present

## 2020-07-15 DIAGNOSIS — R4189 Other symptoms and signs involving cognitive functions and awareness: Secondary | ICD-10-CM | POA: Diagnosis not present

## 2020-07-16 DIAGNOSIS — R32 Unspecified urinary incontinence: Secondary | ICD-10-CM | POA: Diagnosis not present

## 2020-07-16 DIAGNOSIS — R4189 Other symptoms and signs involving cognitive functions and awareness: Secondary | ICD-10-CM | POA: Diagnosis not present

## 2020-07-16 DIAGNOSIS — F0280 Dementia in other diseases classified elsewhere without behavioral disturbance: Secondary | ICD-10-CM | POA: Diagnosis not present

## 2020-07-16 DIAGNOSIS — R2681 Unsteadiness on feet: Secondary | ICD-10-CM | POA: Diagnosis not present

## 2020-07-16 DIAGNOSIS — G20 Parkinson's disease: Secondary | ICD-10-CM | POA: Diagnosis not present

## 2020-07-16 DIAGNOSIS — R296 Repeated falls: Secondary | ICD-10-CM | POA: Diagnosis not present

## 2020-07-18 DIAGNOSIS — R2681 Unsteadiness on feet: Secondary | ICD-10-CM | POA: Diagnosis not present

## 2020-07-18 DIAGNOSIS — R32 Unspecified urinary incontinence: Secondary | ICD-10-CM | POA: Diagnosis not present

## 2020-07-18 DIAGNOSIS — F0280 Dementia in other diseases classified elsewhere without behavioral disturbance: Secondary | ICD-10-CM | POA: Diagnosis not present

## 2020-07-18 DIAGNOSIS — R296 Repeated falls: Secondary | ICD-10-CM | POA: Diagnosis not present

## 2020-07-18 DIAGNOSIS — G20 Parkinson's disease: Secondary | ICD-10-CM | POA: Diagnosis not present

## 2020-07-18 DIAGNOSIS — R4189 Other symptoms and signs involving cognitive functions and awareness: Secondary | ICD-10-CM | POA: Diagnosis not present

## 2020-07-20 DIAGNOSIS — G20 Parkinson's disease: Secondary | ICD-10-CM | POA: Diagnosis not present

## 2020-07-20 DIAGNOSIS — R2681 Unsteadiness on feet: Secondary | ICD-10-CM | POA: Diagnosis not present

## 2020-07-20 DIAGNOSIS — F0280 Dementia in other diseases classified elsewhere without behavioral disturbance: Secondary | ICD-10-CM | POA: Diagnosis not present

## 2020-07-20 DIAGNOSIS — R4189 Other symptoms and signs involving cognitive functions and awareness: Secondary | ICD-10-CM | POA: Diagnosis not present

## 2020-07-20 DIAGNOSIS — R32 Unspecified urinary incontinence: Secondary | ICD-10-CM | POA: Diagnosis not present

## 2020-07-20 DIAGNOSIS — R296 Repeated falls: Secondary | ICD-10-CM | POA: Diagnosis not present

## 2020-07-21 DIAGNOSIS — R4189 Other symptoms and signs involving cognitive functions and awareness: Secondary | ICD-10-CM | POA: Diagnosis not present

## 2020-07-21 DIAGNOSIS — R32 Unspecified urinary incontinence: Secondary | ICD-10-CM | POA: Diagnosis not present

## 2020-07-21 DIAGNOSIS — R296 Repeated falls: Secondary | ICD-10-CM | POA: Diagnosis not present

## 2020-07-21 DIAGNOSIS — F0280 Dementia in other diseases classified elsewhere without behavioral disturbance: Secondary | ICD-10-CM | POA: Diagnosis not present

## 2020-07-21 DIAGNOSIS — G20 Parkinson's disease: Secondary | ICD-10-CM | POA: Diagnosis not present

## 2020-07-21 DIAGNOSIS — R2681 Unsteadiness on feet: Secondary | ICD-10-CM | POA: Diagnosis not present

## 2020-07-22 DIAGNOSIS — R296 Repeated falls: Secondary | ICD-10-CM | POA: Diagnosis not present

## 2020-07-22 DIAGNOSIS — R32 Unspecified urinary incontinence: Secondary | ICD-10-CM | POA: Diagnosis not present

## 2020-07-22 DIAGNOSIS — R4189 Other symptoms and signs involving cognitive functions and awareness: Secondary | ICD-10-CM | POA: Diagnosis not present

## 2020-07-22 DIAGNOSIS — R2681 Unsteadiness on feet: Secondary | ICD-10-CM | POA: Diagnosis not present

## 2020-07-22 DIAGNOSIS — F0280 Dementia in other diseases classified elsewhere without behavioral disturbance: Secondary | ICD-10-CM | POA: Diagnosis not present

## 2020-07-22 DIAGNOSIS — G20 Parkinson's disease: Secondary | ICD-10-CM | POA: Diagnosis not present

## 2020-07-23 DIAGNOSIS — R2681 Unsteadiness on feet: Secondary | ICD-10-CM | POA: Diagnosis not present

## 2020-07-23 DIAGNOSIS — K219 Gastro-esophageal reflux disease without esophagitis: Secondary | ICD-10-CM | POA: Diagnosis not present

## 2020-07-23 DIAGNOSIS — R4189 Other symptoms and signs involving cognitive functions and awareness: Secondary | ICD-10-CM | POA: Diagnosis not present

## 2020-07-23 DIAGNOSIS — F0280 Dementia in other diseases classified elsewhere without behavioral disturbance: Secondary | ICD-10-CM | POA: Diagnosis not present

## 2020-07-23 DIAGNOSIS — R32 Unspecified urinary incontinence: Secondary | ICD-10-CM | POA: Diagnosis not present

## 2020-07-23 DIAGNOSIS — F39 Unspecified mood [affective] disorder: Secondary | ICD-10-CM | POA: Diagnosis not present

## 2020-07-23 DIAGNOSIS — G20 Parkinson's disease: Secondary | ICD-10-CM | POA: Diagnosis not present

## 2020-07-23 DIAGNOSIS — R296 Repeated falls: Secondary | ICD-10-CM | POA: Diagnosis not present

## 2020-07-23 DIAGNOSIS — G3183 Dementia with Lewy bodies: Secondary | ICD-10-CM | POA: Diagnosis not present

## 2020-07-23 DIAGNOSIS — M159 Polyosteoarthritis, unspecified: Secondary | ICD-10-CM | POA: Diagnosis not present

## 2020-07-25 DIAGNOSIS — R4189 Other symptoms and signs involving cognitive functions and awareness: Secondary | ICD-10-CM | POA: Diagnosis not present

## 2020-07-25 DIAGNOSIS — F0280 Dementia in other diseases classified elsewhere without behavioral disturbance: Secondary | ICD-10-CM | POA: Diagnosis not present

## 2020-07-25 DIAGNOSIS — R2681 Unsteadiness on feet: Secondary | ICD-10-CM | POA: Diagnosis not present

## 2020-07-25 DIAGNOSIS — R296 Repeated falls: Secondary | ICD-10-CM | POA: Diagnosis not present

## 2020-07-25 DIAGNOSIS — G20 Parkinson's disease: Secondary | ICD-10-CM | POA: Diagnosis not present

## 2020-07-25 DIAGNOSIS — R32 Unspecified urinary incontinence: Secondary | ICD-10-CM | POA: Diagnosis not present

## 2020-07-26 DIAGNOSIS — R32 Unspecified urinary incontinence: Secondary | ICD-10-CM | POA: Diagnosis not present

## 2020-07-26 DIAGNOSIS — R4189 Other symptoms and signs involving cognitive functions and awareness: Secondary | ICD-10-CM | POA: Diagnosis not present

## 2020-07-26 DIAGNOSIS — R296 Repeated falls: Secondary | ICD-10-CM | POA: Diagnosis not present

## 2020-07-26 DIAGNOSIS — F333 Major depressive disorder, recurrent, severe with psychotic symptoms: Secondary | ICD-10-CM | POA: Diagnosis not present

## 2020-07-26 DIAGNOSIS — Z79899 Other long term (current) drug therapy: Secondary | ICD-10-CM | POA: Diagnosis not present

## 2020-07-26 DIAGNOSIS — F411 Generalized anxiety disorder: Secondary | ICD-10-CM | POA: Diagnosis not present

## 2020-07-26 DIAGNOSIS — F0280 Dementia in other diseases classified elsewhere without behavioral disturbance: Secondary | ICD-10-CM | POA: Diagnosis not present

## 2020-07-26 DIAGNOSIS — G20 Parkinson's disease: Secondary | ICD-10-CM | POA: Diagnosis not present

## 2020-07-26 DIAGNOSIS — Z1389 Encounter for screening for other disorder: Secondary | ICD-10-CM | POA: Diagnosis not present

## 2020-07-26 DIAGNOSIS — R2681 Unsteadiness on feet: Secondary | ICD-10-CM | POA: Diagnosis not present

## 2020-07-27 DIAGNOSIS — G20 Parkinson's disease: Secondary | ICD-10-CM | POA: Diagnosis not present

## 2020-07-27 DIAGNOSIS — R32 Unspecified urinary incontinence: Secondary | ICD-10-CM | POA: Diagnosis not present

## 2020-07-27 DIAGNOSIS — R296 Repeated falls: Secondary | ICD-10-CM | POA: Diagnosis not present

## 2020-07-27 DIAGNOSIS — F0280 Dementia in other diseases classified elsewhere without behavioral disturbance: Secondary | ICD-10-CM | POA: Diagnosis not present

## 2020-07-27 DIAGNOSIS — R2681 Unsteadiness on feet: Secondary | ICD-10-CM | POA: Diagnosis not present

## 2020-07-27 DIAGNOSIS — R4189 Other symptoms and signs involving cognitive functions and awareness: Secondary | ICD-10-CM | POA: Diagnosis not present

## 2020-07-29 DIAGNOSIS — R278 Other lack of coordination: Secondary | ICD-10-CM | POA: Diagnosis not present

## 2020-07-29 DIAGNOSIS — R4189 Other symptoms and signs involving cognitive functions and awareness: Secondary | ICD-10-CM | POA: Diagnosis not present

## 2020-07-29 DIAGNOSIS — F0280 Dementia in other diseases classified elsewhere without behavioral disturbance: Secondary | ICD-10-CM | POA: Diagnosis not present

## 2020-07-29 DIAGNOSIS — F39 Unspecified mood [affective] disorder: Secondary | ICD-10-CM | POA: Diagnosis not present

## 2020-07-29 DIAGNOSIS — R296 Repeated falls: Secondary | ICD-10-CM | POA: Diagnosis not present

## 2020-07-29 DIAGNOSIS — W19XXXD Unspecified fall, subsequent encounter: Secondary | ICD-10-CM | POA: Diagnosis not present

## 2020-07-29 DIAGNOSIS — G20 Parkinson's disease: Secondary | ICD-10-CM | POA: Diagnosis not present

## 2020-07-29 DIAGNOSIS — R2689 Other abnormalities of gait and mobility: Secondary | ICD-10-CM | POA: Diagnosis not present

## 2020-07-29 DIAGNOSIS — R32 Unspecified urinary incontinence: Secondary | ICD-10-CM | POA: Diagnosis not present

## 2020-07-29 DIAGNOSIS — R2681 Unsteadiness on feet: Secondary | ICD-10-CM | POA: Diagnosis not present

## 2020-07-30 DIAGNOSIS — G20 Parkinson's disease: Secondary | ICD-10-CM | POA: Diagnosis not present

## 2020-07-30 DIAGNOSIS — F0280 Dementia in other diseases classified elsewhere without behavioral disturbance: Secondary | ICD-10-CM | POA: Diagnosis not present

## 2020-07-30 DIAGNOSIS — R2681 Unsteadiness on feet: Secondary | ICD-10-CM | POA: Diagnosis not present

## 2020-07-30 DIAGNOSIS — R32 Unspecified urinary incontinence: Secondary | ICD-10-CM | POA: Diagnosis not present

## 2020-07-30 DIAGNOSIS — R296 Repeated falls: Secondary | ICD-10-CM | POA: Diagnosis not present

## 2020-07-30 DIAGNOSIS — R4189 Other symptoms and signs involving cognitive functions and awareness: Secondary | ICD-10-CM | POA: Diagnosis not present

## 2020-07-31 DIAGNOSIS — R32 Unspecified urinary incontinence: Secondary | ICD-10-CM | POA: Diagnosis not present

## 2020-07-31 DIAGNOSIS — G20 Parkinson's disease: Secondary | ICD-10-CM | POA: Diagnosis not present

## 2020-07-31 DIAGNOSIS — F0280 Dementia in other diseases classified elsewhere without behavioral disturbance: Secondary | ICD-10-CM | POA: Diagnosis not present

## 2020-07-31 DIAGNOSIS — R296 Repeated falls: Secondary | ICD-10-CM | POA: Diagnosis not present

## 2020-07-31 DIAGNOSIS — R4189 Other symptoms and signs involving cognitive functions and awareness: Secondary | ICD-10-CM | POA: Diagnosis not present

## 2020-07-31 DIAGNOSIS — R2681 Unsteadiness on feet: Secondary | ICD-10-CM | POA: Diagnosis not present

## 2020-08-01 DIAGNOSIS — R2681 Unsteadiness on feet: Secondary | ICD-10-CM | POA: Diagnosis not present

## 2020-08-01 DIAGNOSIS — R4189 Other symptoms and signs involving cognitive functions and awareness: Secondary | ICD-10-CM | POA: Diagnosis not present

## 2020-08-01 DIAGNOSIS — R32 Unspecified urinary incontinence: Secondary | ICD-10-CM | POA: Diagnosis not present

## 2020-08-01 DIAGNOSIS — R296 Repeated falls: Secondary | ICD-10-CM | POA: Diagnosis not present

## 2020-08-01 DIAGNOSIS — G20 Parkinson's disease: Secondary | ICD-10-CM | POA: Diagnosis not present

## 2020-08-01 DIAGNOSIS — F0280 Dementia in other diseases classified elsewhere without behavioral disturbance: Secondary | ICD-10-CM | POA: Diagnosis not present

## 2020-08-16 DIAGNOSIS — Z23 Encounter for immunization: Secondary | ICD-10-CM | POA: Diagnosis not present

## 2020-08-19 DIAGNOSIS — R1314 Dysphagia, pharyngoesophageal phase: Secondary | ICD-10-CM | POA: Diagnosis not present

## 2020-08-30 DIAGNOSIS — K219 Gastro-esophageal reflux disease without esophagitis: Secondary | ICD-10-CM | POA: Diagnosis not present

## 2020-08-30 DIAGNOSIS — F411 Generalized anxiety disorder: Secondary | ICD-10-CM | POA: Diagnosis not present

## 2020-08-30 DIAGNOSIS — M1990 Unspecified osteoarthritis, unspecified site: Secondary | ICD-10-CM | POA: Diagnosis not present

## 2020-08-30 DIAGNOSIS — G20 Parkinson's disease: Secondary | ICD-10-CM | POA: Diagnosis not present

## 2020-08-30 DIAGNOSIS — F333 Major depressive disorder, recurrent, severe with psychotic symptoms: Secondary | ICD-10-CM | POA: Diagnosis not present

## 2020-08-30 DIAGNOSIS — M25511 Pain in right shoulder: Secondary | ICD-10-CM | POA: Diagnosis not present

## 2020-08-30 DIAGNOSIS — F39 Unspecified mood [affective] disorder: Secondary | ICD-10-CM | POA: Diagnosis not present

## 2020-09-03 DIAGNOSIS — M62838 Other muscle spasm: Secondary | ICD-10-CM | POA: Diagnosis not present

## 2020-09-11 DIAGNOSIS — M25511 Pain in right shoulder: Secondary | ICD-10-CM | POA: Diagnosis not present

## 2020-09-13 DIAGNOSIS — M25511 Pain in right shoulder: Secondary | ICD-10-CM | POA: Diagnosis not present

## 2020-09-13 DIAGNOSIS — M5136 Other intervertebral disc degeneration, lumbar region: Secondary | ICD-10-CM | POA: Diagnosis not present

## 2020-09-18 DIAGNOSIS — M25511 Pain in right shoulder: Secondary | ICD-10-CM | POA: Diagnosis not present

## 2020-09-21 DIAGNOSIS — M25511 Pain in right shoulder: Secondary | ICD-10-CM | POA: Diagnosis not present

## 2020-09-23 DIAGNOSIS — M25511 Pain in right shoulder: Secondary | ICD-10-CM | POA: Diagnosis not present

## 2020-09-27 DIAGNOSIS — Z20822 Contact with and (suspected) exposure to covid-19: Secondary | ICD-10-CM | POA: Diagnosis not present

## 2020-09-28 DIAGNOSIS — M25511 Pain in right shoulder: Secondary | ICD-10-CM | POA: Diagnosis not present

## 2020-09-30 DIAGNOSIS — M25511 Pain in right shoulder: Secondary | ICD-10-CM | POA: Diagnosis not present

## 2020-10-01 DIAGNOSIS — M25511 Pain in right shoulder: Secondary | ICD-10-CM | POA: Diagnosis not present

## 2020-10-02 DIAGNOSIS — M25511 Pain in right shoulder: Secondary | ICD-10-CM | POA: Diagnosis not present

## 2020-10-04 DIAGNOSIS — M25511 Pain in right shoulder: Secondary | ICD-10-CM | POA: Diagnosis not present

## 2020-10-07 DIAGNOSIS — M25511 Pain in right shoulder: Secondary | ICD-10-CM | POA: Diagnosis not present

## 2020-10-09 DIAGNOSIS — M25511 Pain in right shoulder: Secondary | ICD-10-CM | POA: Diagnosis not present

## 2020-10-09 DIAGNOSIS — F0391 Unspecified dementia with behavioral disturbance: Secondary | ICD-10-CM | POA: Diagnosis not present

## 2020-10-09 DIAGNOSIS — F419 Anxiety disorder, unspecified: Secondary | ICD-10-CM | POA: Diagnosis not present

## 2020-10-11 DIAGNOSIS — M25511 Pain in right shoulder: Secondary | ICD-10-CM | POA: Diagnosis not present

## 2020-10-15 DIAGNOSIS — M25511 Pain in right shoulder: Secondary | ICD-10-CM | POA: Diagnosis not present

## 2020-10-16 DIAGNOSIS — M25511 Pain in right shoulder: Secondary | ICD-10-CM | POA: Diagnosis not present

## 2020-10-18 DIAGNOSIS — M25511 Pain in right shoulder: Secondary | ICD-10-CM | POA: Diagnosis not present

## 2020-10-21 DIAGNOSIS — M25511 Pain in right shoulder: Secondary | ICD-10-CM | POA: Diagnosis not present

## 2020-10-26 DIAGNOSIS — M25511 Pain in right shoulder: Secondary | ICD-10-CM | POA: Diagnosis not present

## 2020-11-03 DIAGNOSIS — M25511 Pain in right shoulder: Secondary | ICD-10-CM | POA: Diagnosis not present

## 2020-11-04 DIAGNOSIS — M25511 Pain in right shoulder: Secondary | ICD-10-CM | POA: Diagnosis not present

## 2020-11-06 DIAGNOSIS — G4711 Idiopathic hypersomnia with long sleep time: Secondary | ICD-10-CM | POA: Diagnosis not present

## 2020-11-06 DIAGNOSIS — G20 Parkinson's disease: Secondary | ICD-10-CM | POA: Diagnosis not present

## 2020-11-06 DIAGNOSIS — G4752 REM sleep behavior disorder: Secondary | ICD-10-CM | POA: Diagnosis not present

## 2020-11-06 DIAGNOSIS — R413 Other amnesia: Secondary | ICD-10-CM | POA: Diagnosis not present

## 2020-11-06 DIAGNOSIS — R42 Dizziness and giddiness: Secondary | ICD-10-CM | POA: Diagnosis not present

## 2020-11-06 DIAGNOSIS — M25511 Pain in right shoulder: Secondary | ICD-10-CM | POA: Diagnosis not present

## 2020-11-11 DIAGNOSIS — F39 Unspecified mood [affective] disorder: Secondary | ICD-10-CM | POA: Diagnosis not present

## 2020-11-11 DIAGNOSIS — K219 Gastro-esophageal reflux disease without esophagitis: Secondary | ICD-10-CM | POA: Diagnosis not present

## 2020-11-11 DIAGNOSIS — G3183 Dementia with Lewy bodies: Secondary | ICD-10-CM | POA: Diagnosis not present

## 2020-11-11 DIAGNOSIS — G20 Parkinson's disease: Secondary | ICD-10-CM | POA: Diagnosis not present

## 2020-11-11 DIAGNOSIS — F22 Delusional disorders: Secondary | ICD-10-CM | POA: Diagnosis not present

## 2020-11-11 DIAGNOSIS — M25511 Pain in right shoulder: Secondary | ICD-10-CM | POA: Diagnosis not present

## 2020-11-12 DIAGNOSIS — M25511 Pain in right shoulder: Secondary | ICD-10-CM | POA: Diagnosis not present

## 2020-11-13 DIAGNOSIS — M25511 Pain in right shoulder: Secondary | ICD-10-CM | POA: Diagnosis not present

## 2020-11-15 DIAGNOSIS — M25511 Pain in right shoulder: Secondary | ICD-10-CM | POA: Diagnosis not present

## 2020-11-25 DIAGNOSIS — H2513 Age-related nuclear cataract, bilateral: Secondary | ICD-10-CM | POA: Diagnosis not present

## 2020-12-20 DIAGNOSIS — Z23 Encounter for immunization: Secondary | ICD-10-CM | POA: Diagnosis not present

## 2021-01-07 DIAGNOSIS — N390 Urinary tract infection, site not specified: Secondary | ICD-10-CM | POA: Diagnosis not present

## 2021-01-10 DIAGNOSIS — F39 Unspecified mood [affective] disorder: Secondary | ICD-10-CM | POA: Diagnosis not present

## 2021-01-10 DIAGNOSIS — K219 Gastro-esophageal reflux disease without esophagitis: Secondary | ICD-10-CM | POA: Diagnosis not present

## 2021-01-10 DIAGNOSIS — G3183 Dementia with Lewy bodies: Secondary | ICD-10-CM | POA: Diagnosis not present

## 2021-01-10 DIAGNOSIS — M1990 Unspecified osteoarthritis, unspecified site: Secondary | ICD-10-CM | POA: Diagnosis not present

## 2021-01-10 DIAGNOSIS — G20 Parkinson's disease: Secondary | ICD-10-CM | POA: Diagnosis not present

## 2021-01-10 DIAGNOSIS — F22 Delusional disorders: Secondary | ICD-10-CM | POA: Diagnosis not present

## 2021-02-06 DIAGNOSIS — R251 Tremor, unspecified: Secondary | ICD-10-CM | POA: Diagnosis not present

## 2021-02-06 DIAGNOSIS — G4711 Idiopathic hypersomnia with long sleep time: Secondary | ICD-10-CM | POA: Diagnosis not present

## 2021-02-06 DIAGNOSIS — G20 Parkinson's disease: Secondary | ICD-10-CM | POA: Diagnosis not present

## 2021-02-06 DIAGNOSIS — R413 Other amnesia: Secondary | ICD-10-CM | POA: Diagnosis not present

## 2021-02-06 DIAGNOSIS — G4752 REM sleep behavior disorder: Secondary | ICD-10-CM | POA: Diagnosis not present

## 2021-02-10 DIAGNOSIS — I10 Essential (primary) hypertension: Secondary | ICD-10-CM | POA: Diagnosis not present

## 2021-03-03 DIAGNOSIS — R413 Other amnesia: Secondary | ICD-10-CM | POA: Diagnosis not present

## 2021-03-03 DIAGNOSIS — G4711 Idiopathic hypersomnia with long sleep time: Secondary | ICD-10-CM | POA: Diagnosis not present

## 2021-03-03 DIAGNOSIS — Z20828 Contact with and (suspected) exposure to other viral communicable diseases: Secondary | ICD-10-CM | POA: Diagnosis not present

## 2021-03-03 DIAGNOSIS — R441 Visual hallucinations: Secondary | ICD-10-CM | POA: Diagnosis not present

## 2021-03-03 DIAGNOSIS — R251 Tremor, unspecified: Secondary | ICD-10-CM | POA: Diagnosis not present

## 2021-03-03 DIAGNOSIS — G20 Parkinson's disease: Secondary | ICD-10-CM | POA: Diagnosis not present

## 2021-03-03 DIAGNOSIS — G4752 REM sleep behavior disorder: Secondary | ICD-10-CM | POA: Diagnosis not present

## 2021-03-17 DIAGNOSIS — K219 Gastro-esophageal reflux disease without esophagitis: Secondary | ICD-10-CM | POA: Diagnosis not present

## 2021-03-17 DIAGNOSIS — F02A2 Dementia in other diseases classified elsewhere, mild, with psychotic disturbance: Secondary | ICD-10-CM | POA: Diagnosis not present

## 2021-03-17 DIAGNOSIS — M159 Polyosteoarthritis, unspecified: Secondary | ICD-10-CM | POA: Diagnosis not present

## 2021-03-17 DIAGNOSIS — F39 Unspecified mood [affective] disorder: Secondary | ICD-10-CM | POA: Diagnosis not present

## 2021-03-17 DIAGNOSIS — G20 Parkinson's disease: Secondary | ICD-10-CM | POA: Diagnosis not present

## 2021-04-11 DIAGNOSIS — M5432 Sciatica, left side: Secondary | ICD-10-CM | POA: Diagnosis not present

## 2021-04-16 DIAGNOSIS — R251 Tremor, unspecified: Secondary | ICD-10-CM | POA: Diagnosis not present

## 2021-04-16 DIAGNOSIS — G4711 Idiopathic hypersomnia with long sleep time: Secondary | ICD-10-CM | POA: Diagnosis not present

## 2021-04-16 DIAGNOSIS — N390 Urinary tract infection, site not specified: Secondary | ICD-10-CM | POA: Diagnosis not present

## 2021-04-16 DIAGNOSIS — G4752 REM sleep behavior disorder: Secondary | ICD-10-CM | POA: Diagnosis not present

## 2021-04-16 DIAGNOSIS — R42 Dizziness and giddiness: Secondary | ICD-10-CM | POA: Diagnosis not present

## 2021-04-16 DIAGNOSIS — R441 Visual hallucinations: Secondary | ICD-10-CM | POA: Diagnosis not present

## 2021-04-16 DIAGNOSIS — G20 Parkinson's disease: Secondary | ICD-10-CM | POA: Diagnosis not present

## 2021-04-16 DIAGNOSIS — R413 Other amnesia: Secondary | ICD-10-CM | POA: Diagnosis not present

## 2021-04-17 DIAGNOSIS — I10 Essential (primary) hypertension: Secondary | ICD-10-CM | POA: Diagnosis not present

## 2021-05-01 DIAGNOSIS — R1032 Left lower quadrant pain: Secondary | ICD-10-CM | POA: Diagnosis not present

## 2021-05-01 DIAGNOSIS — M25511 Pain in right shoulder: Secondary | ICD-10-CM | POA: Diagnosis not present

## 2021-05-06 DIAGNOSIS — M25511 Pain in right shoulder: Secondary | ICD-10-CM | POA: Diagnosis not present

## 2021-05-08 DIAGNOSIS — M25511 Pain in right shoulder: Secondary | ICD-10-CM | POA: Diagnosis not present

## 2021-05-13 DIAGNOSIS — M25511 Pain in right shoulder: Secondary | ICD-10-CM | POA: Diagnosis not present

## 2021-05-14 DIAGNOSIS — F0280 Dementia in other diseases classified elsewhere without behavioral disturbance: Secondary | ICD-10-CM | POA: Diagnosis not present

## 2021-05-14 DIAGNOSIS — M25511 Pain in right shoulder: Secondary | ICD-10-CM | POA: Diagnosis not present

## 2021-05-14 DIAGNOSIS — K219 Gastro-esophageal reflux disease without esophagitis: Secondary | ICD-10-CM | POA: Diagnosis not present

## 2021-05-14 DIAGNOSIS — F39 Unspecified mood [affective] disorder: Secondary | ICD-10-CM | POA: Diagnosis not present

## 2021-05-14 DIAGNOSIS — G20 Parkinson's disease: Secondary | ICD-10-CM | POA: Diagnosis not present

## 2021-05-14 DIAGNOSIS — M1990 Unspecified osteoarthritis, unspecified site: Secondary | ICD-10-CM | POA: Diagnosis not present

## 2021-05-14 DIAGNOSIS — F22 Delusional disorders: Secondary | ICD-10-CM | POA: Diagnosis not present

## 2021-05-14 DIAGNOSIS — R413 Other amnesia: Secondary | ICD-10-CM | POA: Diagnosis not present

## 2021-05-14 DIAGNOSIS — F528 Other sexual dysfunction not due to a substance or known physiological condition: Secondary | ICD-10-CM | POA: Diagnosis not present

## 2021-05-14 DIAGNOSIS — R441 Visual hallucinations: Secondary | ICD-10-CM | POA: Diagnosis not present

## 2021-05-14 DIAGNOSIS — R251 Tremor, unspecified: Secondary | ICD-10-CM | POA: Diagnosis not present

## 2021-05-14 DIAGNOSIS — G4711 Idiopathic hypersomnia with long sleep time: Secondary | ICD-10-CM | POA: Diagnosis not present

## 2021-05-14 DIAGNOSIS — G4752 REM sleep behavior disorder: Secondary | ICD-10-CM | POA: Diagnosis not present

## 2021-05-15 DIAGNOSIS — M25511 Pain in right shoulder: Secondary | ICD-10-CM | POA: Diagnosis not present

## 2021-05-20 DIAGNOSIS — R918 Other nonspecific abnormal finding of lung field: Secondary | ICD-10-CM | POA: Diagnosis not present

## 2021-05-20 DIAGNOSIS — J22 Unspecified acute lower respiratory infection: Secondary | ICD-10-CM | POA: Diagnosis not present

## 2021-05-21 ENCOUNTER — Emergency Department: Payer: Medicare Other

## 2021-05-21 ENCOUNTER — Encounter: Payer: Self-pay | Admitting: Emergency Medicine

## 2021-05-21 ENCOUNTER — Other Ambulatory Visit: Payer: Self-pay

## 2021-05-21 ENCOUNTER — Inpatient Hospital Stay
Admission: EM | Admit: 2021-05-21 | Discharge: 2021-05-25 | DRG: 871 | Disposition: A | Discharge status: Skilled Nursing Facility | Payer: Medicare Other | Source: Skilled Nursing Facility | Attending: Internal Medicine | Admitting: Internal Medicine

## 2021-05-21 DIAGNOSIS — K219 Gastro-esophageal reflux disease without esophagitis: Secondary | ICD-10-CM | POA: Diagnosis not present

## 2021-05-21 DIAGNOSIS — R1310 Dysphagia, unspecified: Secondary | ICD-10-CM | POA: Diagnosis present

## 2021-05-21 DIAGNOSIS — Z85828 Personal history of other malignant neoplasm of skin: Secondary | ICD-10-CM | POA: Diagnosis not present

## 2021-05-21 DIAGNOSIS — I10 Essential (primary) hypertension: Secondary | ICD-10-CM | POA: Diagnosis not present

## 2021-05-21 DIAGNOSIS — Z79899 Other long term (current) drug therapy: Secondary | ICD-10-CM | POA: Diagnosis not present

## 2021-05-21 DIAGNOSIS — Z515 Encounter for palliative care: Secondary | ICD-10-CM | POA: Diagnosis not present

## 2021-05-21 DIAGNOSIS — Z66 Do not resuscitate: Secondary | ICD-10-CM | POA: Diagnosis not present

## 2021-05-21 DIAGNOSIS — J984 Other disorders of lung: Secondary | ICD-10-CM | POA: Diagnosis not present

## 2021-05-21 DIAGNOSIS — F0280 Dementia in other diseases classified elsewhere without behavioral disturbance: Secondary | ICD-10-CM | POA: Diagnosis not present

## 2021-05-21 DIAGNOSIS — Z888 Allergy status to other drugs, medicaments and biological substances status: Secondary | ICD-10-CM

## 2021-05-21 DIAGNOSIS — Z7189 Other specified counseling: Secondary | ICD-10-CM | POA: Diagnosis not present

## 2021-05-21 DIAGNOSIS — G20B2 Parkinson's disease with dyskinesia, with fluctuations: Secondary | ICD-10-CM | POA: Diagnosis present

## 2021-05-21 DIAGNOSIS — R0902 Hypoxemia: Secondary | ICD-10-CM | POA: Diagnosis not present

## 2021-05-21 DIAGNOSIS — G9341 Metabolic encephalopathy: Secondary | ICD-10-CM | POA: Diagnosis present

## 2021-05-21 DIAGNOSIS — Z96642 Presence of left artificial hip joint: Secondary | ICD-10-CM | POA: Diagnosis present

## 2021-05-21 DIAGNOSIS — Z882 Allergy status to sulfonamides status: Secondary | ICD-10-CM | POA: Diagnosis not present

## 2021-05-21 DIAGNOSIS — F02818 Dementia in other diseases classified elsewhere, unspecified severity, with other behavioral disturbance: Secondary | ICD-10-CM | POA: Diagnosis present

## 2021-05-21 DIAGNOSIS — R5381 Other malaise: Secondary | ICD-10-CM | POA: Diagnosis not present

## 2021-05-21 DIAGNOSIS — A419 Sepsis, unspecified organism: Secondary | ICD-10-CM | POA: Diagnosis not present

## 2021-05-21 DIAGNOSIS — Z87891 Personal history of nicotine dependence: Secondary | ICD-10-CM

## 2021-05-21 DIAGNOSIS — Z8249 Family history of ischemic heart disease and other diseases of the circulatory system: Secondary | ICD-10-CM

## 2021-05-21 DIAGNOSIS — R509 Fever, unspecified: Secondary | ICD-10-CM | POA: Diagnosis not present

## 2021-05-21 DIAGNOSIS — Z20822 Contact with and (suspected) exposure to covid-19: Secondary | ICD-10-CM | POA: Diagnosis present

## 2021-05-21 DIAGNOSIS — J9601 Acute respiratory failure with hypoxia: Secondary | ICD-10-CM

## 2021-05-21 DIAGNOSIS — G20A1 Parkinson's disease without dyskinesia, without mention of fluctuations: Secondary | ICD-10-CM | POA: Diagnosis present

## 2021-05-21 DIAGNOSIS — R059 Cough, unspecified: Secondary | ICD-10-CM | POA: Diagnosis not present

## 2021-05-21 DIAGNOSIS — G20 Parkinson's disease: Secondary | ICD-10-CM | POA: Diagnosis not present

## 2021-05-21 DIAGNOSIS — R4182 Altered mental status, unspecified: Secondary | ICD-10-CM | POA: Diagnosis not present

## 2021-05-21 DIAGNOSIS — J189 Pneumonia, unspecified organism: Secondary | ICD-10-CM | POA: Diagnosis not present

## 2021-05-21 DIAGNOSIS — R456 Violent behavior: Secondary | ICD-10-CM | POA: Diagnosis not present

## 2021-05-21 DIAGNOSIS — G4752 REM sleep behavior disorder: Secondary | ICD-10-CM | POA: Diagnosis present

## 2021-05-21 DIAGNOSIS — E785 Hyperlipidemia, unspecified: Secondary | ICD-10-CM | POA: Diagnosis present

## 2021-05-21 DIAGNOSIS — R0689 Other abnormalities of breathing: Secondary | ICD-10-CM | POA: Diagnosis not present

## 2021-05-21 DIAGNOSIS — Z7401 Bed confinement status: Secondary | ICD-10-CM | POA: Diagnosis not present

## 2021-05-21 DIAGNOSIS — R6520 Severe sepsis without septic shock: Secondary | ICD-10-CM | POA: Diagnosis present

## 2021-05-21 DIAGNOSIS — R400 Somnolence: Secondary | ICD-10-CM | POA: Diagnosis not present

## 2021-05-21 DIAGNOSIS — R000 Tachycardia, unspecified: Secondary | ICD-10-CM | POA: Diagnosis not present

## 2021-05-21 DIAGNOSIS — I959 Hypotension, unspecified: Secondary | ICD-10-CM | POA: Diagnosis not present

## 2021-05-21 DIAGNOSIS — R0602 Shortness of breath: Secondary | ICD-10-CM | POA: Diagnosis not present

## 2021-05-21 LAB — PROTIME-INR
INR: 1.1 (ref 0.8–1.2)
Prothrombin Time: 13.8 seconds (ref 11.4–15.2)

## 2021-05-21 LAB — COMPREHENSIVE METABOLIC PANEL
ALT: <5 U/L (ref 0–44)
AST: 15 U/L (ref 15–41)
Albumin: 3.8 g/dL (ref 3.5–5.0)
Alkaline Phosphatase: 61 U/L (ref 38–126)
Anion gap: 10 (ref 5–15)
BUN: 21 mg/dL (ref 8–23)
CO2: 23 mmol/L (ref 22–32)
Calcium: 8.3 mg/dL — ABNORMAL LOW (ref 8.9–10.3)
Chloride: 101 mmol/L (ref 98–111)
Creatinine, Ser: 1.17 mg/dL (ref 0.61–1.24)
GFR, Estimated: >60 mL/min (ref >60)
Glucose, Bld: 110 mg/dL — ABNORMAL HIGH (ref 70–99)
Potassium: 4 mmol/L (ref 3.5–5.1)
Sodium: 134 mmol/L — ABNORMAL LOW (ref 135–145)
Total Bilirubin: 1.5 mg/dL — ABNORMAL HIGH (ref 0.3–1.2)
Total Protein: 6.7 g/dL (ref 6.5–8.1)

## 2021-05-21 LAB — CBC WITH DIFFERENTIAL/PLATELET
Abs Immature Granulocytes: 0.04 10*3/uL (ref 0.00–0.07)
Basophils Absolute: 0 10*3/uL (ref 0.0–0.1)
Basophils Relative: 0 %
Eosinophils Absolute: 0 10*3/uL (ref 0.0–0.5)
Eosinophils Relative: 0 %
HCT: 39.9 % (ref 39.0–52.0)
Hemoglobin: 13 g/dL (ref 13.0–17.0)
Immature Granulocytes: 0 %
Lymphocytes Relative: 5 %
Lymphs Abs: 0.5 10*3/uL — ABNORMAL LOW (ref 0.7–4.0)
MCH: 29.1 pg (ref 26.0–34.0)
MCHC: 32.6 g/dL (ref 30.0–36.0)
MCV: 89.5 fL (ref 80.0–100.0)
Monocytes Absolute: 0.8 10*3/uL (ref 0.1–1.0)
Monocytes Relative: 8 %
Neutro Abs: 8.7 10*3/uL — ABNORMAL HIGH (ref 1.7–7.7)
Neutrophils Relative %: 87 %
Platelets: 156 10*3/uL (ref 150–400)
RBC: 4.46 MIL/uL (ref 4.22–5.81)
RDW: 13.8 % (ref 11.5–15.5)
WBC: 10.1 10*3/uL (ref 4.0–10.5)
nRBC: 0 % (ref 0.0–0.2)

## 2021-05-21 LAB — RESP PANEL BY RT-PCR (FLU A&B, COVID) ARPGX2
Influenza A by PCR: NEGATIVE
Influenza B by PCR: NEGATIVE
SARS Coronavirus 2 by RT PCR: NEGATIVE

## 2021-05-21 LAB — LACTIC ACID, PLASMA: Lactic Acid, Venous: 0.9 mmol/L (ref 0.5–1.9)

## 2021-05-21 LAB — APTT: aPTT: 37 seconds — ABNORMAL HIGH (ref 24–36)

## 2021-05-21 MED ORDER — ACETAMINOPHEN 650 MG RE SUPP: 650.0000 mg | Freq: Four times a day (QID) | RECTAL | Status: DC | PRN

## 2021-05-21 MED ORDER — PANTOPRAZOLE SODIUM 40 MG PO TBEC
40.0000 mg | DELAYED_RELEASE_TABLET | Freq: Every day | ORAL | Status: DC
  Administered 2021-05-24: 40 mg via ORAL
  Filled 2021-05-21: qty 1

## 2021-05-21 MED ORDER — POLYETHYLENE GLYCOL 3350 17 G PO PACK: 17.0000 g | PACK | Freq: Every day | ORAL | Status: DC | PRN

## 2021-05-21 MED ORDER — ACETAMINOPHEN 325 MG PO TABS: 650.0000 mg | ORAL_TABLET | Freq: Four times a day (QID) | ORAL | Status: DC | PRN

## 2021-05-21 MED ORDER — SODIUM CHLORIDE 0.9 % IV SOLN
2.0000 g | Freq: Once | INTRAVENOUS | Status: AC
  Administered 2021-05-21: 2 g via INTRAVENOUS
  Filled 2021-05-21: qty 2

## 2021-05-21 MED ORDER — LACTATED RINGERS IV SOLN: INTRAVENOUS | Status: AC

## 2021-05-21 MED ORDER — IPRATROPIUM-ALBUTEROL 0.5-2.5 (3) MG/3ML IN SOLN
3.0000 mL | Freq: Once | RESPIRATORY_TRACT | Status: AC
  Administered 2021-05-21: 3 mL via RESPIRATORY_TRACT
  Filled 2021-05-21: qty 3

## 2021-05-21 MED ORDER — ONDANSETRON HCL 4 MG/2ML IJ SOLN: 4.0000 mg | Freq: Four times a day (QID) | INTRAMUSCULAR | Status: DC | PRN

## 2021-05-21 MED ORDER — METRONIDAZOLE 500 MG/100ML IV SOLN
500.0000 mg | Freq: Once | INTRAVENOUS | Status: AC
  Administered 2021-05-21: 500 mg via INTRAVENOUS
  Filled 2021-05-21: qty 100

## 2021-05-21 MED ORDER — ONDANSETRON HCL 4 MG PO TABS: 4.0000 mg | ORAL_TABLET | Freq: Four times a day (QID) | ORAL | Status: DC | PRN

## 2021-05-21 MED ORDER — SODIUM CHLORIDE 0.9 % IV BOLUS (SEPSIS)
1000.0000 mL | Freq: Once | INTRAVENOUS | Status: AC
  Administered 2021-05-22: 1000 mL via INTRAVENOUS

## 2021-05-21 MED ORDER — VANCOMYCIN HCL IN DEXTROSE 1-5 GM/200ML-% IV SOLN: 1000.0000 mg | Freq: Once | INTRAVENOUS | Status: DC

## 2021-05-21 MED ORDER — DOCUSATE SODIUM 100 MG PO CAPS: 100.0000 mg | ORAL_CAPSULE | Freq: Four times a day (QID) | ORAL | Status: DC

## 2021-05-21 MED ORDER — ENOXAPARIN SODIUM 40 MG/0.4ML IJ SOSY
40.0000 mg | PREFILLED_SYRINGE | INTRAMUSCULAR | Status: DC
  Administered 2021-05-22 – 2021-05-25 (×4): 40 mg via SUBCUTANEOUS
  Filled 2021-05-21 (×4): qty 0.4

## 2021-05-21 MED ORDER — SODIUM CHLORIDE 0.9 % IV SOLN
500.0000 mg | INTRAVENOUS | Status: DC
  Administered 2021-05-22 – 2021-05-24 (×4): 500 mg via INTRAVENOUS
  Filled 2021-05-21 (×4): qty 5
  Filled 2021-05-21 (×2): qty 500

## 2021-05-21 MED ORDER — SODIUM CHLORIDE 0.9 % IV BOLUS (SEPSIS)
1000.0000 mL | Freq: Once | INTRAVENOUS | Status: AC
  Administered 2021-05-21: 1000 mL via INTRAVENOUS

## 2021-05-21 MED ORDER — DONEPEZIL HCL 5 MG PO TABS: 10.0000 mg | ORAL_TABLET | Freq: Every evening | ORAL | Status: DC

## 2021-05-21 MED ORDER — IOHEXOL 350 MG/ML SOLN
100.0000 mL | Freq: Once | INTRAVENOUS | Status: AC | PRN
  Administered 2021-05-21: 100 mL via INTRAVENOUS

## 2021-05-21 MED ORDER — SODIUM CHLORIDE 0.9 % IV SOLN
2.0000 g | INTRAVENOUS | Status: AC
  Administered 2021-05-22 – 2021-05-25 (×4): 2 g via INTRAVENOUS
  Filled 2021-05-21 (×2): qty 20
  Filled 2021-05-21: qty 2
  Filled 2021-05-21: qty 20

## 2021-05-21 MED ORDER — VANCOMYCIN HCL 1750 MG/350ML IV SOLN
1750.0000 mg | Freq: Once | INTRAVENOUS | Status: AC
  Administered 2021-05-22: 1750 mg via INTRAVENOUS
  Filled 2021-05-21: qty 350

## 2021-05-21 NOTE — Progress Notes (Signed)
CODE SEPSIS - PHARMACY COMMUNICATION  **Broad Spectrum Antibiotics should be administered within 1 hour of Sepsis diagnosis**  Time Code Sepsis Called/Page Received: 2220  Antibiotics Ordered: Cefepime and Vancomycin  Time of 1st antibiotic administration: 2232  Renda Rolls, PharmD, [facility] 05/21/2021 11:40 PM

## 2021-05-21 NOTE — ED Provider Notes (Signed)
[facility] Provider Note    None    (approximate)   History   Shortness of Breath and Cough   HPI  Lucas Edwards is a 71 y.o. male here with shortness of breath and cough.  Patient has a history of Parkinson's, dementia, limiting history.  Patient unable to provide much history, limiting H&P.  Per report, patient has had cough and shortness of breath for several days.  On EMS arrival, patient was satting in the low 80s, improved on 15 L nonrebreather.  Patient said 2 falls, due to reportedly being more agitated/uncooperative.  Level 5 caveat invoked as remainder of history, ROS, and physical exam limited due to patient's AMS.      Physical Exam   Triage Vital Signs: ED Triage Vitals  Enc Vitals Group     BP 05/21/21 2207 131/88     Pulse Rate 05/21/21 2207 (!) 108     Resp 05/21/21 2207 (!) 28     Temp 05/21/21 2207 (!) 100.8 F (38.2 C)     Temp Source 05/21/21 2207 Axillary     SpO2 05/21/21 2207 98 %     Weight 05/21/21 2210 158 lb 11.7 oz (72 kg)     Height 05/21/21 2210 5\' 9"  (1.753 m)     Head Circumference --      Peak Flow --      Pain Score --      Pain Loc --      Pain Edu? --      Excl. in [place]? --     Most recent vital signs: Vitals:   05/22/21 0013 05/22/21 0030  BP:  135/80  Pulse:  (!) 109  Resp:  (!) 23  Temp: (!) 101.1 F (38.4 C)   SpO2:  92%     General: Awake, no distress.  CV:  Good peripheral perfusion.  Tachycardic.  No murmurs. Resp:  Increased work of breathing with bilateral rails.  Mild wheezing noted. Abd:  No distention.  Other:  Disoriented to person, place, time.  Moves all extremities.  Does follow commands.   ED Results / Procedures / Treatments   Labs (all labs ordered are listed, but only abnormal results are displayed) Labs Reviewed  COMPREHENSIVE METABOLIC PANEL - Abnormal; Notable for the following components:      Result Value   Sodium 134 (*)    Glucose, Bld 110 (*)    Calcium  8.3 (*)    Total Bilirubin 1.5 (*)    All other components within normal limits  CBC WITH DIFFERENTIAL/PLATELET - Abnormal; Notable for the following components:   Neutro Abs 8.7 (*)    Lymphs Abs 0.5 (*)    All other components within normal limits  APTT - Abnormal; Notable for the following components:   aPTT 37 (*)    All other components within normal limits  RESP PANEL BY RT-PCR (FLU A&B, COVID) ARPGX2  CULTURE, BLOOD (ROUTINE X 2)  CULTURE, BLOOD (ROUTINE X 2)  URINE CULTURE  MRSA NEXT GEN BY PCR, NASAL  LACTIC ACID, PLASMA  LACTIC ACID, PLASMA  PROTIME-INR  PROCALCITONIN  URINALYSIS, COMPLETE (UACMP) WITH MICROSCOPIC  BASIC METABOLIC PANEL  MAGNESIUM  PHOSPHORUS  CBC WITH DIFFERENTIAL/PLATELET  HIV ANTIBODY (ROUTINE TESTING W REFLEX)     EKG Sinus tachycardia, ventricular rate 104.  PR 140, QRS 81, QTc 425.  No acute ST elevations or depressions.   RADIOLOGY Chest x-ray: Patchy bibasilar airspace disease consistent with pneumonia  I also independently reviewed and agree wit radiologist interpretations.   PROCEDURES:  Critical Care performed: Yes, see critical care procedure note(s)  .Critical Care Performed by: Duffy Bruce, MD Authorized by: Duffy Bruce, MD   Critical care provider statement:    Critical care time (minutes):  30   Critical care time was exclusive of:  Separately billable procedures and treating other patients   Critical care was necessary to treat or prevent imminent or life-threatening deterioration of the following conditions:  Cardiac failure, circulatory failure and respiratory failure   Critical care was time spent personally by me on the following activities:  Development of treatment plan with patient or surrogate, discussions with consultants, evaluation of patient's response to treatment, examination of patient, ordering and review of laboratory studies, ordering and review of radiographic studies, ordering and performing  treatments and interventions, pulse oximetry, re-evaluation of patient's condition and review of old charts .1-3 Lead EKG Interpretation Performed by: Duffy Bruce, MD Authorized by: Duffy Bruce, MD     Interpretation: abnormal     ECG rate:  100-120   ECG rate assessment: tachycardic     Rhythm: sinus rhythm     Ectopy: none     Conduction: normal   Comments:     Indication: SOB, cough    MEDICATIONS ORDERED IN ED: Medications  lactated ringers infusion (has no administration in time range)  vancomycin (VANCOREADY) IVPB 1750 mg/350 mL (has no administration in time range)  pantoprazole (PROTONIX) EC tablet 40 mg (has no administration in time range)  docusate sodium (COLACE) capsule 100 mg (has no administration in time range)  polyethylene glycol (MIRALAX / GLYCOLAX) packet 17 g (has no administration in time range)  acetaminophen (TYLENOL) tablet 650 mg (has no administration in time range)    Or  acetaminophen (TYLENOL) suppository 650 mg (has no administration in time range)  ondansetron (ZOFRAN) tablet 4 mg (has no administration in time range)    Or  ondansetron (ZOFRAN) injection 4 mg (has no administration in time range)  enoxaparin (LOVENOX) injection 40 mg (has no administration in time range)  cefTRIAXone (ROCEPHIN) 2 g in sodium chloride 0.9 % 100 mL IVPB (has no administration in time range)  azithromycin (ZITHROMAX) 500 mg in sodium chloride 0.9 % 250 mL IVPB (500 mg Intravenous New Bag/Given 05/22/21 0048)  ketorolac (TORADOL) 30 MG/ML injection 15 mg (15 mg Intravenous Given 05/22/21 0041)  acetaminophen (OFIRMEV) IV 1,000 mg (has no administration in time range)  lactated ringers bolus 1,000 mL (has no administration in time range)  ceFEPIme (MAXIPIME) 2 g in sodium chloride 0.9 % 100 mL IVPB (0 g Intravenous Stopped 05/21/21 2311)  metroNIDAZOLE (FLAGYL) IVPB 500 mg (0 mg Intravenous Stopped 05/22/21 0049)  sodium chloride 0.9 % bolus 1,000 mL (0 mLs  Intravenous Stopped 05/21/21 2354)  ipratropium-albuterol (DUONEB) 0.5-2.5 (3) MG/3ML nebulizer solution 3 mL (3 mLs Nebulization Given 05/21/21 2234)  ipratropium-albuterol (DUONEB) 0.5-2.5 (3) MG/3ML nebulizer solution 3 mL (3 mLs Nebulization Given 05/21/21 2233)  iohexol (OMNIPAQUE) 350 MG/ML injection 100 mL (100 mLs Intravenous Contrast Given 05/21/21 2344)  sodium chloride 0.9 % bolus 1,000 mL (1,000 mLs Intravenous New Bag/Given 05/22/21 0049)     IMPRESSION / MDM / ASSESSMENT AND PLAN / ED COURSE  I reviewed the triage vital signs and the nursing notes.  The patient is on the cardiac monitor to evaluate for evidence of arrhythmia and/or significant heart rate changes.   Ddx:  Sepsis 2/2 CAP, influenza, COVID-19, UTI   MDM:  71 yo M with PMHx Parkinson's, dementia, here with fever, AMS, hypoxia. Suspect sepsis 2/2 PNA, with marked fever, tachycardia, and hypoxia on arrival. Pt improved on NRB but is dyspneic, tachypneic. Sepsis protocol initiated with broad-spectrum ABX started, with HCAP coverage. COVID, flu negative. Labs are overall reassuring, with surprisingly no significant leukocytosis. BMP unremarkable. EKG nonischemic. LA is normal. No signs of severe sepsis. CXR is concerning, however, for multifocal PNA. COVID negative. Will admit for tx of sepsis 2/2 CAP. Will obtain CT Angio as well given his significant hypoxia and labs that do not appear to show other signs of sepsis/severe infection, to evaluate for PE or alternative etiology.    MEDICATIONS GIVEN IN ED: Medications  lactated ringers infusion (has no administration in time range)  vancomycin (VANCOREADY) IVPB 1750 mg/350 mL (has no administration in time range)  pantoprazole (PROTONIX) EC tablet 40 mg (has no administration in time range)  docusate sodium (COLACE) capsule 100 mg (has no administration in time range)  polyethylene glycol (MIRALAX / GLYCOLAX) packet 17 g (has no  administration in time range)  acetaminophen (TYLENOL) tablet 650 mg (has no administration in time range)    Or  acetaminophen (TYLENOL) suppository 650 mg (has no administration in time range)  ondansetron (ZOFRAN) tablet 4 mg (has no administration in time range)    Or  ondansetron (ZOFRAN) injection 4 mg (has no administration in time range)  enoxaparin (LOVENOX) injection 40 mg (has no administration in time range)  cefTRIAXone (ROCEPHIN) 2 g in sodium chloride 0.9 % 100 mL IVPB (has no administration in time range)  azithromycin (ZITHROMAX) 500 mg in sodium chloride 0.9 % 250 mL IVPB (500 mg Intravenous New Bag/Given 05/22/21 0048)  ketorolac (TORADOL) 30 MG/ML injection 15 mg (15 mg Intravenous Given 05/22/21 0041)  acetaminophen (OFIRMEV) IV 1,000 mg (has no administration in time range)  lactated ringers bolus 1,000 mL (has no administration in time range)  ceFEPIme (MAXIPIME) 2 g in sodium chloride 0.9 % 100 mL IVPB (0 g Intravenous Stopped 05/21/21 2311)  metroNIDAZOLE (FLAGYL) IVPB 500 mg (0 mg Intravenous Stopped 05/22/21 0049)  sodium chloride 0.9 % bolus 1,000 mL (0 mLs Intravenous Stopped 05/21/21 2354)  ipratropium-albuterol (DUONEB) 0.5-2.5 (3) MG/3ML nebulizer solution 3 mL (3 mLs Nebulization Given 05/21/21 2234)  ipratropium-albuterol (DUONEB) 0.5-2.5 (3) MG/3ML nebulizer solution 3 mL (3 mLs Nebulization Given 05/21/21 2233)  iohexol (OMNIPAQUE) 350 MG/ML injection 100 mL (100 mLs Intravenous Contrast Given 05/21/21 2344)  sodium chloride 0.9 % bolus 1,000 mL (1,000 mLs Intravenous New Bag/Given 05/22/21 0049)     Consults:  Hospitalist Dr. Tobie Poet for admission   EMR reviewed  Neurology clinic notes Prior ED visits   FINAL CLINICAL IMPRESSION(S) / ED DIAGNOSES   Final diagnoses:  Sepsis due to pneumonia ([place])  Acute respiratory failure with hypoxemia ([place])     Rx / DC Orders   ED Discharge Orders     None        Note:  This document was prepared using  Dragon voice recognition software and may include unintentional dictation errors.   Duffy Bruce, MD 05/22/21 [phone]

## 2021-05-21 NOTE — ED Triage Notes (Signed)
Pt to ED via [facility] from [facility] for Shortness of breath and cough. Per EMS pt started having cough for multiple days.  Per EMS pt Sp02 reading low 80's on RA, EMS placed pt on 15L NRB, pt at 98% upon assessment.  Pt has hx of Dementia and parkinson's.  PTA pt had 2 falls on back with EMS due to pt being uncooperative.

## 2021-05-21 NOTE — Progress Notes (Signed)
PHARMACY -  BRIEF ANTIBIOTIC NOTE   Pharmacy has received consult(s) for Cefepime and Vancomycin from an ED provider.  The patient's profile has been reviewed for ht/wt/allergies/indication/available labs.    One time order(s) placed for Cefepime 2 gm and Vancomycin 1750 mg per pt wt 72 kg.  Further antibiotics/pharmacy consults should be ordered by admitting physician if indicated.                       Thank you, Renda Rolls, PharmD, [facility] 05/21/2021 10:27 PM

## 2021-05-22 ENCOUNTER — Other Ambulatory Visit: Payer: Self-pay

## 2021-05-22 ENCOUNTER — Encounter: Payer: Self-pay | Admitting: Internal Medicine

## 2021-05-22 ENCOUNTER — Inpatient Hospital Stay: Payer: Medicare Other

## 2021-05-22 DIAGNOSIS — Z8249 Family history of ischemic heart disease and other diseases of the circulatory system: Secondary | ICD-10-CM | POA: Diagnosis not present

## 2021-05-22 DIAGNOSIS — J189 Pneumonia, unspecified organism: Secondary | ICD-10-CM | POA: Diagnosis present

## 2021-05-22 DIAGNOSIS — A419 Sepsis, unspecified organism: Secondary | ICD-10-CM | POA: Diagnosis present

## 2021-05-22 DIAGNOSIS — R400 Somnolence: Secondary | ICD-10-CM | POA: Diagnosis not present

## 2021-05-22 DIAGNOSIS — Z888 Allergy status to other drugs, medicaments and biological substances status: Secondary | ICD-10-CM | POA: Diagnosis not present

## 2021-05-22 DIAGNOSIS — E785 Hyperlipidemia, unspecified: Secondary | ICD-10-CM | POA: Diagnosis present

## 2021-05-22 DIAGNOSIS — R5381 Other malaise: Secondary | ICD-10-CM | POA: Diagnosis not present

## 2021-05-22 DIAGNOSIS — K219 Gastro-esophageal reflux disease without esophagitis: Secondary | ICD-10-CM

## 2021-05-22 DIAGNOSIS — Z85828 Personal history of other malignant neoplasm of skin: Secondary | ICD-10-CM | POA: Diagnosis not present

## 2021-05-22 DIAGNOSIS — R6520 Severe sepsis without septic shock: Secondary | ICD-10-CM | POA: Diagnosis present

## 2021-05-22 DIAGNOSIS — Z20822 Contact with and (suspected) exposure to covid-19: Secondary | ICD-10-CM | POA: Diagnosis present

## 2021-05-22 DIAGNOSIS — G20 Parkinson's disease: Secondary | ICD-10-CM

## 2021-05-22 DIAGNOSIS — I10 Essential (primary) hypertension: Secondary | ICD-10-CM | POA: Diagnosis present

## 2021-05-22 DIAGNOSIS — Z96642 Presence of left artificial hip joint: Secondary | ICD-10-CM | POA: Diagnosis present

## 2021-05-22 DIAGNOSIS — F02818 Dementia in other diseases classified elsewhere, unspecified severity, with other behavioral disturbance: Secondary | ICD-10-CM | POA: Diagnosis present

## 2021-05-22 DIAGNOSIS — F0280 Dementia in other diseases classified elsewhere without behavioral disturbance: Secondary | ICD-10-CM | POA: Diagnosis not present

## 2021-05-22 DIAGNOSIS — Z87891 Personal history of nicotine dependence: Secondary | ICD-10-CM | POA: Diagnosis not present

## 2021-05-22 DIAGNOSIS — Z66 Do not resuscitate: Secondary | ICD-10-CM | POA: Diagnosis present

## 2021-05-22 DIAGNOSIS — Z7189 Other specified counseling: Secondary | ICD-10-CM | POA: Diagnosis not present

## 2021-05-22 DIAGNOSIS — Z882 Allergy status to sulfonamides status: Secondary | ICD-10-CM | POA: Diagnosis not present

## 2021-05-22 DIAGNOSIS — Z515 Encounter for palliative care: Secondary | ICD-10-CM | POA: Diagnosis not present

## 2021-05-22 DIAGNOSIS — Z79899 Other long term (current) drug therapy: Secondary | ICD-10-CM | POA: Diagnosis not present

## 2021-05-22 DIAGNOSIS — J9601 Acute respiratory failure with hypoxia: Secondary | ICD-10-CM | POA: Diagnosis present

## 2021-05-22 DIAGNOSIS — R4182 Altered mental status, unspecified: Secondary | ICD-10-CM | POA: Diagnosis present

## 2021-05-22 DIAGNOSIS — R1310 Dysphagia, unspecified: Secondary | ICD-10-CM | POA: Diagnosis present

## 2021-05-22 DIAGNOSIS — Z7401 Bed confinement status: Secondary | ICD-10-CM | POA: Diagnosis not present

## 2021-05-22 DIAGNOSIS — G9341 Metabolic encephalopathy: Secondary | ICD-10-CM | POA: Diagnosis present

## 2021-05-22 LAB — PHOSPHORUS: Phosphorus: 3.2 mg/dL (ref 2.5–4.6)

## 2021-05-22 LAB — MAGNESIUM: Magnesium: 1.9 mg/dL (ref 1.7–2.4)

## 2021-05-22 LAB — URINALYSIS, COMPLETE (UACMP) WITH MICROSCOPIC
Bacteria, UA: NONE SEEN
Bilirubin Urine: NEGATIVE
Glucose, UA: NEGATIVE mg/dL
Hgb urine dipstick: NEGATIVE
Ketones, ur: 5 mg/dL — AB
Leukocytes,Ua: NEGATIVE
Nitrite: NEGATIVE
Protein, ur: NEGATIVE mg/dL
Specific Gravity, Urine: 1.027 (ref 1.005–1.030)
Squamous Epithelial / HPF: NONE SEEN (ref 0–5)
pH: 5 (ref 5.0–8.0)

## 2021-05-22 LAB — BLOOD GAS, ARTERIAL
Acid-Base Excess: 0.3 mmol/L (ref 0.0–2.0)
Bicarbonate: 23.5 mmol/L (ref 20.0–28.0)
O2 Content: 6 L/min
O2 Saturation: 84.7 %
Patient temperature: 37
pCO2 arterial: 33 mmHg (ref 32–48)
pH, Arterial: 7.46 — ABNORMAL HIGH (ref 7.35–7.45)
pO2, Arterial: 48 mmHg — ABNORMAL LOW (ref 83–108)

## 2021-05-22 LAB — CBC WITH DIFFERENTIAL/PLATELET
Abs Immature Granulocytes: 0.02 10*3/uL (ref 0.00–0.07)
Basophils Absolute: 0 10*3/uL (ref 0.0–0.1)
Basophils Relative: 0 %
Eosinophils Absolute: 0 10*3/uL (ref 0.0–0.5)
Eosinophils Relative: 0 %
HCT: 38.8 % — ABNORMAL LOW (ref 39.0–52.0)
Hemoglobin: 12.6 g/dL — ABNORMAL LOW (ref 13.0–17.0)
Immature Granulocytes: 0 %
Lymphocytes Relative: 8 %
Lymphs Abs: 0.6 10*3/uL — ABNORMAL LOW (ref 0.7–4.0)
MCH: 29.6 pg (ref 26.0–34.0)
MCHC: 32.5 g/dL (ref 30.0–36.0)
MCV: 91.1 fL (ref 80.0–100.0)
Monocytes Absolute: 0.5 10*3/uL (ref 0.1–1.0)
Monocytes Relative: 7 %
Neutro Abs: 6.2 10*3/uL (ref 1.7–7.7)
Neutrophils Relative %: 85 %
Platelets: 125 10*3/uL — ABNORMAL LOW (ref 150–400)
RBC: 4.26 MIL/uL (ref 4.22–5.81)
RDW: 13.9 % (ref 11.5–15.5)
Smear Review: NORMAL
WBC: 7.3 10*3/uL (ref 4.0–10.5)
nRBC: 0 % (ref 0.0–0.2)

## 2021-05-22 LAB — BASIC METABOLIC PANEL
Anion gap: 8 (ref 5–15)
BUN: 17 mg/dL (ref 8–23)
CO2: 23 mmol/L (ref 22–32)
Calcium: 7.6 mg/dL — ABNORMAL LOW (ref 8.9–10.3)
Chloride: 107 mmol/L (ref 98–111)
Creatinine, Ser: 1.17 mg/dL (ref 0.61–1.24)
GFR, Estimated: >60 mL/min (ref >60)
Glucose, Bld: 111 mg/dL — ABNORMAL HIGH (ref 70–99)
Potassium: 4.4 mmol/L (ref 3.5–5.1)
Sodium: 138 mmol/L (ref 135–145)

## 2021-05-22 LAB — LACTIC ACID, PLASMA: Lactic Acid, Venous: 1.5 mmol/L (ref 0.5–1.9)

## 2021-05-22 LAB — MRSA NEXT GEN BY PCR, NASAL: MRSA by PCR Next Gen: NOT DETECTED

## 2021-05-22 LAB — PROCALCITONIN: Procalcitonin: 0.41 ng/mL

## 2021-05-22 LAB — HIV ANTIBODY (ROUTINE TESTING W REFLEX): HIV Screen 4th Generation wRfx: NONREACTIVE

## 2021-05-22 MED ORDER — ACETAMINOPHEN 10 MG/ML IV SOLN
1000.0000 mg | Freq: Four times a day (QID) | INTRAVENOUS | Status: AC | PRN
  Filled 2021-05-22: qty 100

## 2021-05-22 MED ORDER — CARBIDOPA-LEVODOPA 25-100 MG PO TABS: 1.0000 | ORAL_TABLET | Freq: Three times a day (TID) | ORAL | Status: DC

## 2021-05-22 MED ORDER — KETOROLAC TROMETHAMINE 30 MG/ML IJ SOLN
15.0000 mg | Freq: Four times a day (QID) | INTRAMUSCULAR | Status: AC | PRN
  Administered 2021-05-22: 15 mg via INTRAVENOUS
  Filled 2021-05-22: qty 1

## 2021-05-22 MED ORDER — LACTATED RINGERS IV BOLUS
1000.0000 mL | Freq: Once | INTRAVENOUS | Status: AC
  Administered 2021-05-22: 1000 mL via INTRAVENOUS

## 2021-05-22 MED ORDER — VANCOMYCIN HCL 1500 MG/300ML IV SOLN: 1500.0000 mg | INTRAVENOUS | Status: DC

## 2021-05-22 NOTE — ED Notes (Signed)
Lab at bedside

## 2021-05-22 NOTE — H&P (Addendum)
History and Physical   Lucas Edwards BSJ:[id number] DOB: 07-Apr-1950 DOA: 05/21/2021  PCP: Venia Carbon, MD  Outpatient Specialists: [name], neurology Patient coming from: [facility]  I have personally briefly reviewed patient's old medical records in [place].  Chief Concern: Cough and shortness of breath  HPI: Lucas Edwards is a 71 year old male with history of dementia, GERD, who presents to the emergency department for chief concerns of shortness of breath and cough from [facility].  Initial vitals in the emergency department show temperature of 100.8, respiration rate of 28, heart rate of 81, blood pressure 131/88, SPO2 of 99% on high flow nasal cannula.  Serum sodium 134, potassium 4.0, chloride 100, bicarb 23, BUN of 21, serum creatinine of 1.17, nonfasting blood glucose 110, GFR greater than 60, WBC 10.1, hemoglobin 13, platelets of 156.  Lactic acid was 0.9.  COVID/influenza A/influenza B PCR were negative.  Portable chest x-ray showed right lower lobe pneumonia.  ED treatment: DuoNeb x2, cefepime 2 g IV, LR 150 mL/h, Flagyl 500 mg IV, sodium chloride 1 L bolus, vancomycin.  At bedside patient has high flow nasal cannula in place.  Patient is arousable with deep sternal rub and mumbles.  Does not open eyes on his own.  Per niece, Lucas Edwards called her stating that patient had a fever and that they had ordered a chest x-ray and ordered a Z-Pak for patient.  Per niece, patient told staff that he was feeling cold.  Per niece, patient was not as responsive as he is baseline.  At baseline, patient able to tell you his name, age, occasionally will participate in activities at the facility. He has trouble with short memories. He can remember long term memories.   Social history: Patient is from [facility].  Patient's niece is his healthcare power of attorney, Lucas Edwards.  ROS: Unable to complete due to patient dementia and altered mental status  ED  Course: Discussed with emergency medicine provider, patient requiring hospitalization for chief concerns of pneumonia.  Assessment/Plan  Principal Problem:   PNA (pneumonia) Active Problems:   REM behavioral disorder   Essential hypertension, benign   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HCC)   GERD (gastroesophageal reflux disease)   Acute hypoxemic respiratory failure (HCC)   Altered mental state   Severe sepsis (HCC)   Severe sepsis (HCC) Assessment & Plan - Secondary to pneumonia - With acute hypoxic respiratory failure - Continue antibiotics as above, high flow nasal cannula - Check procalcitonin, lactic acid - As needed Toradol, acetaminophen p.o./rectal, IV acetaminophen placed - Admit to inpatient stepdown  Respiratory Acute hypoxemic respiratory failure (HCC) Assessment & Plan - Continue high flow nasal cannula - Admit to stepdown, inpatient  * PNA (pneumonia) Assessment & Plan - Met sepsis criteria with fever, increased heart rate, increased respiration rate, source of pneumonia - Ceftriaxone and azithromycin - Blood cultures x2  Nervous and Auditory 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[place]) Assessment & Plan - with dementia - Outpatient medication: Carbidopa-levodopa 50-200 take at 8 PM - Carbidopa-levodopa 25-100 tablet every 2 hours, take 3 tablets 4 AM, 3 tablets 6 AM, 3 tablets at 8 AM, 3 tablets 10 AM, 3 tablets 12 PM, 3 tablets at 2 PM, 3 tablets 4 PM, 3 tablets 6 PM, 3 tablets 8 PM, - Patient is not a candidate for p.o. intake at this time  Altered mental state Assessment & Plan - CT of the head without contrast has been ordered - Presumed  secondary to severe sepsis  REM behavioral disorder Assessment & Plan - Patient is prescribed outpatient Seroquel 25 mg nightly, did not resume - I am holding all sedating medications  Discussed Tylenol allergy with patient's niece.  She states that he can take it and that she is never known  him to have a reaction to Tylenol.  Chart reviewed.   DVT prophylaxis: Enoxaparin Code Status: DNR/DNI, MOST form at bedside, and confirmed with niece who is healthcare power of attorney Diet: N.p.o. Family Communication: Updated [name], niece over the phone.  She prefers to be called at [phone] Disposition Plan: Pending clinical course, guarded prognosis Consults called: None at this time Admission status: Stepdown, inpatient  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[place])    Essential hypertension, benign    GERD (gastroesophageal reflux disease)    Hyperlipidemia    Parkinson disease ([place])    Squamous cell carcinoma of skin 04/22/2017   mid vertex scalp   Squamous cell carcinoma of skin 06/05/2019   crown scalp   Urinary incontinence    Past Surgical History:  Procedure Laterality Date   HIP ARTHROPLASTY Left 08/06/2017   Procedure: ARTHROPLASTY BIPOLAR HIP (HEMIARTHROPLASTY);  Surgeon: Corky Mull, MD;  Location: [facility] ORS;  Service: Orthopedics;  Laterality: Left;   INGUINAL HERNIA REPAIR Bilateral    VASECTOMY     Social History:  reports that he has quit smoking. He has never used smokeless tobacco. He reports current alcohol use. He reports that he does not use drugs.  Allergies  Allergen Reactions   Lorazepam Other (See Comments)    confusion   Sulfa Antibiotics Hives   Family History  Problem Relation Age of Onset   Multiple sclerosis Mother        died @ 30   Stroke Father        died @ 53   Heart attack Father    Lung cancer Sister        died in her 44's   Family history: Family history reviewed and not pertinent.  Prior to Admission medications   Medication Sig Start Date End Date Taking? Authorizing Provider  carbidopa-levodopa (SINEMET IR) 25-100 MG tablet Take 1 tablet by mouth 3 (three) times daily. And 2 tabs 5  times per day    [provider]  docusate sodium (COLACE) 100 MG capsule Take 100 mg by mouth 4 (four) times daily.     [provider]  donepezil (ARICEPT) 10 MG tablet Take 10 mg by mouth every evening.     [provider]  entacapone (COMTAN) 200 MG tablet Take 200 mg by mouth 4 (four) times daily.    [provider]  pantoprazole (PROTONIX) 40 MG tablet Take 40 mg by mouth daily.  10/21/13   [provider]  Polyethyl Glycol-Propyl Glycol 0.4-0.3 % SOLN Place 1 application into both eyes daily as needed (dry eyes).    [provider]  polyethylene glycol (MIRALAX / GLYCOLAX) packet Take 17 g by mouth every other day.    [provider]  senna-docusate (SENOKOT-S) 8.6-50 MG tablet Take 1 tablet by mouth 2 (two) times daily.    [provider]   Physical Exam: Vitals:   05/21/21 2230 05/21/21 2309 05/22/21 0013 05/22/21 0030  BP:  (!) 149/81  135/80  Pulse:  (!) 119  Marland Kitchen)  109  Resp:  (!) 26  (!) 23  Temp:   (!) 101.1 F (38.4 C)   TempSrc:   Axillary   SpO2: 95% 96%  92%  Weight:      Height:       Constitutional: appears older than chronological age, male, NAD, calm, comfortable Eyes: PERRL, lids and conjunctivae normal ENMT: Mucous membranes are dry. Posterior pharynx clear of any exudate or lesions. Age-appropriate dentition.  Unable to assess hearing Neck: normal, supple, no masses, no thyromegaly Respiratory: clear to auscultation bilaterally, no wheezing, no crackles. Normal respiratory effort. No accessory muscle use.  Cardiovascular: Regular rate and rhythm, no murmurs / rubs / gallops. No extremity edema. 2+ pedal pulses. No carotid bruits.  Abdomen: no tenderness, no masses palpated, no hepatosplenomegaly. Bowel sounds positive.  Musculoskeletal: no clubbing / cyanosis. No joint deformity upper and lower extremities. Good ROM, no contractures, no atrophy. Normal muscle tone.  Skin: no rashes, lesions,  ulcers. No induration Neurologic: Unable to assess sensation and strength. Psychiatric: Lacks udgment and insight.  Not awake, alert.  Unable to assess mood  EKG: independently reviewed, showing sinus tachycardia with rate of 104, QTc 425  Chest x-ray on Admission: I personally reviewed and I agree with radiologist reading as below.  CT Angio Chest PE W and/or Wo Contrast  Result Date: 05/21/2021 CLINICAL DATA:  Pulmonary embolism (PE) suspected, high prob. Shortness of breath and cough. Per EMS pt started having cough for multiple days. Per EMS pt Sp02 reading low 80's on RA EXAM: CT ANGIOGRAPHY CHEST WITH CONTRAST TECHNIQUE: Multidetector CT imaging of the chest was performed using the standard protocol during bolus administration of intravenous contrast. Multiplanar CT image reconstructions and MIPs were obtained to evaluate the vascular anatomy. RADIATION DOSE REDUCTION: This exam was performed according to the departmental dose-optimization program which includes automated exposure control, adjustment of the mA and/or kV according to patient size and/or use of iterative reconstruction technique. CONTRAST:  148mL OMNIPAQUE IOHEXOL 350 MG/ML SOLN COMPARISON:  None. FINDINGS: Cardiovascular: Satisfactory opacification of the pulmonary arteries to the segmental level. No evidence of pulmonary embolism. Normal heart size. No significant pericardial effusion. The thoracic aorta is normal in caliber. Mild atherosclerotic plaque of the thoracic aorta. No coronary artery calcifications. Mediastinum/Nodes: No enlarged mediastinal, hilar, or axillary lymph nodes. Thyroid gland, trachea, and esophagus demonstrate no significant findings. Lungs/Pleura: Patchy bilateral lower lobe airspace opacities that are more consolidative along the peribronchovascular regions. Similar findings to a lesser extent within the posterior bilateral upper lobes. Upper Abdomen: No acute abnormality. Musculoskeletal: No chest wall  abnormality. No suspicious lytic or blastic osseous lesions. No acute displaced fracture. Multilevel mild degenerative changes of the spine. Review of the MIP images confirms the above findings. IMPRESSION: 1. No pulmonary embolus. 2. Patchy bilateral lower lobe airspace opacities that are more consolidative along the peribronchovascular regions. Similar findings to a lesser extent within the posterior bilateral upper lobes. Findings suggestive of infection/inflammation. Differential diagnosis includes alveolar hemorrhage. Consider 3 month CT follow-up to ensure resolution and exclude underlying malignancy. 3.  Aortic Atherosclerosis (ICD10-I70.0). Electronically Signed   By: Iven Finn M.D.   On: 05/21/2021 23:56   DG Chest Port 1 View  Result Date: 05/21/2021 CLINICAL DATA:  Questionable sepsis - evaluate for abnormality Cough and shortness of breath. EXAM: PORTABLE CHEST 1 VIEW COMPARISON:  Radiograph 04/23/2018 FINDINGS: There is patchy bibasilar airspace disease, right greater than left. The heart is normal in size. Stable mediastinal contours. No pulmonary edema,  pleural effusion, or pneumothorax. No acute osseous abnormalities are seen. IMPRESSION: Patchy bibasilar airspace disease, right greater than left, atelectasis versus pneumonia. Electronically Signed   By: Keith Rake M.D.   On: 05/21/2021 22:31    Labs on Admission: I have personally reviewed following labs  CBC: Recent Labs  Lab 05/21/21 2209  WBC 10.1  NEUTROABS 8.7*  HGB 13.0  HCT 39.9  MCV 89.5  PLT 163   Basic Metabolic Panel: Recent Labs  Lab 05/21/21 2209  NA 134*  K 4.0  CL 101  CO2 23  GLUCOSE 110*  BUN 21  CREATININE 1.17  CALCIUM 8.3*   GFR: Estimated Creatinine Clearance: 59.6 mL/min (by C-G formula based on SCr of 1.17 mg/dL).  Liver Function Tests: Recent Labs  Lab 05/21/21 2209  AST 15  ALT <5  ALKPHOS 61  BILITOT 1.5*  PROT 6.7  ALBUMIN 3.8   Coagulation Profile: Recent Labs   Lab 05/21/21 2209  INR 1.1   Urine analysis:    Component Value Date/Time   COLORURINE STRAW (A) 04/23/2018 1731   APPEARANCEUR CLEAR (A) 04/23/2018 1731   APPEARANCEUR Clear 06/24/2013 1454   LABSPEC 1.002 (L) 04/23/2018 1731   LABSPEC 1.023 06/24/2013 1454   PHURINE 8.0 04/23/2018 1731   GLUCOSEU NEGATIVE 04/23/2018 1731   GLUCOSEU Negative 06/24/2013 1454   HGBUR MODERATE (A) 04/23/2018 1731   BILIRUBINUR NEGATIVE 04/23/2018 1731   BILIRUBINUR Negative 06/24/2013 1454   KETONESUR NEGATIVE 04/23/2018 1731   PROTEINUR NEGATIVE 04/23/2018 1731   NITRITE NEGATIVE 04/23/2018 1731   LEUKOCYTESUR NEGATIVE 04/23/2018 1731   LEUKOCYTESUR Negative 06/24/2013 1454   CRITICAL CARE Performed by: Briant Cedar Laetitia Schnepf  Total critical care time: 40 minutes  Critical care time was exclusive of separately billable procedures and treating other patients.  Critical care was necessary to treat or prevent imminent or life-threatening deterioration.  Critical care was time spent personally by me on the following activities: development of treatment plan with patient and/or surrogate as well as nursing, discussions with consultants, evaluation of patient's response to treatment, examination of patient, obtaining history from patient or surrogate, ordering and performing treatments and interventions, ordering and review of laboratory studies, ordering and review of radiographic studies, pulse oximetry and re-evaluation of patient's condition.  Dr. Tobie Poet [facility]  If 7PM-7AM, please contact overnight-coverage provider If 7AM-7PM, please contact day coverage provider [email or web address]  05/22/2021, 1:14 AM

## 2021-05-22 NOTE — Progress Notes (Signed)
SLP Cancellation Note  Patient Details Name: Lucas Edwards MRN: [id number] DOB: 28-Jun-1950   Cancelled treatment:       Reason Eval/Treat Not Completed: Fatigue/lethargy limiting ability to participate. Orders for bedside swallow assessment received. MD/RN reporting pt with fatigue/lethargy, leading to increased concern for swallow safety. MD/RN in agreement to hold eval until pt is more alert. SLP will follow as schedule allows and when pt appropriate.   [name] MS CCC-SLP   [place] J Clapp 05/22/2021, 11:49 AM

## 2021-05-22 NOTE — Progress Notes (Signed)
PROGRESS NOTE  Lucas Edwards  OVF:[id number] DOB: 12/03/1950 DOA: 05/21/2021 PCP: Venia Carbon, MD   Brief Narrative: Patient is a 71 year old male with history of dementia, GERD who was sent from [facility] facility for evaluation of shortness of breath, cough.  On presentation he was mildly febrile, otherwise he was hemodynamically stable.  COVID/influenza PCR negative.  Chest imaging showed patchy bilateral lower lobe opacities consistent with pneumonia.  Patient required high flow oxygen to maintain his saturation.  Patient was admitted for the management of sepsis, pneumonia.  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  CT chest did not show any PE but showed bilateral patchy opacities in the bases.  Severe hypoxia on presentation, requiring high flow oxygen.  Started on broad-spectrum antibiotics.  Follow-up cultures  Sepsis: Presented with fever,hypoxia.  Afebrile today.  Follow-up cultures.  Continue current antibiotics  Pneumonia: Chest imagings as above.  Continue current antibiotics  Altered mental status: Secondary to metabolic encephalopathy from  hypoxia from pneumonia.  Monitor mental status.  CT head denies any acute intracranial abnormalities  History of dementia: Advanced dementia.  Not oriented at baseline.  On carbidopa-levodopa, Zyprexa, Seroquel.  Patient is sleepy/drowsy, this medications on hold.  Continue delirium precautions. Ambulates with walker  Goals of care: Patient with advanced dementia presented with severe pneumonia, hypoxia.  Poor prognosis.  CODE STATUS is DNR.  Palliative care  consulted for goals of care            DVT prophylaxis:enoxaparin (LOVENOX) injection 40 mg Start: 05/22/21 0800 Place TED hose Start: 05/21/21 2333     Code Status: DNR  Family Communication: Long discussion held with niece on the phone on 2/23  Patient status: Inpatient  Patient is from : Nursing facility  Anticipated discharge to: Nursing facility  Estimated DC date: Not sure at present   Consultants: Palliative care  Procedures: None  Antimicrobials:  Anti-infectives (From admission, onward)    Start     Dose/Rate Route Frequency Ordered Stop   05/23/21 0200  vancomycin (VANCOREADY) IVPB 1500 mg/300 mL        1,500 mg 150 mL/hr over 120 Minutes Intravenous Every 24 hours 05/22/21 0352     05/22/21 1000  cefTRIAXone (ROCEPHIN) 2 g in sodium chloride 0.9 % 100 mL IVPB        2 g 200 mL/hr over 30 Minutes Intravenous Every 24 hours 05/21/21 2333 05/26/21 0959   05/21/21 2345  azithromycin (ZITHROMAX) 500 mg in sodium chloride 0.9 % 250 mL IVPB        500 mg 250 mL/hr over 60 Minutes Intravenous Every 24 hours 05/21/21 2333 05/26/21 2344   05/21/21 2230  ceFEPIme (MAXIPIME) 2 g in sodium chloride 0.9 % 100 mL IVPB        2 g 200 mL/hr over 30 Minutes Intravenous  Once 05/21/21 2219 05/21/21 2311   05/21/21 2230  metroNIDAZOLE (FLAGYL) IVPB 500 mg        500 mg 100 mL/hr over 60 Minutes Intravenous  Once 05/21/21 2219 05/22/21 0049   05/21/21 2230  vancomycin (VANCOCIN) IVPB 1000 mg/200 mL premix  Status:  Discontinued  1,000 mg 200 mL/hr over 60 Minutes Intravenous  Once 05/21/21 2219 05/21/21 2224   05/21/21 2230  vancomycin (VANCOREADY) IVPB 1750 mg/350 mL        1,750 mg 175 mL/hr over 120 Minutes Intravenous  Once 05/21/21 2224 05/22/21 0428       Subjective:  Patient seen and examined at the bedside this morning.  Hemodynamically stable during my evaluation.  On high flow oxygen.  He was in deep sleep, did not respond on calling his name.  Not  in distress.  Objective: Vitals:   05/22/21 0930 05/22/21 1000 05/22/21 1030 05/22/21 1100  BP: 122/78 136/88 135/79 135/81  Pulse: 83 79 81 77  Resp: 17 15  16   Temp:      TempSrc:      SpO2: 95% 98% 97% 96%  Weight:      Height:        Intake/Output Summary (Last 24 hours) at 05/22/2021 1106 Last data filed at 05/22/2021 0428 Gross per 24 hour  Intake 1350 ml  Output --  Net 1350 ml   Filed Weights   05/21/21 2210  Weight: 72 kg    Examination:  General exam: Drowsy/sleepy HEENT: PERRL Respiratory system: Diminished air sounds on bases but no frank wheezes or crackles  Cardiovascular system: S1 & S2 heard, RRR.  Gastrointestinal system: Abdomen is nondistended, soft and nontender. Central nervous system: Not alert or awake Extremities: No edema, no clubbing ,no cyanosis Skin: No rashes, no ulcers,no icterus     Data Reviewed: I have personally reviewed following labs and imaging studies  CBC: Recent Labs  Lab 05/21/21 2209 05/22/21 0735  WBC 10.1 7.3  NEUTROABS 8.7* 6.2  HGB 13.0 12.6*  HCT 39.9 38.8*  MCV 89.5 91.1  PLT 156 474*   Basic Metabolic Panel: Recent Labs  Lab 05/21/21 2209 05/22/21 0612  NA 134* 138  K 4.0 4.4  CL 101 107  CO2 23 23  GLUCOSE 110* 111*  BUN 21 17  CREATININE 1.17 1.17  CALCIUM 8.3* 7.6*  MG  --  1.9  PHOS  --  3.2     Recent Results (from the past 240 hour(s))  Blood Culture (routine x 2)     Status: None (Preliminary result)   Collection Time: 05/21/21 10:09 PM   Specimen: BLOOD  Result Value Ref Range Status   Specimen Description BLOOD LEFT WRIST  Final   Special Requests   Final    BOTTLES DRAWN AEROBIC AND ANAEROBIC Blood Culture adequate volume   Culture   Final    NO GROWTH < 12 HOURS Performed at [facility], [address] [place], [place] [address]    Report Status PENDING  Incomplete  Blood Culture (routine x 2)     Status: None (Preliminary result)   Collection Time: 05/21/21 10:09  PM   Specimen: BLOOD  Result Value Ref Range Status   Specimen Description BLOOD LEFT ASSIST CONTROL  Final   Special Requests   Final    BOTTLES DRAWN AEROBIC AND ANAEROBIC Blood Culture adequate volume   Culture   Final    NO GROWTH < 12 HOURS Performed at [facility], [address] [place], [place] [address]    Report Status PENDING  Incomplete  Resp Panel by RT-PCR (Flu A&B, Covid) Nasopharyngeal Swab     Status: None   Collection Time: 05/21/21 10:34 PM   Specimen: Nasopharyngeal Swab; Nasopharyngeal(NP) swabs in vial transport medium  Result Value Ref  Range Status   SARS Coronavirus 2 by RT PCR NEGATIVE NEGATIVE Final    Comment: (NOTE) SARS-CoV-2 target nucleic acids are NOT DETECTED.  The SARS-CoV-2 RNA is generally detectable in upper respiratory specimens during the acute phase of infection. The lowest concentration of SARS-CoV-2 viral copies this assay can detect is 138 copies/mL. A negative result does not preclude SARS-Cov-2 infection and should not be used as the sole basis for treatment or other patient management decisions. A negative result may occur with  improper specimen collection/handling, submission of specimen other than nasopharyngeal swab, presence of viral mutation(s) within the areas targeted by this assay, and inadequate number of viral copies(<138 copies/mL). A negative result must be combined with clinical observations, patient history, and epidemiological information. The expected result is Negative.  Fact Sheet for Patients:  EntrepreneurPulse.com.au  Fact Sheet for Healthcare Providers:  IncredibleEmployment.be  This test is no t yet approved or cleared by the [place] FDA and  has been authorized for detection and/or diagnosis of SARS-CoV-2 by FDA under an Emergency Use Authorization (EUA). This EUA will remain  in effect (meaning this test can be used) for the duration of the COVID-19  declaration under Section 564(b)(1) of the Act, 21 U.S.C.section 360bbb-3(b)(1), unless the authorization is terminated  or revoked sooner.       Influenza A by PCR NEGATIVE NEGATIVE Final   Influenza B by PCR NEGATIVE NEGATIVE Final    Comment: (NOTE) The Xpert Xpress SARS-CoV-2/FLU/RSV plus assay is intended as an aid in the diagnosis of influenza from Nasopharyngeal swab specimens and should not be used as a sole basis for treatment. Nasal washings and aspirates are unacceptable for Xpert Xpress SARS-CoV-2/FLU/RSV testing.  Fact Sheet for Patients: EntrepreneurPulse.com.au  Fact Sheet for Healthcare Providers: IncredibleEmployment.be  This test is not yet approved or cleared by the [place] FDA and has been authorized for detection and/or diagnosis of SARS-CoV-2 by FDA under an Emergency Use Authorization (EUA). This EUA will remain in effect (meaning this test can be used) for the duration of the COVID-19 declaration under Section 564(b)(1) of the Act, 21 U.S.C. section 360bbb-3(b)(1), unless the authorization is terminated or revoked.  Performed at [facility], [address] [place], [place] [address]   MRSA Next Gen by PCR, Nasal     Status: None   Collection Time: 05/22/21  7:59 AM   Specimen: Nasal Swab  Result Value Ref Range Status   MRSA by PCR Next Gen NOT DETECTED NOT DETECTED Final    Comment: (NOTE) The GeneXpert MRSA Assay (FDA approved for NASAL specimens only), is one component of a comprehensive MRSA colonization surveillance program. It is not intended to diagnose MRSA infection nor to guide or monitor treatment for MRSA infections. Test performance is not FDA approved in patients less than 73 years old. Performed at [facility], [address], [place], [place] [address]      Radiology Studies: CT HEAD WO CONTRAST (5MM)  Result Date: 05/22/2021 CLINICAL DATA:  Altered mental status  EXAM: CT HEAD WITHOUT CONTRAST TECHNIQUE: Contiguous axial images were obtained from the base of the skull through the vertex without intravenous contrast. RADIATION DOSE REDUCTION: This exam was performed according to the departmental dose-optimization program which includes automated exposure control, adjustment of the mA and/or kV according to patient size and/or use of iterative reconstruction technique. COMPARISON:  04/06/2018 FINDINGS: Brain: No evidence of acute infarction, hemorrhage, hydrocephalus, extra-axial collection or mass lesion/mass effect. Vascular: No hyperdense vessel or  unexpected calcification. Skull: Normal. Negative for fracture or focal lesion. Sinuses/Orbits: No acute finding. Other: None. IMPRESSION: No acute intracranial abnormality noted. Electronically Signed   By: Inez Catalina M.D.   On: 05/22/2021 01:56   CT Angio Chest PE W and/or Wo Contrast  Result Date: 05/21/2021 CLINICAL DATA:  Pulmonary embolism (PE) suspected, high prob. Shortness of breath and cough. Per EMS pt started having cough for multiple days. Per EMS pt Sp02 reading low 80's on RA EXAM: CT ANGIOGRAPHY CHEST WITH CONTRAST TECHNIQUE: Multidetector CT imaging of the chest was performed using the standard protocol during bolus administration of intravenous contrast. Multiplanar CT image reconstructions and MIPs were obtained to evaluate the vascular anatomy. RADIATION DOSE REDUCTION: This exam was performed according to the departmental dose-optimization program which includes automated exposure control, adjustment of the mA and/or kV according to patient size and/or use of iterative reconstruction technique. CONTRAST:  128mL OMNIPAQUE IOHEXOL 350 MG/ML SOLN COMPARISON:  None. FINDINGS: Cardiovascular: Satisfactory opacification of the pulmonary arteries to the segmental level. No evidence of pulmonary embolism. Normal heart size. No significant pericardial effusion. The thoracic aorta is normal in caliber. Mild  atherosclerotic plaque of the thoracic aorta. No coronary artery calcifications. Mediastinum/Nodes: No enlarged mediastinal, hilar, or axillary lymph nodes. Thyroid gland, trachea, and esophagus demonstrate no significant findings. Lungs/Pleura: Patchy bilateral lower lobe airspace opacities that are more consolidative along the peribronchovascular regions. Similar findings to a lesser extent within the posterior bilateral upper lobes. Upper Abdomen: No acute abnormality. Musculoskeletal: No chest wall abnormality. No suspicious lytic or blastic osseous lesions. No acute displaced fracture. Multilevel mild degenerative changes of the spine. Review of the MIP images confirms the above findings. IMPRESSION: 1. No pulmonary embolus. 2. Patchy bilateral lower lobe airspace opacities that are more consolidative along the peribronchovascular regions. Similar findings to a lesser extent within the posterior bilateral upper lobes. Findings suggestive of infection/inflammation. Differential diagnosis includes alveolar hemorrhage. Consider 3 month CT follow-up to ensure resolution and exclude underlying malignancy. 3.  Aortic Atherosclerosis (ICD10-I70.0). Electronically Signed   By: Iven Finn M.D.   On: 05/21/2021 23:56   DG Chest Port 1 View  Result Date: 05/21/2021 CLINICAL DATA:  Questionable sepsis - evaluate for abnormality Cough and shortness of breath. EXAM: PORTABLE CHEST 1 VIEW COMPARISON:  Radiograph 04/23/2018 FINDINGS: There is patchy bibasilar airspace disease, right greater than left. The heart is normal in size. Stable mediastinal contours. No pulmonary edema, pleural effusion, or pneumothorax. No acute osseous abnormalities are seen. IMPRESSION: Patchy bibasilar airspace disease, right greater than left, atelectasis versus pneumonia. Electronically Signed   By: Keith Rake M.D.   On: 05/21/2021 22:31    Scheduled Meds:  docusate sodium  100 mg Oral QID   enoxaparin (LOVENOX) injection  40  mg Subcutaneous Q24H   pantoprazole  40 mg Oral Daily   Continuous Infusions:  acetaminophen     azithromycin Stopped (05/22/21 0221)   cefTRIAXone (ROCEPHIN)  IV 2 g (05/22/21 0935)   lactated ringers 150 mL/hr at 05/22/21 0343   [START ON 05/23/2021] vancomycin       LOS: 0 days   Shelly Coss, MD [facility] P2/23/2023, 11:06 AM

## 2021-05-22 NOTE — Assessment & Plan Note (Addendum)
-   with dementia - Outpatient medication: Carbidopa-levodopa 50-200 take at 8 PM - Carbidopa-levodopa 25-100 tablet every 2 hours, take 3 tablets 4 AM, 3 tablets 6 AM, 3 tablets at 8 AM, 3 tablets 10 AM, 3 tablets 12 PM, 3 tablets at 2 PM, 3 tablets 4 PM, 3 tablets 6 PM, 3 tablets 8 PM, - Patient is not a candidate for p.o. intake at this time

## 2021-05-22 NOTE — Assessment & Plan Note (Addendum)
-   CT of the head without contrast has been ordered - Presumed secondary to severe sepsis

## 2021-05-22 NOTE — Assessment & Plan Note (Signed)
-  Met sepsis criteria with fever, increased heart rate, increased respiration rate, source of pneumonia - Ceftriaxone and azithromycin - Blood cultures x2

## 2021-05-22 NOTE — Progress Notes (Signed)
Pharmacy Antibiotic Note  Lucas Edwards is a 71 y.o. male admitted on 05/21/2021 with sepsis.  Pharmacy has been consulted for Vancomycin dosing.  Plan: Pt given initial dose of Vancomycin 1750 mg once Vancomycin 1500 mg IV Q 24 hrs.  Goal AUC 400-550. Expected AUC: 537.2 SCr used: 1.17   Pharmacy will continue to follow and will adjust abx dosing whenever warranted.  Height: 5\' 9"  (175.3 cm) Weight: 72 kg (158 lb 11.7 oz) IBW/kg (Calculated) : 70.7  Temp (24hrs), Avg:101 F (38.3 C), Min:100.8 F (38.2 C), Max:101.1 F (38.4 C)  Recent Labs  Lab 05/21/21 2209 05/22/21 0008  WBC 10.1  --   CREATININE 1.17  --   LATICACIDVEN 0.9 1.5    Estimated Creatinine Clearance: 59.6 mL/min (by C-G formula based on SCr of 1.17 mg/dL).    Allergies  Allergen Reactions   Lorazepam Other (See Comments)    confusion   Sulfa Antibiotics Hives    Antimicrobials this admission: 2/22 Cefepime >> x 1 dose 2/22 Flagyl >> x 1 dose 2/23 Azithromycin >> x 5 doses 2/23 Ceftriaxone >> x 4 doses 2/23 Vancomycin >>  Microbiology results: 2/22 BCx: Pending 2/22 UCx: Pending   Thank you for allowing pharmacy to be a part of this patients care.  Renda Rolls, PharmD, MBA 05/22/2021 1:22 AM

## 2021-05-22 NOTE — Assessment & Plan Note (Addendum)
-   Patient is prescribed outpatient Seroquel 25 mg nightly, did not resume - I am holding all sedating medications

## 2021-05-22 NOTE — Assessment & Plan Note (Addendum)
-   Secondary to pneumonia - Check MRSA PCR - With acute hypoxic respiratory failure - Continue antibiotics as above, high flow nasal cannula - Check procalcitonin, lactic acid - As needed Toradol, acetaminophen p.o./rectal, IV acetaminophen placed - Admit to inpatient stepdown

## 2021-05-22 NOTE — ED Notes (Signed)
Floor requesting ABG be done before accepting patient.  Admitting provider secure chat to request order.

## 2021-05-22 NOTE — Assessment & Plan Note (Signed)
-   Continue high flow nasal cannula - Admit to stepdown, inpatient

## 2021-05-22 NOTE — Sepsis Progress Note (Signed)
Following per sepsis protocol   

## 2021-05-22 NOTE — NC FL2 (Signed)
[employer] LEVEL OF CARE SCREENING TOOL     IDENTIFICATION  Patient Name: Lucas Edwards Birthdate: 1950-09-13 Sex: male Admission Date (Current Location): 05/21/2021  [facility] and [place] Number:  Engineering geologist and Address:  [facility], [address], [place], [place] [address]      Provider Number: [id number]  Attending Physician Name and Address:  Shelly Coss, MD  Relative Name and Phone Number:  Romelle Starcher- niece- [phone]    Current Level of Care: Hospital Recommended Level of Care: [place] Prior Approval Number:    Date Approved/Denied:   PASRR Number:    Discharge Plan: SNF    Current Diagnoses: Patient Active Problem List   Diagnosis Date Noted   Acute hypoxemic respiratory failure ([place]) 05/22/2021   Altered mental state 05/22/2021   Severe sepsis ([place]) 05/22/2021   PNA (pneumonia) 05/21/2021   GERD (gastroesophageal reflux disease) 02/26/2018   Essential hypertension, benign    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[place])    Urinary incontinence    REM behavioral disorder 10/24/2013    Orientation RESPIRATION BLADDER Height & Weight     Self  O2 Incontinent Weight: 72 kg Height:  5\' 9"  (175.3 cm)  BEHAVIORAL SYMPTOMS/MOOD NEUROLOGICAL BOWEL NUTRITION STATUS      Incontinent Diet (see discharge summary)  AMBULATORY STATUS COMMUNICATION OF NEEDS Skin   Extensive Assist Verbally Normal                       Personal Care Assistance Level of Assistance  Bathing, Feeding, Dressing Bathing Assistance: Maximum assistance Feeding assistance: Maximum assistance Dressing Assistance: Maximum assistance     Functional Limitations Info  Sight, Hearing, Speech Sight Info: Adequate Hearing Info: Adequate Speech Info: Impaired    SPECIAL CARE FACTORS FREQUENCY                       Contractures Contractures Info: Not present    Additional Factors Info   Code Status, Allergies Code Status Info: DNR Allergies Info: Lorazepam, sulfa antibiotics           Current Medications (05/22/2021):  This is the current hospital active medication list Current Facility-Administered Medications  Medication Dose Route Frequency Provider Last Rate Last Admin   acetaminophen (OFIRMEV) IV 1,000 mg  1,000 mg Intravenous Q6H PRN Cox, Amy N, DO       acetaminophen (TYLENOL) tablet 650 mg  650 mg Oral Q6H PRN Cox, Amy N, DO       Or   acetaminophen (TYLENOL) suppository 650 mg  650 mg Rectal Q6H PRN Cox, Amy N, DO       azithromycin (ZITHROMAX) 500 mg in sodium chloride 0.9 % 250 mL IVPB  500 mg Intravenous Q24H Cox, Amy N, DO   Stopped at 05/22/21 0221   cefTRIAXone (ROCEPHIN) 2 g in sodium chloride 0.9 % 100 mL IVPB  2 g Intravenous Q24H Cox, Amy N, DO   Stopped at 05/22/21 1005   docusate sodium (COLACE) capsule 100 mg  100 mg Oral QID Cox, Amy N, DO       enoxaparin (LOVENOX) injection 40 mg  40 mg Subcutaneous Q24H Cox, Amy N, DO   40 mg at 05/22/21 5537   ketorolac (TORADOL) 30 MG/ML injection 15 mg  15 mg Intravenous Q6H PRN Cox, Amy N, DO   15 mg at 05/22/21 0041   lactated ringers infusion   Intravenous Continuous Adhikari,  Amrit, MD 100 mL/hr at 05/22/21 1136 New Bag/Given (Non-Interop) at 05/22/21 1136   ondansetron (ZOFRAN) tablet 4 mg  4 mg Oral Q6H PRN Cox, Amy N, DO       Or   ondansetron (ZOFRAN) injection 4 mg  4 mg Intravenous Q6H PRN Cox, Amy N, DO       pantoprazole (PROTONIX) EC tablet 40 mg  40 mg Oral Daily Cox, Amy N, DO       polyethylene glycol (MIRALAX / GLYCOLAX) packet 17 g  17 g Oral Daily PRN Cox, Amy N, DO       Current Outpatient Medications  Medication Sig Dispense Refill   amoxicillin-clavulanate (AUGMENTIN) 875-125 MG tablet Take 1 tablet by mouth 2 (two) times daily. FOR 7 DAYS     azithromycin (ZITHROMAX) 250 MG tablet Take 250-500 mg by mouth daily at 12 noon.     carbidopa-levodopa (SINEMET IR) 25-100 MG tablet Take 3-4  tablets by mouth every 2 (two) hours. 4 AM, 6 AM, 8 AM, 10 AM, NOON, 2 PM, 4 PM, 6 PM, (8 PM 3 TABLETS)     escitalopram (LEXAPRO) 10 MG tablet Take 10 mg by mouth daily.     pantoprazole (PROTONIX) 40 MG tablet Take 40 mg by mouth 2 (two) times daily.     senna-docusate (SENOKOT-S) 8.6-50 MG tablet Take 1 tablet by mouth 2 (two) times daily.     traMADol (ULTRAM) 50 MG tablet Take 50 mg by mouth 2 (two) times daily as needed for pain.     amoxicillin (AMOXIL) 500 MG capsule Take 2,000 mg by mouth as directed. Before Dental Procedures     docusate sodium (COLACE) 100 MG capsule Take 200 mg by mouth at bedtime.     donepezil (ARICEPT) 10 MG tablet Take 10 mg by mouth every evening.  (Patient not taking: Reported on 05/22/2021)     entacapone (COMTAN) 200 MG tablet Take 200 mg by mouth 4 (four) times daily. (Patient not taking: Reported on 05/22/2021)     hydrocortisone 2.5 % cream Apply 1 application topically daily at 12 noon.     Lidocaine 4 % PTCH Apply 1 patch topically daily as needed for pain.     meloxicam (MOBIC) 15 MG tablet Take 15 mg by mouth daily as needed for pain.     OLANZapine (ZYPREXA) 5 MG tablet Take 5 mg by mouth at bedtime.     Polyethyl Glycol-Propyl Glycol 0.4-0.3 % SOLN Place 1 application into both eyes daily as needed (dry eyes). (Patient not taking: Reported on 05/22/2021)     polyethylene glycol powder (GLYCOLAX/MIRALAX) 17 GM/SCOOP powder Take 17 g by mouth daily as needed for constipation.     QUEtiapine (SEROQUEL) 25 MG tablet Take 25 mg by mouth at bedtime.     tiZANidine (ZANAFLEX) 2 MG tablet Take 2 mg by mouth every 8 (eight) hours as needed for muscle spasms.       Discharge Medications: Please see discharge summary for a list of discharge medications.  Relevant Imaging Results:  Relevant Lab Results:   Additional Information SS# [id number]  [name], RN

## 2021-05-22 NOTE — ED Notes (Signed)
Attempted unsuccessful straight stick x3 for morning labs.  Able to get one light green tube.  This RN to call lab for lavender tube.

## 2021-05-22 NOTE — Hospital Course (Signed)
Mr. Lucas Edwards is a 71 year old male with history of dementia, GERD, who presents to the emergency department for chief concerns of shortness of breath and cough from [facility].  Initial vitals in the emergency department show temperature of 100.8, respiration rate of 28, heart rate of 81, blood pressure 131/88, SPO2 of 99% on high flow nasal cannula.  Serum sodium 134, potassium 4.0, chloride 100, bicarb 23, BUN of 21, serum creatinine of 1.17, nonfasting blood glucose 110, GFR greater than 60, WBC 10.1, hemoglobin 13, platelets of 156.  Lactic acid was 0.9.  COVID/influenza A/influenza B PCR were negative.  Portable chest x-ray showed right lower lobe pneumonia.  ED treatment: DuoNeb x2, cefepime 2 g IV, LR 150 mL/h, Flagyl 500 mg IV, sodium chloride 1 L bolus, vancomycin.

## 2021-05-22 NOTE — ED Notes (Addendum)
Dr. Tobie Poet notified of temp of 101.1. Pt unable to get tylenol due to allergy. See MAR for new orders

## 2021-05-22 NOTE — Assessment & Plan Note (Signed)
-   with dementia - Outpatient medication: Carbidopa-levodopa 50-200 take at 8 PM - Carbidopa-levodopa 25-100 tablet every 2 hours, take 3 tablets 4 AM, 3 tablets 6 AM, 3 tablets at 8 AM, 3 tablets 10 AM, 3 tablets 12 PM, 3 tablets at 2 PM, 3 tablets 4 PM, 3 tablets 6 PM, 3 tablets 8 PM, - Patient is not a candidate for p.o. intake at this time

## 2021-05-22 NOTE — ED Notes (Signed)
Pt changed into clean gown and brief, male purewick also placed on pt.

## 2021-05-22 NOTE — TOC Initial Note (Signed)
Transition of Care [facility]) - Initial/Assessment Note    Patient Details  Name: Lucas Edwards MRN: [id number] Date of Birth: 09-25-1950  Transition of Care [facility]) CM/SW Contact:    Lucas Hutching, RN Phone Number: 05/22/2021, 12:02 PM  Clinical Narrative:                 Patient admitted to the hospital with pneumonia.  Patient is from [place].  Patient started out in [place] living then moved to ALF and now is in the skilled nursing area of [place] since 2020.  Patient has Parkinson's and Dementia.  Patient's niece, Lucas Edwards is his Chauncey Reading, she lives in [place], [place] spoke with her via phone.  Patient on a good day can walk with a walker and participates in the daily activities at the facility, on a bad day he doesn't get out of bed. Patient is currently on HFNC, he does not wear oxygen at baseline. Lucas Edwards is open to discussions with Palliative Care and Palliative consult has been placed.  TOC will follow.    Expected Discharge Plan: [place] Barriers to Discharge: Continued Medical Work up   Patient Goals and CMS Choice Patient states their goals for this hospitalization and ongoing recovery are:: Hopefully patient will get better and be able to go back to [facility]- open to palliative care- patient is a DNR CMS Medicare.gov Compare Post Acute Care list provided to:: Patient Represenative (must comment) Choice offered to / list presented to : [facility] POA / Guardian (niece- Lucas Edwards)  Expected Discharge Plan and Services Expected Discharge Plan: [place]   Discharge Planning Services: CM Consult Post Acute Care Choice: [place], Resumption of Svcs/PTA Provider Living arrangements for the past 2 months: [place]                 DME Arranged: N/A DME Agency: NA       HH Arranged: NA [place] Agency: NA        Prior Living Arrangements/Services Living arrangements for the past 2 months: [place] Lives with:: Facility Resident Patient language and need for interpreter reviewed:: Yes Do you feel safe going back to the place where you live?: Yes      Need for Family Participation in Patient Care: Yes (Comment) (pneumonia- dementia, parkinsons) Care giver support system in place?: Yes (comment) (niece) Current home services: DME (walker) Criminal Activity/Legal Involvement Pertinent to Current Situation/Hospitalization: No - Comment as needed  Activities of Daily Living      Permission Sought/Granted Permission sought to share information with : Case Manager, Customer service manager, Family Supports Permission granted to share information with : Yes, Verbal Permission Granted  Share Information with NAME: Lucas Edwards granted to share info w AGENCY: [place] granted to share info w Relationship: niece and HCPOA  Permission granted to share info w Contact Information: [phone]  Emotional Assessment Appearance:: Appears older than stated age Attitude/Demeanor/Rapport: Unable to Assess Affect (typically observed): Unable to Assess Orientation: : Oriented to Self Alcohol / Substance Use: Not Applicable Psych Involvement: No (comment)  Admission diagnosis:  PNA (pneumonia) [J18.9] Acute hypoxemic respiratory failure ([place]) [J96.01] Patient Active Problem List   Diagnosis Date Noted   Acute hypoxemic respiratory failure ([place]) 05/22/2021   Altered mental state 05/22/2021   Severe sepsis ([place]) 05/22/2021   PNA (pneumonia) 05/21/2021   GERD (gastroesophageal reflux disease) 02/26/2018   Essential hypertension, benign  Dementia associated with Parkinson's disease [facility])    Urinary incontinence    REM behavioral disorder 10/24/2013   PCP:  Venia Carbon, MD Pharmacy:   [id number] [address], [place] [address] Phone: [phone] Fax: [phone]  [facility] [place], [place] [address] [place] [address] Phone: [phone] Fax: [phone]     Social Determinants of Health (SDOH) Interventions    Readmission Risk Interventions No flowsheet data found.

## 2021-05-23 DIAGNOSIS — J189 Pneumonia, unspecified organism: Secondary | ICD-10-CM | POA: Diagnosis not present

## 2021-05-23 DIAGNOSIS — Z66 Do not resuscitate: Secondary | ICD-10-CM

## 2021-05-23 DIAGNOSIS — Z7189 Other specified counseling: Secondary | ICD-10-CM

## 2021-05-23 DIAGNOSIS — Z515 Encounter for palliative care: Secondary | ICD-10-CM | POA: Diagnosis not present

## 2021-05-23 DIAGNOSIS — G20 Parkinson's disease: Secondary | ICD-10-CM | POA: Diagnosis not present

## 2021-05-23 LAB — BASIC METABOLIC PANEL
Anion gap: 9 (ref 5–15)
BUN: 14 mg/dL (ref 8–23)
CO2: 24 mmol/L (ref 22–32)
Calcium: 8.2 mg/dL — ABNORMAL LOW (ref 8.9–10.3)
Chloride: 108 mmol/L (ref 98–111)
Creatinine, Ser: 0.85 mg/dL (ref 0.61–1.24)
GFR, Estimated: >60 mL/min (ref >60)
Glucose, Bld: 104 mg/dL — ABNORMAL HIGH (ref 70–99)
Potassium: 4.6 mmol/L (ref 3.5–5.1)
Sodium: 141 mmol/L (ref 135–145)

## 2021-05-23 LAB — URINE CULTURE: Culture: NO GROWTH

## 2021-05-23 MED ORDER — OLANZAPINE 5 MG PO TABS
5.0000 mg | ORAL_TABLET | Freq: Every day | ORAL | Status: DC
  Administered 2021-05-23 – 2021-05-24 (×2): 5 mg via ORAL
  Filled 2021-05-23 (×3): qty 1

## 2021-05-23 MED ORDER — DOCUSATE SODIUM 100 MG PO CAPS
100.0000 mg | ORAL_CAPSULE | Freq: Two times a day (BID) | ORAL | Status: DC
  Administered 2021-05-23 – 2021-05-24 (×3): 100 mg via ORAL
  Filled 2021-05-23 (×2): qty 1

## 2021-05-23 MED ORDER — CARBIDOPA-LEVODOPA 25-100 MG PO TABS
4.0000 | ORAL_TABLET | Freq: Every day | ORAL | Status: DC
  Administered 2021-05-23 – 2021-05-24 (×2): 4 via ORAL
  Filled 2021-05-23 (×3): qty 4

## 2021-05-23 MED ORDER — TIZANIDINE HCL 2 MG PO TABS
2.0000 mg | ORAL_TABLET | Freq: Three times a day (TID) | ORAL | Status: DC | PRN
  Administered 2021-05-23 – 2021-05-24 (×2): 2 mg via ORAL
  Filled 2021-05-23 (×3): qty 1

## 2021-05-23 MED ORDER — ESCITALOPRAM OXALATE 10 MG PO TABS
10.0000 mg | ORAL_TABLET | Freq: Every day | ORAL | Status: DC
  Administered 2021-05-23 – 2021-05-24 (×2): 10 mg via ORAL
  Filled 2021-05-23 (×2): qty 1

## 2021-05-23 MED ORDER — DONEPEZIL HCL 5 MG PO TABS
10.0000 mg | ORAL_TABLET | Freq: Every evening | ORAL | Status: DC
  Administered 2021-05-23 – 2021-05-24 (×2): 10 mg via ORAL
  Filled 2021-05-23 (×2): qty 2

## 2021-05-23 MED ORDER — CARBIDOPA-LEVODOPA 25-100 MG PO TABS
3.0000 | ORAL_TABLET | ORAL | Status: DC
  Administered 2021-05-23 – 2021-05-24 (×10): 3 via ORAL
  Filled 2021-05-23 (×9): qty 3

## 2021-05-23 NOTE — Evaluation (Addendum)
Clinical/Bedside Swallow Evaluation Patient Details  Name: Lucas Edwards MRN: [id number] Date of Birth: July 26, 1950  Today's Date: 05/23/2021 Time: SLP Start Time (ACUTE ONLY): 25 SLP Stop Time (ACUTE ONLY): 1340 SLP Time Calculation (min) (ACUTE ONLY): 60 min  Past Medical History:  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[place])    Essential hypertension, benign    GERD (gastroesophageal reflux disease)    Hyperlipidemia    Parkinson disease ([place])    Squamous cell carcinoma of skin 04/22/2017   mid vertex scalp   Squamous cell carcinoma of skin 06/05/2019   crown scalp   Urinary incontinence    Past Surgical History:  Past Surgical History:  Procedure Laterality Date   HIP ARTHROPLASTY Left 08/06/2017   Procedure: ARTHROPLASTY BIPOLAR HIP (HEMIARTHROPLASTY);  Surgeon: Corky Mull, MD;  Location: [facility] ORS;  Service: Orthopedics;  Laterality: Left;   INGUINAL HERNIA REPAIR Bilateral    VASECTOMY     HPI:  Pt is a 71 y.o. male  with past medical history of Dementia, Parkinson's Dis on increased amount of medication, and GERD w/ Esophageal Dilation admitted on 05/21/2021 with shortness of breath and cough.  Chest imaging showed patchy bilateral lower lobe opacities consistent with pneumonia.  Patient required high flow oxygen to maintain his saturation. Patient also with AMS - CT negative. Mental status significantly improving today per chart notes.  He resides at [facility].  Family describes difficulties and that his baseline status fluctuates significantly; pt has good and bad days - on good days he can walk with a walker, on bad days he spends the entire day in bed. Family describes declining status over the past 2 years but the last 6 months have been a much sharper decline - he has had an increasing number of "bad days".    Assessment / Plan /  Recommendation  Clinical Impression  Pt appears to present w/ oropharyngeal phase dysphagia in light of declined Cognitive status; Baseline Parkinson's Dis w/ associated Dementia. Pt was seen for a MBSS in 2020 which revealed oral phase dysphagia(reduced bolus organization and timely lingual/oral management of boluses) and Esophageal phase dysmotility. ANY Cognitive decline and neuromuscular deficits can impact overall awareness/timing of swallow and safety during po tasks which increases risk for aspiration, choking. Pt's risk for aspiration is present but can be reduced when following general aspiration precautions, supporting feeding at meals, and using a modified diet consistency w/ Nectar liquids(increased sensory input from the viscosity helpful).    During this evaluation, he required MOD verbal/tactilevisual cues for follow through during po tasks. He often perseverated on thoughts/verbalizations about his Parkinson's medications saying they have been "messed up since doctors changed them during Covid"; also frequently repeated the timeline for the meds as "2,4,6,8".  Pt consumed trials of ice chips, purees, soft solids and Nectar liquids w/ NO overt clinical s/s of aspiration noted; no overt cough, no decline in vocal quality, and no decline in respiratory status during/post trials. Oral phase was adequate for bolus management and oral clearing of the Nectar liquid and puree boluses given. Mastication and oral phase time w/ soft solids was min increased w/ min decreased bolus organization; similar noted w/ ice chip trials. Pt attempted self-feeding Holding his own Cup for drinking but required support and guidance w/ food trials d/t Cognitive decline. Holding own Cup during drinking improves  safety of swallowing. OM Exam appeared [facility] w/ No unilateral weakness noted. Some confusion w/ OM tasks and oral care.         D/t pt's Baseline, declined Cognitive status w/ Parkinson's Dis. and Dementia w/ risk  for aspiration, recommend initiation of the dysphagia level 3(mech soft) w/ Nectar liquids via Cup; general aspiration precautions; reduce Distractions during meals and engage pt during po's at meal for self-feeding. Pills Crushed in Puree for safer swallowing. Support w/ feeding at meals as needed. MD/NSG updated. ST services recommends follow w/ Palliative Care for [place] and education re: impact of Cognitive decline/Dementia and Parkinson's Dis. on swallowing. ST services can follow pt at discharge for further education as needed -- pt appears at/near his swallowing Baseline. Largely suspect that impact from pt's Dementia and Parkinson's Dis. could hamper upgrade of diet back to thin liquids. Precautions posted in room. Recommend Dietician f/u.  SLP Visit Diagnosis: Dysphagia, oropharyngeal phase (R13.12) (baseline Dementia and Parkinson's Dis.)    Aspiration Risk  Moderate aspiration risk;Risk for inadequate nutrition/hydration    Diet Recommendation   dysphagia level 3(mech soft) w/ Nectar liquids via Cup; general aspiration precautions; reduce Distractions during meals and engage pt during po's at meal for self-feeding. Support w/ feeding at meals as needed -- let pt Hold Cup to drink.   Medication Administration: Crushed with puree    Other  Recommendations Recommended Consults:  (Palliative Care following; Dietician f/u) Oral Care Recommendations: Oral care BID;Oral care before and after PO;Staff/trained caregiver to provide oral care Other Recommendations: Order thickener from pharmacy;Prohibited food (jello, ice cream, thin soups);Remove water pitcher;Have oral suction available    Recommendations for follow up therapy are one component of a multi-disciplinary discharge planning process, led by the attending physician.  Recommendations may be updated based on patient status, additional functional criteria and insurance authorization.  Follow up Recommendations Skilled nursing-short term rehab  (<3 hours/day)      Assistance Recommended at Discharge Frequent or constant Supervision/Assistance  Functional Status Assessment Patient has had a recent decline in their functional status and/or demonstrates limited ability to make significant improvements in function in a reasonable and predictable amount of time  Frequency and Duration  (n/a)   (n/a)       Prognosis Prognosis for Safe Diet Advancement: [place] Barriers to Reach Goals: Cognitive deficits;Time post onset;Severity of deficits;Behavior      Swallow Study   General Date of Onset: 05/21/21 HPI: Pt is a 71 y.o. male  with past medical history of Dementia, Parkinson's Dis on increased amount of medication, and GERD w/ Esophageal Dilation admitted on 05/21/2021 with shortness of breath and cough.  Chest imaging showed patchy bilateral lower lobe opacities consistent with pneumonia.  Patient required high flow oxygen to maintain his saturation. Patient also with AMS - CT negative. Mental status significantly improving today per chart notes.  He resides at [facility].  Family describes difficulties and that his baseline status fluctuates significantly; pt has good and bad days - on good days he can walk with a walker, on bad days he spends the entire day in bed. Family describes declining status over the past 2 years but the last 6 months have been a much sharper decline - he has had an increasing number of "bad days". Type of Study: Bedside Swallow Evaluation Previous Swallow Assessment: MBSS in 2020 - oral phase dysphagia; Esophageal phase dysmotility Diet Prior to this Study: Thin liquids (clears per MD order; last diet rec'd in 2020  was a Regluar diet w/ thin liquids, meats cut well) Temperature Spikes Noted: No (wbc 7.3) Respiratory Status: Nasal cannula (5L) History of Recent Intubation: No Behavior/Cognition: Alert;Cooperative;Pleasant mood;Confused;Distractible;Requires cueing Oral Cavity Assessment: Dry (min) Oral Care  Completed by SLP: Yes Oral Cavity - Dentition: Adequate natural dentition Vision: Functional for self-feeding Self-Feeding Abilities: Able to feed self;Needs assist;Needs set up;Total assist (cues and support) Patient Positioning: Upright in bed (needed full positioning support) Baseline Vocal Quality: Low vocal intensity (adequate) Volitional Cough: Strong (adequate) Volitional Swallow: Unable to elicit    Oral/Motor/Sensory Function Overall Oral Motor/Sensory Function: Within functional limits   Ice Chips Ice chips: Impaired Presentation: Spoon (3 trials; fed) Oral Phase Impairments: Poor awareness of bolus Oral Phase Functional Implications:  (disorganized) Pharyngeal Phase Impairments:  (none)   Thin Liquid Thin Liquid: Not tested Other Comments: oral phase, confusion, coughing w/ thins w/ NSG earlier    Nectar Thick Nectar Thick Liquid: Within functional limits Presentation: Cup;Self Fed (4 ozs) Other Comments: "i got it all"   Honey Thick Honey Thick Liquid: Not tested   Puree Puree: Within functional limits Presentation: Spoon (fed; 8 trials)   Solid     Solid: Impaired (mild) Presentation: Self Fed (supported; 6 trials) Oral Phase Impairments: Poor awareness of bolus;Impaired mastication (disorganized) Oral Phase Functional Implications:  (min extra time needed) Pharyngeal Phase Impairments:  (none)         Orinda Kenner, MS, CCC-SLP Speech Language Pathologist Rehab Services; [place] [phone] (ascom) Julio Zappia 05/23/2021,3:28 PM

## 2021-05-23 NOTE — Consult Note (Signed)
Consultation Note Date: 05/23/2021   Patient Name: Lucas Edwards  DOB: 1950-08-31  MRN: [id number]  Age / Sex: 71 y.o., male  PCP: Lucas Carbon, MD Referring Physician: Shelly Coss, MD  Reason for Consultation: Establishing goals of care  HPI/Patient Profile: 71 y.o. male  with past medical history of dementia, Parkinsons, and GERD admitted on 05/21/2021 with shortness of breath and cough.  Chest imaging showed patchy bilateral lower lobe opacities consistent with pneumonia.  Patient required high flow oxygen to maintain his saturation. Patient also with AMS - CT negative. Mental status significantly improved morning of 2/24. PMT consulted to discuss [place].  Clinical Assessment and Goals of Care: I have reviewed medical records including EPIC notes, labs and imaging, assessed the patient and then spoke with niece/HCPOA Lucas Edwards to discuss diagnosis prognosis, GOC, EOL wishes, disposition and options.  I introduced Palliative Medicine as specialized medical care for people living with serious illness. It focuses on providing relief from the symptoms and stress of a serious illness. The goal is to improve quality of life for both the patient and the family.  We discussed a brief life review of the patient. Lucas Maryland tells me patient worked for 40 years in the [employer] but has been retired about the same time he was diagnosed with Parkinsons - ~2009.   Lucas Maryland tells me of the difficulties with Lucas Edwards's care as his baseline status fluctuates significantly and she is never quite sure what his "normal" is. She tells me he has good and bad days - on good days he can walk with a walker, on bad days he spends the entire day in bed. She tells me he participates in boxing therapy for his Parkinsons at [facility] but she is unsure how often he is able to participate. She tells me of challenges with his nutrition - he recently lost 10  pounds but then gained 4 back. She tells me his mental status severely fluctuates. She shares that his short term memory is completely cone. She tells me he has been declining over the past 2 years but the last 6 months have been a much sharper decline - he has had an increasing number of "bad days".   We discussed patient's current illness and what it means in the larger context of patient's on-going co-morbidities.  Natural disease trajectory and expectations at EOL were discussed. We discussed irreversible, progressive nature of his chronic diseases. Discuss how his chronic disease put him at risk for complications.   I attempted to elicit values and goals of care important to the patient.  We discuss concept of quality of life. Discuss at what point Lucas Edwards would not find his quality of life acceptable and would no longer wish for medical interventions to prolong his life.  We discuss though it appears Lucas Edwards is recovering well  with significant improvement in mental status today - this hospitalization and illness will still be a "hit" to his system and it is likely he will have a new, worse baseline than before. Lucas Maryland agrees with this as she has seen it happen following other hospitalizations. We discuss that at some point it may no longer make sense to pursue aggressive medical care; pursue hosptialization - focusing on his comfort, maintaining his care at [facility] may become more important. She expresses understanding.  We review a MOST form - she knows that DNR is appropriate and agrees to that selection. She also knows Lucas Edwards would never want  a feeding tube. She is flying into town tonight to see Lucas Edwards and attempt to discuss goals with him. She is unsure if they will pursue hospitalization for him in the future.  We discuss concept of surrogate decision making and making decisions that will honor the patient/decisions  he would likely make for himself.   Discussed with  Lucas Maryland the importance of continued conversation with family and the medical providers regarding overall plan of care and treatment options, ensuring decisions are within the context of the patients values and GOCs.    Hospice and Palliative Care services outpatient were explained and offered. Lucas Maryland is interested in extra support of palliative services at [facility] - referral given to liaison - requested follow up soon after discharge.   Also discussed with Lucas Maryland that I would leave "Hard Choices for Lucas Edwards" booklet at patient's bedside for her as it may serve as a helpful aid in the future for difficult medical decisions.   Questions and concerns were addressed. The family was encouraged to call with questions or concerns.   Primary Decision Maker HCPOA/niece Lucas Edwards   SUMMARY OF RECOMMENDATIONS   - initial goals of care conversation complete, maintain DNR, MOST introduced, outpatient palliative referral made - HCPOA is thoughtful about making medical decisions he would likely make for himself, she has concerns about his current quality of life and unsure if he would want to pursue repeat hospitalization - she plans to have more conversations with patient as able, other family members, and outpatient palliative care - hospice referral may become more appropriate in the future  Code Status/Advance Care Planning: DNR   Discharge Planning: [place] for rehab with Palliative care service follow-up      Primary Diagnoses: Present on Admission:  PNA (pneumonia)  Essential hypertension, benign  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[place])  GERD (gastroesophageal reflux disease)  (Resolved) Parkinson disease ([place])  REM behavioral disorder  Acute hypoxemic respiratory failure ([place])  Altered mental state  Severe sepsis ([place])   I have reviewed the medical record, interviewed the patient and family, and examined the patient. The following  aspects are pertinent.  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[place])    Essential hypertension, benign    GERD (gastroesophageal reflux disease)    Hyperlipidemia    Parkinson disease ([facility])    Squamous cell carcinoma of skin 04/22/2017   mid vertex scalp   Squamous cell carcinoma of skin 06/05/2019   crown scalp   Urinary incontinence    Social History   Socioeconomic History   Marital status: Widowed    Spouse name: Not on file   Number of children: 1   Years of education: Not on file   Highest education level: Not on file  Occupational History   Occupation: Clinical cytogeneticist    Comment: Disabled/retired  Tobacco Use   Smoking status: Former   Smokeless tobacco: Never   Tobacco comments:    during high Scientist, physiological Use: Never used  Substance and Sexual Activity   Alcohol use: Yes    Comment: whenever he gets the chance (once every few months)   Drug use: No    Comment: marijuana occ   Sexual activity: Never  Other Topics Concern   Not on file  Social History Narrative   Divorced then widowed  8 stepdaughter      Has living will   Niece [name] is health care POA   Has DNR   Would accept hospital   No extended tube feedings   Social Determinants of Health   Financial Resource Strain: Not on file  Food Insecurity: Not on file  Transportation Needs: Not on file  Physical Activity: Not on file  Stress: Not on file  Social Connections: Not on file   Family History  Problem Relation Age of Onset   Multiple sclerosis Mother        died @ 25   Stroke Father        died @ 63   Heart attack Father    Lung cancer Sister        died in her 29's   Scheduled Meds:  docusate sodium  100 mg Oral QID   enoxaparin (LOVENOX) injection  40 mg Subcutaneous Q24H   pantoprazole  40 mg Oral Daily    Continuous Infusions:  azithromycin 500 mg (05/22/21 2258)   cefTRIAXone (ROCEPHIN)  IV 2 g (05/23/21 0933)   PRN Meds:.acetaminophen **OR** acetaminophen, ondansetron **OR** ondansetron (ZOFRAN) IV, polyethylene glycol Allergies  Allergen Reactions   Lorazepam Other (See Comments)    confusion   Sulfa Antibiotics Hives   Review of Systems  Unable to perform ROS: Dementia   Physical Exam Constitutional:      General: He is not in acute distress. Pulmonary:     Effort: Pulmonary effort is normal.  Skin:    General: Skin is warm and dry.  Neurological:     Mental Status: He is alert.     Comments: Knows he is in the hospital with pneumonia, knows his niece is coming to visit tonight - easily confused    Vital Signs: BP 132/75 (BP Location: Left Arm)    Pulse 72    Temp 98.5 F (36.9 C)    Resp 18    Ht 5\' 9"  (1.753 m)    Wt 72 kg    SpO2 90%    BMI 23.44 kg/m  Pain Scale: PAINAD POSS *See Group Information*: 2-Acceptable,Slightly drowsy, easily aroused     SpO2: SpO2: 90 % O2 Device:SpO2: 90 % O2 Flow Rate: .O2 Flow Rate (L/min): 5 L/min  IO: Intake/output summary:  Intake/Output Summary (Last 24 hours) at 05/23/2021 1037 Last data filed at 05/23/2021 0507 Gross per 24 hour  Intake --  Output 500 ml  Net -500 ml    LBM:   Baseline Weight: Weight: 72 kg Most recent weight: Weight: 72 kg     Palliative Assessment/Data: PPS 40%    Juel Burrow, DNP, [facility] Palliative Medicine Team [phone] Pager: [phone]

## 2021-05-23 NOTE — Progress Notes (Signed)
[facility] Liaison Note  Notified by [facility] of patient/family request of [facility] Paliative services.  [facility] hospital liaison will follow patient for discharge disposition.   Please call with any questions/concerns.    Thank you for the opportunity to participate in this patient's care.   Daphene Calamity, MSW [facility] Liaison  [phone]

## 2021-05-23 NOTE — Progress Notes (Signed)
\ PROGRESS NOTE  Lucas Edwards  WYO:[id number] DOB: 1950-08-14 DOA: 05/21/2021 PCP: Venia Carbon, MD   Brief Narrative: Patient is a 71 year old male with history of dementia, GERD who was sent from [facility] facility for evaluation of shortness of breath, cough.  On presentation he was mildly febrile, otherwise he was hemodynamically stable.  COVID/influenza PCR negative.  Chest imaging showed patchy bilateral lower lobe opacities consistent with pneumonia.  Patient required high flow oxygen to maintain his saturation.  Patient was admitted for the management of sepsis, pneumonia.  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  CT chest did not show any PE but showed bilateral patchy opacities in the bases.  Severe hypoxia on presentation, requiring high flow oxygen.  Started on broad-spectrum antibiotics.  Follow-up cultures,NGTD.  Respiratory status is stabilizing, we are weaning oxygen  Sepsis: Presented with fever,hypoxia.  Afebrile today.  Follow-up cultures, no growth till date.  Continue current antibiotics  Pneumonia: Chest imagings as above.  Continue current antibiotics  Altered mental status: Secondary to metabolic encephalopathy from  hypoxia from pneumonia.  Monitor mental status.  CT head did not show any acute intracranial abnormalities  History of dementia: Advanced dementia.  Not oriented at baseline.  On carbidopa-levodopa, Zyprexa, Seroquel.   Continue delirium precautions. Ambulates with walker, currently residing in a facility. Mental status looks better today.  He is alert and awake and answers  questions.  Restarted Aricept, Lexapro, carbidopa-levodopa, Zyprexa.  Seroquel will be discontinued  Goals of care: Patient with advanced dementia presented with severe pneumonia, hypoxia.  Poor prognosis.  CODE STATUS is DNR.  Palliative care consulted for goals of care, recommend outpatient follow-up with hospice        DVT prophylaxis:enoxaparin (LOVENOX) injection 40 mg Start: 05/22/21 0800 Place TED hose Start: 05/21/21 2333     Code Status: DNR  Family Communication: Called and discussed with niece on phone on 2/24  Patient status: Inpatient  Patient is from : Nursing facility  Anticipated discharge to: Nursing facility  Estimated DC date: In 1 to 2 days  Consultants: Palliative care  Procedures: None  Antimicrobials:  Anti-infectives (From admission, onward)    Start     Dose/Rate Route Frequency Ordered Stop   05/23/21 0200  vancomycin (VANCOREADY) IVPB 1500 mg/300 mL  Status:  Discontinued        1,500 mg 150 mL/hr over 120 Minutes Intravenous Every 24 hours 05/22/21 0352 05/22/21 1140   05/22/21 1000  cefTRIAXone (ROCEPHIN) 2 g in sodium chloride 0.9 % 100 mL IVPB        2 g 200 mL/hr over 30 Minutes Intravenous Every 24 hours 05/21/21 2333 05/26/21 0959   05/21/21 2345  azithromycin (ZITHROMAX) 500 mg in sodium chloride 0.9 % 250 mL IVPB        500 mg 250 mL/hr over 60 Minutes Intravenous Every 24 hours 05/21/21 2333 05/26/21 2344   05/21/21 2230  ceFEPIme (MAXIPIME) 2 g in sodium chloride 0.9 % 100 mL IVPB        2 g 200 mL/hr over 30 Minutes Intravenous  Once 05/21/21 2219 05/21/21 2311   05/21/21 2230  metroNIDAZOLE (FLAGYL) IVPB 500 mg        500 mg 100 mL/hr over 60 Minutes Intravenous  Once 05/21/21 2219 05/22/21 0049   05/21/21 2230  vancomycin (VANCOCIN) IVPB 1000 mg/200 mL premix  Status:  Discontinued        1,000 mg 200 mL/hr over 60 Minutes Intravenous  Once 05/21/21 2219 05/21/21 2224   05/21/21 2230  vancomycin (VANCOREADY) IVPB 1750 mg/350  mL        1,750 mg 175 mL/hr over 120 Minutes Intravenous  Once 05/21/21 2224 05/22/21 0428       Subjective:  Patient seen and examined at the bedside this morning.  Looks much better today.  On 5 L of oxygen per minute.  Not in any kind of respiratory distress.  Alert, awake, obeys commands, communicates but not oriented to time and place  Objective: Vitals:   05/22/21 1937 05/22/21 2315 05/23/21 0408 05/23/21 0813  BP: 138/75 99/69 135/80 132/75  Pulse: 79 80 76 72  Resp: 18 18 16 18   Temp: 98.2 F (36.8 C) 99 F (37.2 C) 98.2 F (36.8 C) 98.5 F (36.9 C)  TempSrc:  Oral Oral   SpO2: 90% 92% 98% 92%  Weight:      Height:        Intake/Output Summary (Last 24 hours) at 05/23/2021 7353 Last data filed at 05/23/2021 0507 Gross per 24 hour  Intake --  Output 500 ml  Net -500 ml   Filed Weights   05/21/21 2210  Weight: 72 kg    Examination:  General exam: Overall comfortable, not in distress HEENT: PERRL Respiratory system: Diminished air sounds bilaterally, coarse breathing sounds, no wheezing or crackles Cardiovascular system: S1 & S2 heard, RRR.  Gastrointestinal system: Abdomen is nondistended, soft and nontender. Central nervous system: Alert and awake, oriented to self Extremities: No edema, no clubbing ,no cyanosis Skin: No rashes, no ulcers,no icterus    Data Reviewed: I have personally reviewed following labs and imaging studies  CBC: Recent Labs  Lab 05/21/21 2209 05/22/21 0735  WBC 10.1 7.3  NEUTROABS 8.7* 6.2  HGB 13.0 12.6*  HCT 39.9 38.8*  MCV 89.5 91.1  PLT 156 299*   Basic Metabolic Panel: Recent Labs  Lab 05/21/21 2209 05/22/21 0612 05/23/21 0514  NA 134* 138 141  K 4.0 4.4 4.6  CL 101 107 108  CO2 23 23 24   GLUCOSE 110* 111* 104*  BUN 21 17 14   CREATININE 1.17 1.17 0.85  CALCIUM 8.3* 7.6* 8.2*  MG  --  1.9  --   PHOS  --  3.2  --      Recent Results (from the past 240 hour(s))  Blood Culture (routine x 2)     Status:  None (Preliminary result)   Collection Time: 05/21/21 10:09 PM   Specimen: BLOOD  Result Value Ref Range Status   Specimen Description BLOOD LEFT WRIST  Final   Special Requests   Final    BOTTLES DRAWN AEROBIC AND ANAEROBIC Blood Culture adequate volume   Culture   Final    NO GROWTH 2 DAYS Performed at [facility], [address] [place], [place] [address]    Report Status PENDING  Incomplete  Blood Culture (routine x 2)     Status: None (Preliminary result)   Collection Time: 05/21/21 10:09 PM   Specimen: BLOOD  Result Value Ref Range Status   Specimen Description BLOOD LEFT ASSIST CONTROL  Final   Special Requests  Final    BOTTLES DRAWN AEROBIC AND ANAEROBIC Blood Culture adequate volume   Culture   Final    NO GROWTH 2 DAYS Performed at [facility], [address] [place], [place] [address]    Report Status PENDING  Incomplete  Resp Panel by RT-PCR (Flu A&B, Covid) Nasopharyngeal Swab     Status: None   Collection Time: 05/21/21 10:34 PM   Specimen: Nasopharyngeal Swab; Nasopharyngeal(NP) swabs in vial transport medium  Result Value Ref Range Status   SARS Coronavirus 2 by RT PCR NEGATIVE NEGATIVE Final    Comment: (NOTE) SARS-CoV-2 target nucleic acids are NOT DETECTED.  The SARS-CoV-2 RNA is generally detectable in upper respiratory specimens during the acute phase of infection. The lowest concentration of SARS-CoV-2 viral copies this assay can detect is 138 copies/mL. A negative result does not preclude SARS-Cov-2 infection and should not be used as the sole basis for treatment or other patient management decisions. A negative result may occur with  improper specimen collection/handling, submission of specimen other than nasopharyngeal swab, presence of viral mutation(s) within the areas targeted by this assay, and inadequate number of viral copies(<138 copies/mL). A negative result must be combined with clinical observations, patient  history, and epidemiological information. The expected result is Negative.  Fact Sheet for Patients:  EntrepreneurPulse.com.au  Fact Sheet for Healthcare Providers:  IncredibleEmployment.be  This test is no t yet approved or cleared by the [place] FDA and  has been authorized for detection and/or diagnosis of SARS-CoV-2 by FDA under an Emergency Use Authorization (EUA). This EUA will remain  in effect (meaning this test can be used) for the duration of the COVID-19 declaration under Section 564(b)(1) of the Act, 21 U.S.C.section 360bbb-3(b)(1), unless the authorization is terminated  or revoked sooner.       Influenza A by PCR NEGATIVE NEGATIVE Final   Influenza B by PCR NEGATIVE NEGATIVE Final    Comment: (NOTE) The Xpert Xpress SARS-CoV-2/FLU/RSV plus assay is intended as an aid in the diagnosis of influenza from Nasopharyngeal swab specimens and should not be used as a sole basis for treatment. Nasal washings and aspirates are unacceptable for Xpert Xpress SARS-CoV-2/FLU/RSV testing.  Fact Sheet for Patients: EntrepreneurPulse.com.au  Fact Sheet for Healthcare Providers: IncredibleEmployment.be  This test is not yet approved or cleared by the [place] FDA and has been authorized for detection and/or diagnosis of SARS-CoV-2 by FDA under an Emergency Use Authorization (EUA). This EUA will remain in effect (meaning this test can be used) for the duration of the COVID-19 declaration under Section 564(b)(1) of the Act, 21 U.S.C. section 360bbb-3(b)(1), unless the authorization is terminated or revoked.  Performed at [facility], [address] [place], [place] [address]   MRSA Next Gen by PCR, Nasal     Status: None   Collection Time: 05/22/21  7:59 AM   Specimen: Nasal Swab  Result Value Ref Range Status   MRSA by PCR Next Gen NOT DETECTED NOT DETECTED Final    Comment:  (NOTE) The GeneXpert MRSA Assay (FDA approved for NASAL specimens only), is one component of a comprehensive MRSA colonization surveillance program. It is not intended to diagnose MRSA infection nor to guide or monitor treatment for MRSA infections. Test performance is not FDA approved in patients less than 95 years old. Performed at [facility], [address] [place], [place] [address]      Radiology Studies: CT HEAD WO CONTRAST (5MM)  Result Date: 05/22/2021 CLINICAL DATA:  Altered mental status EXAM: CT HEAD WITHOUT CONTRAST TECHNIQUE: Contiguous axial images were obtained from the base of the skull through the vertex without intravenous contrast. RADIATION DOSE REDUCTION: This exam was performed according to the departmental dose-optimization program which includes automated exposure control, adjustment of the mA and/or kV according to patient size and/or use of iterative reconstruction technique. COMPARISON:  04/06/2018 FINDINGS: Brain: No evidence of acute infarction, hemorrhage, hydrocephalus, extra-axial collection or mass lesion/mass effect. Vascular: No hyperdense vessel or unexpected calcification. Skull: Normal. Negative for fracture or focal lesion. Sinuses/Orbits: No acute finding. Other: None. IMPRESSION: No acute intracranial abnormality noted. Electronically Signed   By: Inez Catalina M.D.   On: 05/22/2021 01:56   CT Angio Chest PE W and/or Wo Contrast  Result Date: 05/21/2021 CLINICAL DATA:  Pulmonary embolism (PE) suspected, high prob. Shortness of breath and cough. Per EMS pt started having cough for multiple days. Per EMS pt Sp02 reading low 80's on RA EXAM: CT ANGIOGRAPHY CHEST WITH CONTRAST TECHNIQUE: Multidetector CT imaging of the chest was performed using the standard protocol during bolus administration of intravenous contrast. Multiplanar CT image reconstructions and MIPs were obtained to evaluate the vascular anatomy. RADIATION DOSE REDUCTION: This exam was  performed according to the departmental dose-optimization program which includes automated exposure control, adjustment of the mA and/or kV according to patient size and/or use of iterative reconstruction technique. CONTRAST:  175mL OMNIPAQUE IOHEXOL 350 MG/ML SOLN COMPARISON:  None. FINDINGS: Cardiovascular: Satisfactory opacification of the pulmonary arteries to the segmental level. No evidence of pulmonary embolism. Normal heart size. No significant pericardial effusion. The thoracic aorta is normal in caliber. Mild atherosclerotic plaque of the thoracic aorta. No coronary artery calcifications. Mediastinum/Nodes: No enlarged mediastinal, hilar, or axillary lymph nodes. Thyroid gland, trachea, and esophagus demonstrate no significant findings. Lungs/Pleura: Patchy bilateral lower lobe airspace opacities that are more consolidative along the peribronchovascular regions. Similar findings to a lesser extent within the posterior bilateral upper lobes. Upper Abdomen: No acute abnormality. Musculoskeletal: No chest wall abnormality. No suspicious lytic or blastic osseous lesions. No acute displaced fracture. Multilevel mild degenerative changes of the spine. Review of the MIP images confirms the above findings. IMPRESSION: 1. No pulmonary embolus. 2. Patchy bilateral lower lobe airspace opacities that are more consolidative along the peribronchovascular regions. Similar findings to a lesser extent within the posterior bilateral upper lobes. Findings suggestive of infection/inflammation. Differential diagnosis includes alveolar hemorrhage. Consider 3 month CT follow-up to ensure resolution and exclude underlying malignancy. 3.  Aortic Atherosclerosis (ICD10-I70.0). Electronically Signed   By: Iven Finn M.D.   On: 05/21/2021 23:56   DG Chest Port 1 View  Result Date: 05/21/2021 CLINICAL DATA:  Questionable sepsis - evaluate for abnormality Cough and shortness of breath. EXAM: PORTABLE CHEST 1 VIEW COMPARISON:   Radiograph 04/23/2018 FINDINGS: There is patchy bibasilar airspace disease, right greater than left. The heart is normal in size. Stable mediastinal contours. No pulmonary edema, pleural effusion, or pneumothorax. No acute osseous abnormalities are seen. IMPRESSION: Patchy bibasilar airspace disease, right greater than left, atelectasis versus pneumonia. Electronically Signed   By: Keith Rake M.D.   On: 05/21/2021 22:31    Scheduled Meds:  docusate sodium  100 mg Oral QID   enoxaparin (LOVENOX) injection  40 mg Subcutaneous Q24H   pantoprazole  40 mg Oral Daily   Continuous Infusions:  azithromycin 500 mg (05/22/21 2258)   cefTRIAXone (ROCEPHIN)  IV Stopped (05/22/21 1005)     LOS: 1 day   Ivanell Deshotel,  MD [facility] P2/24/2023, 8:14 AM

## 2021-05-24 DIAGNOSIS — J189 Pneumonia, unspecified organism: Secondary | ICD-10-CM | POA: Diagnosis not present

## 2021-05-24 NOTE — Plan of Care (Signed)
  Problem: Health Behavior/Discharge Planning: Goal: Ability to manage health-related needs will improve Outcome: Progressing   Problem: Clinical Measurements: Goal: Ability to maintain clinical measurements within normal limits will improve Outcome: Progressing   Problem: Clinical Measurements: Goal: Will remain free from infection Outcome: Progressing   

## 2021-05-24 NOTE — Progress Notes (Signed)
\ PROGRESS NOTE  Lucas Edwards  GUY:[id number] DOB: June 26, 1950 DOA: 05/21/2021 PCP: Venia Carbon, MD   Brief Narrative: Patient is a 71 year old male with history of dementia, GERD who was sent from [place] facility for evaluation of shortness of breath, cough.  On presentation he was mildly febrile, otherwise he was hemodynamically stable.  COVID/influenza PCR negative.  Chest imaging showed patchy bilateral lower lobe opacities consistent with pneumonia.  Patient required high flow oxygen to maintain his saturation.  Patient was admitted for the management of sepsis, pneumonia.  Overall status is significantly improved.  Plan for discharge back to skilled nursing facility tomorrow  Assessment & Plan:  Principal Problem:   PNA (pneumonia) Active Problems:   REM behavioral disorder   Essential hypertension, benign   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HCC)   GERD (gastroesophageal reflux disease)   Acute hypoxemic respiratory failure (HCC)   Altered mental state   Severe sepsis (HCC)   Acute hypoxic respiratory failure: Presented with shortness of breath, cough.  CT chest did not show any PE but showed bilateral patchy opacities in the bases.  Severe hypoxia on presentation, requiring high flow oxygen.  Started on broad-spectrum antibiotics.  Follow-up cultures,NGTD.  Respiratory status is stabilizing, we are weaning oxygen,on 2 L today  Sepsis: Presented with fever,hypoxia.  Afebrile today.  Follow-up cultures, no growth till date.  Continue current antibiotics  Pneumonia: Chest imagings as above.  Continue current antibiotics  Dysphagia: On dysphagia 3 diet.  Speech therapy was following.  Altered mental status: Secondary to metabolic encephalopathy from  hypoxia from pneumonia.  Monitor mental status.  CT head did not show any acute intracranial abnormalities .currently at baseline  History of dementia: Advanced dementia.  Not oriented at baseline.  On  carbidopa-levodopa, Zyprexa, Seroquel.   Continue delirium precautions. Ambulates with walker, currently residing in a facility. Mental status looks better today.  He is alert and awake and answers questions.  Restarted Aricept, Lexapro, carbidopa-levodopa, Zyprexa.  Seroquel will be discontinued  Goals of care: Patient with advanced dementia presented with severe pneumonia, hypoxia.  Poor prognosis.  CODE STATUS is DNR.  Palliative care consulted for goals of care, recommend outpatient follow-up with hospice        DVT prophylaxis:enoxaparin (LOVENOX) injection 40 mg Start: 05/22/21 0800 Place TED hose Start: 05/21/21 2333     Code Status: DNR  Family Communication: Discussed with niece at bedside today  Patient status: Inpatient  Patient is from : Nursing facility  Anticipated discharge to: Nursing facility  Estimated DC date: Tomorrow  Consultants: Palliative care  Procedures: None  Antimicrobials:  Anti-infectives (From admission, onward)    Start     Dose/Rate Route Frequency Ordered Stop   05/23/21 0200  vancomycin (VANCOREADY) IVPB 1500 mg/300 mL  Status:  Discontinued        1,500 mg 150 mL/hr over 120 Minutes Intravenous Every 24 hours 05/22/21 0352 05/22/21 1140   05/22/21 1000  cefTRIAXone (ROCEPHIN) 2 g in sodium chloride 0.9 % 100 mL IVPB        2 g 200 mL/hr over 30 Minutes Intravenous Every 24 hours 05/21/21 2333 05/26/21 0959   05/21/21 2345  azithromycin (ZITHROMAX) 500 mg in sodium chloride 0.9 % 250 mL IVPB        500 mg 250 mL/hr over 60 Minutes Intravenous Every 24 hours 05/21/21 2333 05/26/21 2344   05/21/21 2230  ceFEPIme (MAXIPIME) 2 g in sodium chloride 0.9 % 100 mL IVPB  2 g 200 mL/hr over 30 Minutes Intravenous  Once 05/21/21 2219 05/21/21 2311   05/21/21 2230  metroNIDAZOLE (FLAGYL) IVPB 500 mg        500 mg 100 mL/hr over 60 Minutes Intravenous  Once 05/21/21 2219 05/22/21 0049   05/21/21 2230  vancomycin (VANCOCIN) IVPB 1000 mg/200  mL premix  Status:  Discontinued        1,000 mg 200 mL/hr over 60 Minutes Intravenous  Once 05/21/21 2219 05/21/21 2224   05/21/21 2230  vancomycin (VANCOREADY) IVPB 1750 mg/350 mL        1,750 mg 175 mL/hr over 120 Minutes Intravenous  Once 05/21/21 2224 05/22/21 0428       Subjective:  Patient seen and examined at bedside this morning.  Hemodynamically stable.  Comfortable.  Eating his breakfast.  On 2 L of oxygen per minute.  Not in any kind of distress  Objective: Vitals:   05/24/21 0401 05/24/21 0403 05/24/21 0500 05/24/21 0739  BP: (!) 150/97 (!) 151/91  127/83  Pulse: 75 72  77  Resp: (!) 22   18  Temp: 97.8 F (36.6 C)   97.9 F (36.6 C)  TempSrc: Oral     SpO2: 96% 95%  93%  Weight:   74.8 kg   Height:        Intake/Output Summary (Last 24 hours) at 05/24/2021 5797 Last data filed at 05/24/2021 0402 Gross per 24 hour  Intake 1297.07 ml  Output 850 ml  Net 447.07 ml   Filed Weights   05/21/21 2210 05/24/21 0500  Weight: 72 kg 74.8 kg    Examination:  General exam: Overall comfortable, not in distress HEENT: PERRL Respiratory system: Bilateral rhonchi, crackles, diminished air sounds Cardiovascular system: S1 & S2 heard, RRR.  Gastrointestinal system: Abdomen is nondistended, soft and nontender. Central nervous system: Alert and awake, self oriented Extremities: No edema, no clubbing ,no cyanosis Skin: No rashes, no ulcers,no icterus    Data Reviewed: I have personally reviewed following labs and imaging studies  CBC: Recent Labs  Lab 05/21/21 2209 05/22/21 0735  WBC 10.1 7.3  NEUTROABS 8.7* 6.2  HGB 13.0 12.6*  HCT 39.9 38.8*  MCV 89.5 91.1  PLT 156 282*   Basic Metabolic Panel: Recent Labs  Lab 05/21/21 2209 05/22/21 0612 05/23/21 0514  NA 134* 138 141  K 4.0 4.4 4.6  CL 101 107 108  CO2 23 23 24   GLUCOSE 110* 111* 104*  BUN 21 17 14   CREATININE 1.17 1.17 0.85  CALCIUM 8.3* 7.6* 8.2*  MG  --  1.9  --   PHOS  --  3.2  --       Recent Results (from the past 240 hour(s))  Blood Culture (routine x 2)     Status: None (Preliminary result)   Collection Time: 05/21/21 10:09 PM   Specimen: BLOOD  Result Value Ref Range Status   Specimen Description BLOOD LEFT WRIST  Final   Special Requests   Final    BOTTLES DRAWN AEROBIC AND ANAEROBIC Blood Culture adequate volume   Culture   Final    NO GROWTH 2 DAYS Performed at [facility], [address] [place], [place] [address]    Report Status PENDING  Incomplete  Blood Culture (routine x 2)     Status: None (Preliminary result)   Collection Time: 05/21/21 10:09 PM   Specimen: BLOOD  Result Value Ref Range Status   Specimen Description BLOOD LEFT ASSIST CONTROL  Final  Special Requests   Final    BOTTLES DRAWN AEROBIC AND ANAEROBIC Blood Culture adequate volume   Culture   Final    NO GROWTH 2 DAYS Performed at [facility], [address] [place], [place] [address]    Report Status PENDING  Incomplete  Resp Panel by RT-PCR (Flu A&B, Covid) Nasopharyngeal Swab     Status: None   Collection Time: 05/21/21 10:34 PM   Specimen: Nasopharyngeal Swab; Nasopharyngeal(NP) swabs in vial transport medium  Result Value Ref Range Status   SARS Coronavirus 2 by RT PCR NEGATIVE NEGATIVE Final    Comment: (NOTE) SARS-CoV-2 target nucleic acids are NOT DETECTED.  The SARS-CoV-2 RNA is generally detectable in upper respiratory specimens during the acute phase of infection. The lowest concentration of SARS-CoV-2 viral copies this assay can detect is 138 copies/mL. A negative result does not preclude SARS-Cov-2 infection and should not be used as the sole basis for treatment or other patient management decisions. A negative result may occur with  improper specimen collection/handling, submission of specimen other than nasopharyngeal swab, presence of viral mutation(s) within the areas targeted by this assay, and inadequate number of  viral copies(<138 copies/mL). A negative result must be combined with clinical observations, patient history, and epidemiological information. The expected result is Negative.  Fact Sheet for Patients:  EntrepreneurPulse.com.au  Fact Sheet for Healthcare Providers:  IncredibleEmployment.be  This test is no t yet approved or cleared by the [place] FDA and  has been authorized for detection and/or diagnosis of SARS-CoV-2 by FDA under an Emergency Use Authorization (EUA). This EUA will remain  in effect (meaning this test can be used) for the duration of the COVID-19 declaration under Section 564(b)(1) of the Act, 21 U.S.C.section 360bbb-3(b)(1), unless the authorization is terminated  or revoked sooner.       Influenza A by PCR NEGATIVE NEGATIVE Final   Influenza B by PCR NEGATIVE NEGATIVE Final    Comment: (NOTE) The Xpert Xpress SARS-CoV-2/FLU/RSV plus assay is intended as an aid in the diagnosis of influenza from Nasopharyngeal swab specimens and should not be used as a sole basis for treatment. Nasal washings and aspirates are unacceptable for Xpert Xpress SARS-CoV-2/FLU/RSV testing.  Fact Sheet for Patients: EntrepreneurPulse.com.au  Fact Sheet for Healthcare Providers: IncredibleEmployment.be  This test is not yet approved or cleared by the [place] FDA and has been authorized for detection and/or diagnosis of SARS-CoV-2 by FDA under an Emergency Use Authorization (EUA). This EUA will remain in effect (meaning this test can be used) for the duration of the COVID-19 declaration under Section 564(b)(1) of the Act, 21 U.S.C. section 360bbb-3(b)(1), unless the authorization is terminated or revoked.  Performed at [facility], [address], [place], [place] [address]   Urine Culture     Status: None   Collection Time: 05/22/21  7:59 AM   Specimen: In/Out Cath Urine  Result  Value Ref Range Status   Specimen Description   Final    IN/OUT CATH URINE Performed at [facility], [address], [place], [place] [address]    Special Requests   Final    NONE Performed at [facility], [address], [place], [place] [address]    Culture   Final    NO GROWTH Performed at [facility], [place] [address] [place], [place] [address]    Report Status 05/23/2021 FINAL  Final  MRSA Next Gen by PCR, Nasal     Status: None  Collection Time: 05/22/21  7:59 AM   Specimen: Nasal Swab  Result Value Ref Range Status   MRSA by PCR Next Gen NOT DETECTED NOT DETECTED Final    Comment: (NOTE) The GeneXpert MRSA Assay (FDA approved for NASAL specimens only), is one component of a comprehensive MRSA colonization surveillance program. It is not intended to diagnose MRSA infection nor to guide or monitor treatment for MRSA infections. Test performance is not FDA approved in patients less than 70 years old. Performed at [facility], [address], [place], [place] [address]      Radiology Studies: No results found.  Scheduled Meds:  carbidopa-levodopa  3 tablet Oral 8 times per day on Mon Tue Wed Thu Fri Sat   carbidopa-levodopa  4 tablet Oral QHS   docusate sodium  100 mg Oral BID   donepezil  10 mg Oral QPM   enoxaparin (LOVENOX) injection  40 mg Subcutaneous Q24H   escitalopram  10 mg Oral Daily   OLANZapine  5 mg Oral QHS   pantoprazole  40 mg Oral Daily   Continuous Infusions:  azithromycin 500 mg (05/23/21 2341)   cefTRIAXone (ROCEPHIN)  IV 2 g (05/23/21 0933)     LOS: 2 days   Shelly Coss, MD [facility] P2/25/2023, 8:22 AM

## 2021-05-24 NOTE — Plan of Care (Signed)
  Problem: Education: Goal: Knowledge of General Education information will improve Description Including pain rating scale, medication(s)/side effects and non-pharmacologic comfort measures Outcome: Progressing   Problem: Health Behavior/Discharge Planning: Goal: Ability to manage health-related needs will improve Outcome: Progressing   

## 2021-05-24 NOTE — TOC Progression Note (Signed)
Transition of Care [facility]) - Progression Note    Patient Details  Name: Lucas Edwards MRN: [id number] Date of Birth: [birth year]  Transition of Care [facility]) CM/SW [place], LCSW Phone Number: 05/24/2021, 11:59 AM  Clinical Narrative:   Notified by MD that patient will be ready to DC to SNF tomorrow. Reached out to Seth Bake at [facility] who confirmed they can take patient back tomorrow if medically ready.     Expected Discharge Plan: [place] Barriers to Discharge: Continued Medical Work up  Expected Discharge Plan and Services Expected Discharge Plan: [place]   Discharge Planning Services: CM Consult Post Acute Care Choice: [place], Resumption of Svcs/PTA Provider Living arrangements for the past 2 months: [place]                 DME Arranged: N/A DME Agency: NA       HH Arranged: NA HH Agency: NA         Social Determinants of Health (SDOH) Interventions    Readmission Risk Interventions No flowsheet data found.

## 2021-05-25 DIAGNOSIS — J189 Pneumonia, unspecified organism: Secondary | ICD-10-CM | POA: Diagnosis not present

## 2021-05-25 MED ORDER — AMOXICILLIN-POT CLAVULANATE 875-125 MG PO TABS: 1.0000 | ORAL_TABLET | Freq: Two times a day (BID) | ORAL | 0 refills | Status: AC

## 2021-05-25 MED ORDER — GUAIFENESIN-DM 100-10 MG/5ML PO SYRP: 5.0000 mL | ORAL_SOLUTION | ORAL | 0 refills | Status: DC | PRN

## 2021-05-25 MED ORDER — GUAIFENESIN-DM 100-10 MG/5ML PO SYRP
5.0000 mL | ORAL_SOLUTION | ORAL | Status: DC | PRN
  Administered 2021-05-25: 5 mL via ORAL
  Filled 2021-05-25: qty 5

## 2021-05-25 NOTE — Progress Notes (Signed)
Discharge instructions, RX's and follow up appts printed and sent to [facility]. Report called to SNF spoke to [facility] nurse. Family aware of discharge plan.  Antara Brecheisen, Tivis Ringer, RN

## 2021-05-25 NOTE — TOC Transition Note (Signed)
Transition of Care [facility]) - CM/SW Discharge Note   Patient Details  Name: Lucas Edwards MRN: [id number] Date of Birth: 01-24-1951  [facility] CM/SW Contact:  Magnus Ivan, LCSW Phone Number: 05/25/2021, 1:24 PM   Clinical Narrative:   Discharge to [facility] today. Room 419. Confirmed with Admissions Worker Seth Bake Updated MD, RN, and left VM for niece. RN has also been in contact with niece.  Asked RN to call report. EMS paperwork completed. [facility] arranged. Patient is 2nd on the list. RN notified.    Final next level of care: [place] Barriers to Discharge: Barriers Resolved   Patient Goals and CMS Choice Patient states their goals for this hospitalization and ongoing recovery are:: Hopefully patient will get better and be able to go back to [facility]- open to palliative care- patient is a DNR CMS Medicare.gov Compare Post Acute Care list provided to:: Patient Represenative (must comment) Choice offered to / list presented to : [facility] POA / Guardian (niece- [name])  Discharge Placement              Patient chooses bed at: [facility] Patient to be transferred to facility by: [facility] Name of family member notified: [name] (niece) left VM Patient and family notified of of transfer: 05/25/21  Discharge Plan and Services   Discharge Planning Services: CM Consult Post Acute Care Choice: [place], Resumption of Svcs/PTA Provider          DME Arranged: N/A DME Agency: NA       HH Arranged: NA HH Agency: NA        Social Determinants of Health (SDOH) Interventions     Readmission Risk Interventions No flowsheet data found.

## 2021-05-25 NOTE — Plan of Care (Signed)
°  Problem: Education: Goal: Knowledge of General Education information will improve Description: Including pain rating scale, medication(s)/side effects and non-pharmacologic comfort measures 05/25/2021 1131 by Emmaline Life, RN Outcome: Adequate for Discharge 05/25/2021 1131 by Emmaline Life, RN Outcome: Adequate for Discharge   Problem: Health Behavior/Discharge Planning: Goal: Ability to manage health-related needs will improve 05/25/2021 1131 by Emmaline Life, RN Outcome: Adequate for Discharge 05/25/2021 1131 by Emmaline Life, RN Outcome: Adequate for Discharge   Problem: Clinical Measurements: Goal: Ability to maintain clinical measurements within normal limits will improve 05/25/2021 1131 by Emmaline Life, RN Outcome: Adequate for Discharge 05/25/2021 1131 by Emmaline Life, RN Outcome: Adequate for Discharge Goal: Will remain free from infection 05/25/2021 1131 by Emmaline Life, RN Outcome: Adequate for Discharge 05/25/2021 1131 by Emmaline Life, RN Outcome: Adequate for Discharge Goal: Diagnostic test results will improve 05/25/2021 1131 by Emmaline Life, RN Outcome: Adequate for Discharge 05/25/2021 1131 by Emmaline Life, RN Outcome: Adequate for Discharge Goal: Respiratory complications will improve 05/25/2021 1131 by Emmaline Life, RN Outcome: Adequate for Discharge 05/25/2021 1131 by Emmaline Life, RN Outcome: Adequate for Discharge Goal: Cardiovascular complication will be avoided 05/25/2021 1131 by Emmaline Life, RN Outcome: Adequate for Discharge 05/25/2021 1131 by Emmaline Life, RN Outcome: Adequate for Discharge   Problem: Activity: Goal: Risk for activity intolerance will decrease 05/25/2021 1131 by Emmaline Life, RN Outcome: Adequate for Discharge 05/25/2021 1131 by Emmaline Life, RN Outcome: Adequate for Discharge   Problem: Nutrition: Goal: Adequate nutrition will be maintained 05/25/2021 1131 by  Emmaline Life, RN Outcome: Adequate for Discharge 05/25/2021 1131 by Emmaline Life, RN Outcome: Adequate for Discharge   Problem: Coping: Goal: Level of anxiety will decrease 05/25/2021 1131 by Emmaline Life, RN Outcome: Adequate for Discharge 05/25/2021 1131 by Emmaline Life, RN Outcome: Adequate for Discharge   Problem: Elimination: Goal: Will not experience complications related to bowel motility 05/25/2021 1131 by Emmaline Life, RN Outcome: Adequate for Discharge 05/25/2021 1131 by Emmaline Life, RN Outcome: Adequate for Discharge Goal: Will not experience complications related to urinary retention 05/25/2021 1131 by Emmaline Life, RN Outcome: Adequate for Discharge 05/25/2021 1131 by Emmaline Life, RN Outcome: Adequate for Discharge   Problem: Pain Managment: Goal: General experience of comfort will improve 05/25/2021 1131 by Emmaline Life, RN Outcome: Adequate for Discharge 05/25/2021 1131 by Emmaline Life, RN Outcome: Adequate for Discharge   Problem: Safety: Goal: Ability to remain free from injury will improve 05/25/2021 1131 by Emmaline Life, RN Outcome: Adequate for Discharge 05/25/2021 1131 by Emmaline Life, RN Outcome: Adequate for Discharge   Problem: Skin Integrity: Goal: Risk for impaired skin integrity will decrease 05/25/2021 1131 by Emmaline Life, RN Outcome: Adequate for Discharge 05/25/2021 1131 by Emmaline Life, RN Outcome: Adequate for Discharge   Problem: Skin Integrity: Goal: Risk for impaired skin integrity will decrease 05/25/2021 1131 by Emmaline Life, RN Outcome: Adequate for Discharge 05/25/2021 1131 by Emmaline Life, RN Outcome: Adequate for Discharge

## 2021-05-25 NOTE — Discharge Summary (Signed)
Physician Discharge Summary  Lucas Edwards QVZ:[id number] DOB: September 09, 1950 DOA: 05/21/2021  PCP: Venia Carbon, MD  Admit date: 05/21/2021 Discharge date: 05/25/2021  Admitted From: Home Disposition:  Home  Discharge Condition:Stable CODE STATUS:DNR Diet recommendation: Dysphagia 3 diet  Brief/Interim Summary:  Patient is a 71 year old male with history of dementia, GERD who was sent from [place] facility for evaluation of shortness of breath, cough.  On presentation he was mildly febrile, otherwise he was hemodynamically stable.  COVID/influenza PCR negative.  Chest imaging showed patchy bilateral lower lobe opacities consistent with pneumonia.  Patient required high flow oxygen to maintain his saturation.  Patient was admitted for the management of sepsis, pneumonia.  Overall status is significantly improved, currently on room air.  Hemodynamically stable for discharge back to his nursing facility.  Following problems were addressed during his hospitalization:  Acute hypoxic respiratory failure: Presented with shortness of breath, cough.  CT chest did not show any PE but showed bilateral patchy opacities in the bases.  Severe hypoxia on presentation, requiring high flow oxygen.  Started on broad-spectrum antibiotics.  Follow-up cultures,NGTD.  Respiratory status stabilized, now weaned to room air   Sepsis: Presented with fever,hypoxia.  Afebrile today.  Follow-up cultures, no growth till date.  Continue oral antibiotics   Pneumonia: Chest imagings as above.  Continue current antibiotics   Dysphagia: On dysphagia 3 diet.  Speech therapy was following.   Altered mental status: Secondary to metabolic encephalopathy from  hypoxia from pneumonia.  Monitor mental status.  CT head did not show any acute intracranial abnormalities .currently at baseline   History of dementia: Advanced dementia.  Not oriented at baseline.  On carbidopa-levodopa, Zyprexa, Seroquel.   Continue  delirium precautions. Ambulates with walker, currently residing in a facility. Mental status looks better today.  He is alert and awake and answers questions.  Restarted Aricept, Lexapro, carbidopa-levodopa, Zyprexa.  Seroquel will be discontinued due to duplicity with Zyprexa   Goals of care: Patient with advanced dementia presented with severe pneumonia, hypoxia.  CODE STATUS is DNR.  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  Do a CBC, BMP test in a week.   Increase activity slowly   Complete by: As directed       Allergies as of 05/25/2021       Reactions   Lorazepam Other (See Comments)   confusion   Sulfa Antibiotics Hives        Medication List     STOP taking these medications    azithromycin 250 MG tablet Commonly known as: ZITHROMAX   entacapone 200 MG tablet Commonly known as: COMTAN   Polyethyl Glycol-Propyl Glycol 0.4-0.3 % Soln   QUEtiapine 25 MG tablet Commonly known as: SEROQUEL       TAKE these medications    amoxicillin 500 MG capsule Commonly known  as: AMOXIL Take 2,000 mg by mouth as directed. Before Dental Procedures   amoxicillin-clavulanate 875-125 MG tablet Commonly known as: Augmentin Take 1 tablet by mouth 2 (two) times daily for 3 days. Start taking on: May 26, 2021 What changed: additional  instructions   carbidopa-levodopa 25-100 MG tablet Commonly known as: SINEMET IR Take 3-4 tablets by mouth every 2 (two) hours. 4 AM, 6 AM, 8 AM, 10 AM, NOON, 2 PM, 4 PM, 6 PM, (8 PM 3 TABLETS)   docusate sodium 100 MG capsule Commonly known as: COLACE Take 200 mg by mouth at bedtime.   donepezil 10 MG tablet Commonly known as: ARICEPT Take 10 mg by mouth every evening.   escitalopram 10 MG tablet Commonly known as: LEXAPRO Take 10 mg by mouth daily.   guaiFENesin-dextromethorphan 100-10 MG/5ML syrup Commonly known as: ROBITUSSIN DM Take 5 mLs by mouth every 4 (four) hours as needed for cough.   hydrocortisone 2.5 % cream Apply 1 application topically daily at 12 noon.   Lidocaine 4 % Ptch Apply 1 patch topically daily as needed for pain.   meloxicam 15 MG tablet Commonly known as: MOBIC Take 15 mg by mouth daily as needed for pain.   OLANZapine 5 MG tablet Commonly known as: ZYPREXA Take 5 mg by mouth at bedtime.   pantoprazole 40 MG tablet Commonly known as: PROTONIX Take 40 mg by mouth 2 (two) times daily.   polyethylene glycol powder 17 GM/SCOOP powder Commonly known as: GLYCOLAX/MIRALAX Take 17 g by mouth daily as needed for constipation.   senna-docusate 8.6-50 MG tablet Commonly known as: Senokot-S Take 1 tablet by mouth 2 (two) times daily.   tiZANidine 2 MG tablet Commonly known as: ZANAFLEX Take 2 mg by mouth every 8 (eight) hours as needed for muscle spasms.   traMADol 50 MG tablet Commonly known as: ULTRAM Take 50 mg by mouth 2 (two) times daily as needed for pain.        Follow-up Information     Venia Carbon, MD. Schedule an appointment as soon as possible for a visit in 1 week(s).   Specialties: Internal Medicine, Pediatrics Contact information: [place] [address] [phone]                Allergies  Allergen Reactions   Lorazepam Other (See Comments)    confusion   Sulfa Antibiotics Hives     Consultations: Palliative care   Procedures/Studies: CT HEAD WO CONTRAST (5MM)  Result Date: 05/22/2021 CLINICAL DATA:  Altered mental status EXAM: CT HEAD WITHOUT CONTRAST TECHNIQUE: Contiguous axial images were obtained from the base of the skull through the vertex without intravenous contrast. RADIATION DOSE REDUCTION: This exam was performed according to the departmental dose-optimization program which includes automated exposure control, adjustment of the mA and/or kV according to patient size and/or use of iterative reconstruction technique. COMPARISON:  04/06/2018 FINDINGS: Brain: No evidence of acute infarction, hemorrhage, hydrocephalus, extra-axial collection or mass lesion/mass effect. Vascular: No hyperdense vessel or unexpected calcification. Skull: Normal. Negative for fracture or focal lesion. Sinuses/Orbits: No acute finding. Other: None. IMPRESSION: No acute intracranial abnormality noted. Electronically Signed   By: Inez Catalina M.D.   On: 05/22/2021 01:56   CT Angio Chest PE W and/or Wo Contrast  Result Date: 05/21/2021 CLINICAL DATA:  Pulmonary embolism (PE) suspected, high prob. Shortness of breath and cough. Per EMS pt started having cough for multiple days. Per EMS pt Sp02 reading low 80's on RA  EXAM: CT ANGIOGRAPHY CHEST WITH CONTRAST TECHNIQUE: Multidetector CT imaging of the chest was performed using the standard protocol during bolus administration of intravenous contrast. Multiplanar CT image reconstructions and MIPs were obtained to evaluate the vascular anatomy. RADIATION DOSE REDUCTION: This exam was performed according to the departmental dose-optimization program which includes automated exposure control, adjustment of the mA and/or kV according to patient size and/or use of iterative reconstruction technique. CONTRAST:  152mL OMNIPAQUE IOHEXOL 350 MG/ML SOLN COMPARISON:  None. FINDINGS: Cardiovascular: Satisfactory opacification of the pulmonary arteries to the  segmental level. No evidence of pulmonary embolism. Normal heart size. No significant pericardial effusion. The thoracic aorta is normal in caliber. Mild atherosclerotic plaque of the thoracic aorta. No coronary artery calcifications. Mediastinum/Nodes: No enlarged mediastinal, hilar, or axillary lymph nodes. Thyroid gland, trachea, and esophagus demonstrate no significant findings. Lungs/Pleura: Patchy bilateral lower lobe airspace opacities that are more consolidative along the peribronchovascular regions. Similar findings to a lesser extent within the posterior bilateral upper lobes. Upper Abdomen: No acute abnormality. Musculoskeletal: No chest wall abnormality. No suspicious lytic or blastic osseous lesions. No acute displaced fracture. Multilevel mild degenerative changes of the spine. Review of the MIP images confirms the above findings. IMPRESSION: 1. No pulmonary embolus. 2. Patchy bilateral lower lobe airspace opacities that are more consolidative along the peribronchovascular regions. Similar findings to a lesser extent within the posterior bilateral upper lobes. Findings suggestive of infection/inflammation. Differential diagnosis includes alveolar hemorrhage. Consider 3 month CT follow-up to ensure resolution and exclude underlying malignancy. 3.  Aortic Atherosclerosis (ICD10-I70.0). Electronically Signed   By: Iven Finn M.D.   On: 05/21/2021 23:56   DG Chest Port 1 View  Result Date: 05/21/2021 CLINICAL DATA:  Questionable sepsis - evaluate for abnormality Cough and shortness of breath. EXAM: PORTABLE CHEST 1 VIEW COMPARISON:  Radiograph 04/23/2018 FINDINGS: There is patchy bibasilar airspace disease, right greater than left. The heart is normal in size. Stable mediastinal contours. No pulmonary edema, pleural effusion, or pneumothorax. No acute osseous abnormalities are seen. IMPRESSION: Patchy bibasilar airspace disease, right greater than left, atelectasis versus pneumonia. Electronically  Signed   By: Keith Rake M.D.   On: 05/21/2021 22:31      Subjective: Patient seen and examined at bedside this morning.  Hemodynamically stable for discharge today.  Discharge Exam: Vitals:   05/25/21 0542 05/25/21 0821  BP: (!) 148/87 (!) 139/99  Pulse: 71 66  Resp: 16 18  Temp: 98.2 F (36.8 C) 98.7 F (37.1 C)  SpO2: 94% (!) 89%   Vitals:   05/24/21 2000 05/25/21 0046 05/25/21 0542 05/25/21 0821  BP: 125/88 127/87 (!) 148/87 (!) 139/99  Pulse: 84 79 71 66  Resp: 20 18 16 18   Temp: 98.3 F (36.8 C) 98.1 F (36.7 C) 98.2 F (36.8 C) 98.7 F (37.1 C)  TempSrc: Oral Oral Oral   SpO2: 93% 96% 94% (!) 89%  Weight:      Height:        General: Pt is sleepy but not in acute distress Cardiovascular: RRR, S1/S2 +, no rubs, no gallops Respiratory: CTA bilaterally, no wheezing, no rhonchi Abdominal: Soft, NT, ND, bowel sounds + Extremities: no edema, no cyanosis    The results of significant diagnostics from this hospitalization (including imaging, microbiology, ancillary and laboratory) are listed below for reference.     Microbiology: Recent Results (from the past 240 hour(s))  Blood Culture (routine x 2)     Status: None (Preliminary result)   Collection Time:  05/21/21 10:09 PM   Specimen: BLOOD  Result Value Ref Range Status   Specimen Description BLOOD LEFT WRIST  Final   Special Requests   Final    BOTTLES DRAWN AEROBIC AND ANAEROBIC Blood Culture adequate volume   Culture   Final    NO GROWTH 4 DAYS Performed at [facility], [address] [place], [place] [address]    Report Status PENDING  Incomplete  Blood Culture (routine x 2)     Status: None (Preliminary result)   Collection Time: 05/21/21 10:09 PM   Specimen: BLOOD  Result Value Ref Range Status   Specimen Description BLOOD LEFT ASSIST CONTROL  Final   Special Requests   Final    BOTTLES DRAWN AEROBIC AND ANAEROBIC Blood Culture adequate volume   Culture   Final    NO GROWTH 4  DAYS Performed at [facility], [address], [place], [place] [address]    Report Status PENDING  Incomplete  Resp Panel by RT-PCR (Flu A&B, Covid) Nasopharyngeal Swab     Status: None   Collection Time: 05/21/21 10:34 PM   Specimen: Nasopharyngeal Swab; Nasopharyngeal(NP) swabs in vial transport medium  Result Value Ref Range Status   SARS Coronavirus 2 by RT PCR NEGATIVE NEGATIVE Final    Comment: (NOTE) SARS-CoV-2 target nucleic acids are NOT DETECTED.  The SARS-CoV-2 RNA is generally detectable in upper respiratory specimens during the acute phase of infection. The lowest concentration of SARS-CoV-2 viral copies this assay can detect is 138 copies/mL. A negative result does not preclude SARS-Cov-2 infection and should not be used as the sole basis for treatment or other patient management decisions. A negative result may occur with  improper specimen collection/handling, submission of specimen other than nasopharyngeal swab, presence of viral mutation(s) within the areas targeted by this assay, and inadequate number of viral copies(<138 copies/mL). A negative result must be combined with clinical observations, patient history, and epidemiological information. The expected result is Negative.  Fact Sheet for Patients:  EntrepreneurPulse.com.au  Fact Sheet for Healthcare Providers:  IncredibleEmployment.be  This test is no t yet approved or cleared by the [place] FDA and  has been authorized for detection and/or diagnosis of SARS-CoV-2 by FDA under an Emergency Use Authorization (EUA). This EUA will remain  in effect (meaning this test can be used) for the duration of the COVID-19 declaration under Section 564(b)(1) of the Act, 21 U.S.C.section 360bbb-3(b)(1), unless the authorization is terminated  or revoked sooner.       Influenza A by PCR NEGATIVE NEGATIVE Final   Influenza B by PCR NEGATIVE NEGATIVE Final     Comment: (NOTE) The Xpert Xpress SARS-CoV-2/FLU/RSV plus assay is intended as an aid in the diagnosis of influenza from Nasopharyngeal swab specimens and should not be used as a sole basis for treatment. Nasal washings and aspirates are unacceptable for Xpert Xpress SARS-CoV-2/FLU/RSV testing.  Fact Sheet for Patients: EntrepreneurPulse.com.au  Fact Sheet for Healthcare Providers: IncredibleEmployment.be  This test is not yet approved or cleared by the [place] FDA and has been authorized for detection and/or diagnosis of SARS-CoV-2 by FDA under an Emergency Use Authorization (EUA). This EUA will remain in effect (meaning this test can be used) for the duration of the COVID-19 declaration under Section 564(b)(1) of the Act, 21 U.S.C. section 360bbb-3(b)(1), unless the authorization is terminated or revoked.  Performed at [facility], [address], [place], [place] [address]   Urine Culture     Status:  None   Collection Time: 05/22/21  7:59 AM   Specimen: In/Out Cath Urine  Result Value Ref Range Status   Specimen Description   Final    IN/OUT CATH URINE Performed at [facility], [address], [place], [place] [address]    Special Requests   Final    NONE Performed at [facility], [address] [place], [place] [address]    Culture   Final    NO GROWTH Performed at [facility], [place] [address] [place], [place] [address]    Report Status 05/23/2021 FINAL  Final  MRSA Next Gen by PCR, Nasal     Status: None   Collection Time: 05/22/21  7:59 AM   Specimen: Nasal Swab  Result Value Ref Range Status   MRSA by PCR Next Gen NOT DETECTED NOT DETECTED Final    Comment: (NOTE) The GeneXpert MRSA Assay (FDA approved for NASAL specimens only), is one component of a comprehensive MRSA colonization surveillance program. It is not intended to diagnose MRSA infection nor to guide or monitor  treatment for MRSA infections. Test performance is not FDA approved in patients less than 55 years old. Performed at [facility], [address] [place], [place] [address]      Labs: BNP (last 3 results) No results for input(s): BNP in the last 8760 hours. Basic Metabolic Panel: Recent Labs  Lab 05/21/21 2209 05/22/21 0612 05/23/21 0514  NA 134* 138 141  K 4.0 4.4 4.6  CL 101 107 108  CO2 23 23 24   GLUCOSE 110* 111* 104*  BUN 21 17 14   CREATININE 1.17 1.17 0.85  CALCIUM 8.3* 7.6* 8.2*  MG  --  1.9  --   PHOS  --  3.2  --    Liver Function Tests: Recent Labs  Lab 05/21/21 2209  AST 15  ALT <5  ALKPHOS 61  BILITOT 1.5*  PROT 6.7  ALBUMIN 3.8   No results for input(s): LIPASE, AMYLASE in the last 168 hours. No results for input(s): AMMONIA in the last 168 hours. CBC: Recent Labs  Lab 05/21/21 2209 05/22/21 0735  WBC 10.1 7.3  NEUTROABS 8.7* 6.2  HGB 13.0 12.6*  HCT 39.9 38.8*  MCV 89.5 91.1  PLT 156 125*   Cardiac Enzymes: No results for input(s): CKTOTAL, CKMB, CKMBINDEX, TROPONINI in the last 168 hours. BNP: Invalid input(s): POCBNP CBG: No results for input(s): GLUCAP in the last 168 hours. D-Dimer No results for input(s): DDIMER in the last 72 hours. Hgb A1c No results for input(s): HGBA1C in the last 72 hours. Lipid Profile No results for input(s): CHOL, HDL, LDLCALC, TRIG, CHOLHDL, LDLDIRECT in the last 72 hours. Thyroid function studies No results for input(s): TSH, T4TOTAL, T3FREE, THYROIDAB in the last 72 hours.  Invalid input(s): FREET3 Anemia work up No results for input(s): VITAMINB12, FOLATE, FERRITIN, TIBC, IRON, RETICCTPCT in the last 72 hours. Urinalysis    Component Value Date/Time   COLORURINE YELLOW (A) 05/22/2021 0759   APPEARANCEUR CLEAR (A) 05/22/2021 0759   APPEARANCEUR Clear 06/24/2013 1454   LABSPEC 1.027 05/22/2021 0759   LABSPEC 1.023 06/24/2013 1454   PHURINE 5.0 05/22/2021 0759   GLUCOSEU NEGATIVE  05/22/2021 0759   GLUCOSEU Negative 06/24/2013 1454   HGBUR NEGATIVE 05/22/2021 0759   BILIRUBINUR NEGATIVE 05/22/2021 0759   BILIRUBINUR Negative 06/24/2013 1454   KETONESUR 5 (A) 05/22/2021 0759   PROTEINUR NEGATIVE 05/22/2021 0759   NITRITE NEGATIVE 05/22/2021 0759   LEUKOCYTESUR  NEGATIVE 05/22/2021 0759   LEUKOCYTESUR Negative 06/24/2013 1454   Sepsis Labs Invalid input(s): PROCALCITONIN,  WBC,  LACTICIDVEN Microbiology Recent Results (from the past 240 hour(s))  Blood Culture (routine x 2)     Status: None (Preliminary result)   Collection Time: 05/21/21 10:09 PM   Specimen: BLOOD  Result Value Ref Range Status   Specimen Description BLOOD LEFT WRIST  Final   Special Requests   Final    BOTTLES DRAWN AEROBIC AND ANAEROBIC Blood Culture adequate volume   Culture   Final    NO GROWTH 4 DAYS Performed at [facility], [address] [place], [place] [address]    Report Status PENDING  Incomplete  Blood Culture (routine x 2)     Status: None (Preliminary result)   Collection Time: 05/21/21 10:09 PM   Specimen: BLOOD  Result Value Ref Range Status   Specimen Description BLOOD LEFT ASSIST CONTROL  Final   Special Requests   Final    BOTTLES DRAWN AEROBIC AND ANAEROBIC Blood Culture adequate volume   Culture   Final    NO GROWTH 4 DAYS Performed at [facility], [address] [place], [place] [address]    Report Status PENDING  Incomplete  Resp Panel by RT-PCR (Flu A&B, Covid) Nasopharyngeal Swab     Status: None   Collection Time: 05/21/21 10:34 PM   Specimen: Nasopharyngeal Swab; Nasopharyngeal(NP) swabs in vial transport medium  Result Value Ref Range Status   SARS Coronavirus 2 by RT PCR NEGATIVE NEGATIVE Final    Comment: (NOTE) SARS-CoV-2 target nucleic acids are NOT DETECTED.  The SARS-CoV-2 RNA is generally detectable in upper respiratory specimens during the acute phase of infection. The lowest concentration of SARS-CoV-2 viral copies  this assay can detect is 138 copies/mL. A negative result does not preclude SARS-Cov-2 infection and should not be used as the sole basis for treatment or other patient management decisions. A negative result may occur with  improper specimen collection/handling, submission of specimen other than nasopharyngeal swab, presence of viral mutation(s) within the areas targeted by this assay, and inadequate number of viral copies(<138 copies/mL). A negative result must be combined with clinical observations, patient history, and epidemiological information. The expected result is Negative.  Fact Sheet for Patients:  EntrepreneurPulse.com.au  Fact Sheet for Healthcare Providers:  IncredibleEmployment.be  This test is no t yet approved or cleared by the [place] FDA and  has been authorized for detection and/or diagnosis of SARS-CoV-2 by FDA under an Emergency Use Authorization (EUA). This EUA will remain  in effect (meaning this test can be used) for the duration of the COVID-19 declaration under Section 564(b)(1) of the Act, 21 U.S.C.section 360bbb-3(b)(1), unless the authorization is terminated  or revoked sooner.       Influenza A by PCR NEGATIVE NEGATIVE Final   Influenza B by PCR NEGATIVE NEGATIVE Final    Comment: (NOTE) The Xpert Xpress SARS-CoV-2/FLU/RSV plus assay is intended as an aid in the diagnosis of influenza from Nasopharyngeal swab specimens and should not be used as a sole basis for treatment. Nasal washings and aspirates are unacceptable for Xpert Xpress SARS-CoV-2/FLU/RSV testing.  Fact Sheet for Patients: EntrepreneurPulse.com.au  Fact Sheet for Healthcare Providers: IncredibleEmployment.be  This test is not yet approved or cleared by the [place] FDA and has been authorized for detection and/or diagnosis of SARS-CoV-2 by FDA under an Emergency Use Authorization (EUA). This EUA  will remain in effect (meaning this test can be used)  for the duration of the COVID-19 declaration under Section 564(b)(1) of the Act, 21 U.S.C. section 360bbb-3(b)(1), unless the authorization is terminated or revoked.  Performed at [facility], [address] [place], [place] [address]   Urine Culture     Status: None   Collection Time: 05/22/21  7:59 AM   Specimen: In/Out Cath Urine  Result Value Ref Range Status   Specimen Description   Final    IN/OUT CATH URINE Performed at [facility], [address] [place], [place] [address]    Special Requests   Final    NONE Performed at [facility], [address] [place], [place] [address]    Culture   Final    NO GROWTH Performed at [facility], [place] [address] [place], [place] [address]    Report Status 05/23/2021 FINAL  Final  MRSA Next Gen by PCR, Nasal     Status: None   Collection Time: 05/22/21  7:59 AM   Specimen: Nasal Swab  Result Value Ref Range Status   MRSA by PCR Next Gen NOT DETECTED NOT DETECTED Final    Comment: (NOTE) The GeneXpert MRSA Assay (FDA approved for NASAL specimens only), is one component of a comprehensive MRSA colonization surveillance program. It is not intended to diagnose MRSA infection nor to guide or monitor treatment for MRSA infections. Test performance is not FDA approved in patients less than 37 years old. Performed at [facility], [address] [place], [place] [address]     Please note: You were cared for by a hospitalist during your hospital stay. Once you are discharged, your primary care physician will handle any further medical issues. Please note that NO REFILLS for any discharge medications will be authorized once you are discharged, as it is imperative that you return to your primary care physician (or establish a relationship with a primary care physician if you do not have one) for your post hospital discharge needs so  that they can reassess your need for medications and monitor your lab values.    Time coordinating discharge: 40 minutes  SIGNED:   Shelly Coss, MD  [facility] 05/25/2021, 10:31 AM Pager [phone]  If 7PM-7AM, please contact night-coverage [email or web address] Password [email or web address]

## 2021-05-26 DIAGNOSIS — J189 Pneumonia, unspecified organism: Secondary | ICD-10-CM | POA: Diagnosis not present

## 2021-05-26 LAB — CULTURE, BLOOD (ROUTINE X 2)
Culture: NO GROWTH
Culture: NO GROWTH
Special Requests: ADEQUATE
Special Requests: ADEQUATE

## 2021-05-28 DIAGNOSIS — M25511 Pain in right shoulder: Secondary | ICD-10-CM | POA: Diagnosis not present

## 2021-05-29 DIAGNOSIS — M25511 Pain in right shoulder: Secondary | ICD-10-CM | POA: Diagnosis not present

## 2021-05-31 DIAGNOSIS — M25511 Pain in right shoulder: Secondary | ICD-10-CM | POA: Diagnosis not present

## 2021-06-02 DIAGNOSIS — M25511 Pain in right shoulder: Secondary | ICD-10-CM | POA: Diagnosis not present

## 2021-06-02 DIAGNOSIS — J13 Pneumonia due to Streptococcus pneumoniae: Secondary | ICD-10-CM | POA: Diagnosis not present

## 2021-06-02 DIAGNOSIS — A419 Sepsis, unspecified organism: Secondary | ICD-10-CM | POA: Diagnosis not present

## 2021-06-05 DIAGNOSIS — M25511 Pain in right shoulder: Secondary | ICD-10-CM | POA: Diagnosis not present

## 2021-06-07 DIAGNOSIS — M25511 Pain in right shoulder: Secondary | ICD-10-CM | POA: Diagnosis not present

## 2021-06-09 DIAGNOSIS — M25511 Pain in right shoulder: Secondary | ICD-10-CM | POA: Diagnosis not present

## 2021-06-13 ENCOUNTER — Other Ambulatory Visit: Payer: Self-pay

## 2021-06-13 ENCOUNTER — Non-Acute Institutional Stay: Payer: Medicare Other | Admitting: Student

## 2021-06-13 DIAGNOSIS — F0280 Dementia in other diseases classified elsewhere without behavioral disturbance: Secondary | ICD-10-CM | POA: Diagnosis not present

## 2021-06-13 DIAGNOSIS — M62838 Other muscle spasm: Secondary | ICD-10-CM | POA: Diagnosis not present

## 2021-06-13 DIAGNOSIS — Z515 Encounter for palliative care: Secondary | ICD-10-CM | POA: Diagnosis not present

## 2021-06-13 DIAGNOSIS — M25511 Pain in right shoulder: Secondary | ICD-10-CM | POA: Diagnosis not present

## 2021-06-13 DIAGNOSIS — G20 Parkinson's disease: Secondary | ICD-10-CM | POA: Diagnosis not present

## 2021-06-13 DIAGNOSIS — R52 Pain, unspecified: Secondary | ICD-10-CM | POA: Diagnosis not present

## 2021-06-16 DIAGNOSIS — M25511 Pain in right shoulder: Secondary | ICD-10-CM | POA: Diagnosis not present

## 2021-06-18 DIAGNOSIS — R251 Tremor, unspecified: Secondary | ICD-10-CM | POA: Diagnosis not present

## 2021-06-18 DIAGNOSIS — G4711 Idiopathic hypersomnia with long sleep time: Secondary | ICD-10-CM | POA: Diagnosis not present

## 2021-06-18 DIAGNOSIS — G20 Parkinson's disease: Secondary | ICD-10-CM | POA: Diagnosis not present

## 2021-06-18 DIAGNOSIS — R413 Other amnesia: Secondary | ICD-10-CM | POA: Diagnosis not present

## 2021-06-18 DIAGNOSIS — M25511 Pain in right shoulder: Secondary | ICD-10-CM | POA: Diagnosis not present

## 2021-06-18 DIAGNOSIS — R441 Visual hallucinations: Secondary | ICD-10-CM | POA: Diagnosis not present

## 2021-06-18 DIAGNOSIS — G4752 REM sleep behavior disorder: Secondary | ICD-10-CM | POA: Diagnosis not present

## 2021-06-19 DIAGNOSIS — M25511 Pain in right shoulder: Secondary | ICD-10-CM | POA: Diagnosis not present

## 2021-06-19 NOTE — Progress Notes (Signed)
Therapist, nutritional Palliative Care Consult Note Telephone: [phone]  Fax: [phone]   Date of encounter: 06/13/21 9:21 AM PATIENT NAME: Lucas Edwards [address] [place] [address]   [phone] (home)  DOB: 1951/01/29 MRN: [id number] PRIMARY CARE PROVIDER:    Karie Schwalbe, MD,  [address] [place] [address] [phone]  REFERRING PROVIDER:   Karie Schwalbe, MD [address] [place],  [place] [address] [phone]  RESPONSIBLE PARTY:    Contact Information     Name Relation Home Work Mobile   Sicks,Stephanie Niece [phone]  [phone]   Couch,[name] Relative [phone]  [phone]   couch,charles Other   [phone]        I met face to face with patient in the facility. Palliative Care was asked to follow this patient by consultation request of  Karie Schwalbe, MD to address advance care planning and complex medical decision making. This is the initial visit.                                     ASSESSMENT AND PLAN / RECOMMENDATIONS:   Advance Care Planning/Goals of Care: Goals include to maximize quality of life and symptom management. Patient/health care surrogate gave his/her permission to discuss.Our advance care planning conversation included a discussion about:    The value and importance of advance care planning  Experiences with loved ones who have been seriously ill or have died  Exploration of personal, cultural or spiritual beliefs that might influence medical decisions  Exploration of goals of care in the event of a sudden injury or illness  CODE STATUS: DNR  Education on Palliative Medicine vs. Hospice services. Patient is open to hospitalizations as needed. Palliative will provide ongoing support, symptom management.   Symptom Management/Plan:  Parkinson's disease-patient requires assist with all adl's. Has periods where he sleeps extended periods of time.  Monitor for worsening dysphagia. Continue carbidopa-levodopa as directed. Follow up with neurology as scheduled.   Dementia-reorient and redirect as needed. Staff to assist with adl's, monitor for falls/safety. Continue olanzapine as directed.   Pain, muscle spasms- Continue meloxicam QD PRN, tizanidine every 8 hours PRN muscle spasms, tramadol every 12 hours PRN.   Follow up Palliative Care Visit: Palliative care will continue to follow for complex medical decision making, advance care planning, and clarification of goals. Return in 8 weeks or prn.   This visit was coded based on medical decision making (MDM).  PPS: 50%  HOSPICE ELIGIBILITY/DIAGNOSIS: TBD  Chief Complaint: Palliative Medicine initial consult.   HISTORY OF PRESENT ILLNESS:  Lucas Edwards is a 71 y.o. year old male  with Parkinson's disease, dementia, constipation, GERD, essential hypertension, REM behavioral disorder, muscle spasms, history of falls.  Patient resides at [facility].  Nursing staff reports patient requires assistance with ADLs.  He has.'s where he sleeps extended periods of time.  He endorses generalized pain, muscle spasms.  Today he reports "aching in belly."  Occasional shortness of breath with exertion. He uses rollator walker when he ambulates; shuffled gait. Increased stiffness reported. He is receiving a regular diet; occasional difficulty chewing or swallowing.  Patient received sitting in dining room. He engages in conversations, although he falls asleep at end of visit.   History obtained from review of EMR, discussion with primary team, and interview with family, facility staff/caregiver and/or  Mr. Bakeman.  I reviewed available labs, medications, imaging, studies and related documents from the EMR.  Records reviewed and summarized above.   ROS  General: NAD EYES: denies vision changes ENMT: denies dysphagia Cardiovascular: denies chest pain Pulmonary: denies cough, SOB  w/exertion Abdomen: endorses fair appetite, denies constipation GU: denies dysuria MSK:  shuffled gait, occasional falls reported Skin: denies rashes or wounds Neurological: denies insomnia Psych: Endorses stable mood Heme/lymph/immuno: denies bruises, abnormal bleeding  Physical Exam:  Weight:156.4 pounds Constitutional: NAD, EYES: anicteric sclera, lids intact, no discharge  ENMT: intact hearing, oral mucous membranes moist CV: S1S2, RRR, no LE edema Pulmonary: LCTA, no increased work of breathing, no cough, sats 99% on room air Abdomen: normo-active BS + 4 quadrants, soft and non tender, no ascites GU: deferred MSK: moves all extremities, ambulatory Skin: warm and dry, no rashes or wounds on visible skin Neuro:  generalized weakness, A & O x 2, forgetful Psych: non-anxious affect Hem/lymph/immuno: no widespread bruising CURRENT PROBLEM LIST:  Patient Active Problem List   Diagnosis Date Noted   Acute hypoxemic respiratory failure (HCC) 05/22/2021   Altered mental state 05/22/2021   Severe sepsis (HCC) 05/22/2021   PNA (pneumonia) 05/21/2021   GERD (gastroesophageal reflux disease) 02/26/2018   Essential hypertension, benign    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HCC)    Urinary incontinence    REM behavioral disorder 10/24/2013   PAST MEDICAL HISTORY:  Active Ambulatory Problems    Diagnosis Date Noted   REM behavioral disorder 10/24/2013   Essential hypertension, benign    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HCC)    Urinary incontinence    GERD (gastroesophageal reflux disease) 02/26/2018   PNA (pneumonia) 05/21/2021   Acute hypoxemic respiratory failure (HCC) 05/22/2021   Altered mental state 05/22/2021   Severe sepsis (HCC) 05/22/2021   Resolved Ambulatory Problems    Diagnosis Date Noted   Unstable angina (HCC) 05/14/2017   Hip fx (HCC) 08/05/2017   Parkinson disease (HCC)    Past Medical History:  Diagnosis Date   Abdominal hernia     Basal cell carcinoma 06/30/2017   Hyperlipidemia    Squamous cell carcinoma of skin 04/22/2017   Squamous cell carcinoma of skin 06/05/2019   SOCIAL HX:  Social History   Tobacco Use   Smoking status: Former   Smokeless tobacco: Never   Tobacco comments:    during high school/college  Substance Use Topics   Alcohol use: Yes    Comment: whenever he gets the chance (once every few months)   FAMILY HX:  Family History  Problem Relation Age of Onset   Multiple sclerosis Mother        died @ 43   Stroke Father        died @ 53   Heart attack Father    Lung cancer Sister        died in her 71's      ALLERGIES:  Allergies  Allergen Reactions   Lorazepam Other (See Comments)    confusion   Sulfa Antibiotics Hives     PERTINENT MEDICATIONS:  Outpatient Encounter Medications as of 06/13/2021  Medication Sig   amoxicillin (AMOXIL) 500 MG capsule Take 2,000 mg by mouth as directed. Before Dental Procedures   carbidopa-levodopa (SINEMET IR) 25-100 MG tablet Take 3-4 tablets by mouth every 2 (two) hours. 4 AM, 6 AM, 8 AM, 10 AM, NOON, 2 PM, 4 PM, 6 PM, (8 PM 3 TABLETS)   docusate sodium (COLACE) 100 MG  capsule Take 200 mg by mouth at bedtime.   donepezil (ARICEPT) 10 MG tablet Take 10 mg by mouth every evening.  (Patient not taking: Reported on 05/22/2021)   escitalopram (LEXAPRO) 10 MG tablet Take 10 mg by mouth daily.   guaiFENesin-dextromethorphan (ROBITUSSIN DM) 100-10 MG/5ML syrup Take 5 mLs by mouth every 4 (four) hours as needed for cough.   hydrocortisone 2.5 % cream Apply 1 application topically daily at 12 noon.   Lidocaine 4 % PTCH Apply 1 patch topically daily as needed for pain.   meloxicam (MOBIC) 15 MG tablet Take 15 mg by mouth daily as needed for pain.   OLANZapine (ZYPREXA) 5 MG tablet Take 5 mg by mouth at bedtime.   pantoprazole (PROTONIX) 40 MG tablet Take 40 mg by mouth 2 (two) times daily.   polyethylene glycol powder (GLYCOLAX/MIRALAX) 17 GM/SCOOP powder  Take 17 g by mouth daily as needed for constipation.   senna-docusate (SENOKOT-S) 8.6-50 MG tablet Take 1 tablet by mouth 2 (two) times daily.   tiZANidine (ZANAFLEX) 2 MG tablet Take 2 mg by mouth every 8 (eight) hours as needed for muscle spasms.   traMADol (ULTRAM) 50 MG tablet Take 50 mg by mouth 2 (two) times daily as needed for pain.   No facility-administered encounter medications on file as of 06/13/2021.   Thank you for the opportunity to participate in the care of Mr. Tokarski.  The palliative care team will continue to follow. Please call our office at [phone] if we can be of additional assistance.   Luella Cook, NP   COVID-19 PATIENT SCREENING TOOL Asked and negative response unless otherwise noted:  Have you had symptoms of covid, tested positive or been in contact with someone with symptoms/positive test in the past 5-10 days? No

## 2021-06-20 DIAGNOSIS — M25511 Pain in right shoulder: Secondary | ICD-10-CM | POA: Diagnosis not present

## 2021-06-23 DIAGNOSIS — M25511 Pain in right shoulder: Secondary | ICD-10-CM | POA: Diagnosis not present

## 2021-06-26 DIAGNOSIS — M25511 Pain in right shoulder: Secondary | ICD-10-CM | POA: Diagnosis not present

## 2021-06-28 ENCOUNTER — Emergency Department
Admission: EM | Admit: 2021-06-28 | Discharge: 2021-06-28 | Disposition: A | Discharge status: Home / Self Care | Payer: Medicare Other | Attending: Emergency Medicine | Admitting: Emergency Medicine

## 2021-06-28 ENCOUNTER — Other Ambulatory Visit: Payer: Self-pay

## 2021-06-28 ENCOUNTER — Emergency Department: Payer: Medicare Other

## 2021-06-28 DIAGNOSIS — I959 Hypotension, unspecified: Secondary | ICD-10-CM | POA: Diagnosis not present

## 2021-06-28 DIAGNOSIS — R4182 Altered mental status, unspecified: Secondary | ICD-10-CM | POA: Diagnosis not present

## 2021-06-28 DIAGNOSIS — G20 Parkinson's disease: Secondary | ICD-10-CM | POA: Diagnosis not present

## 2021-06-28 DIAGNOSIS — N390 Urinary tract infection, site not specified: Secondary | ICD-10-CM | POA: Diagnosis not present

## 2021-06-28 DIAGNOSIS — M6281 Muscle weakness (generalized): Secondary | ICD-10-CM | POA: Diagnosis not present

## 2021-06-28 DIAGNOSIS — B9689 Other specified bacterial agents as the cause of diseases classified elsewhere: Secondary | ICD-10-CM | POA: Insufficient documentation

## 2021-06-28 DIAGNOSIS — Z7401 Bed confinement status: Secondary | ICD-10-CM | POA: Diagnosis not present

## 2021-06-28 DIAGNOSIS — M25552 Pain in left hip: Secondary | ICD-10-CM | POA: Diagnosis not present

## 2021-06-28 DIAGNOSIS — F0390 Unspecified dementia without behavioral disturbance: Secondary | ICD-10-CM | POA: Diagnosis not present

## 2021-06-28 DIAGNOSIS — K5900 Constipation, unspecified: Secondary | ICD-10-CM | POA: Diagnosis not present

## 2021-06-28 DIAGNOSIS — R29898 Other symptoms and signs involving the musculoskeletal system: Secondary | ICD-10-CM | POA: Diagnosis not present

## 2021-06-28 DIAGNOSIS — R5381 Other malaise: Secondary | ICD-10-CM | POA: Diagnosis not present

## 2021-06-28 DIAGNOSIS — S0101XA Laceration without foreign body of scalp, initial encounter: Secondary | ICD-10-CM | POA: Diagnosis not present

## 2021-06-28 DIAGNOSIS — R001 Bradycardia, unspecified: Secondary | ICD-10-CM | POA: Diagnosis not present

## 2021-06-28 DIAGNOSIS — I10 Essential (primary) hypertension: Secondary | ICD-10-CM | POA: Diagnosis not present

## 2021-06-28 DIAGNOSIS — R52 Pain, unspecified: Secondary | ICD-10-CM | POA: Diagnosis not present

## 2021-06-28 DIAGNOSIS — W19XXXA Unspecified fall, initial encounter: Secondary | ICD-10-CM | POA: Diagnosis not present

## 2021-06-28 DIAGNOSIS — Z96642 Presence of left artificial hip joint: Secondary | ICD-10-CM | POA: Diagnosis not present

## 2021-06-28 LAB — CBC WITH DIFFERENTIAL/PLATELET
Abs Immature Granulocytes: 0.06 10*3/uL (ref 0.00–0.07)
Basophils Absolute: 0 10*3/uL (ref 0.0–0.1)
Basophils Relative: 0 %
Eosinophils Absolute: 0 10*3/uL (ref 0.0–0.5)
Eosinophils Relative: 0 %
HCT: 45.6 % (ref 39.0–52.0)
Hemoglobin: 14.7 g/dL (ref 13.0–17.0)
Immature Granulocytes: 0 %
Lymphocytes Relative: 4 %
Lymphs Abs: 0.6 10*3/uL — ABNORMAL LOW (ref 0.7–4.0)
MCH: 28.8 pg (ref 26.0–34.0)
MCHC: 32.2 g/dL (ref 30.0–36.0)
MCV: 89.4 fL (ref 80.0–100.0)
Monocytes Absolute: 0.7 10*3/uL (ref 0.1–1.0)
Monocytes Relative: 5 %
Neutro Abs: 13.3 10*3/uL — ABNORMAL HIGH (ref 1.7–7.7)
Neutrophils Relative %: 91 %
Platelets: 247 10*3/uL (ref 150–400)
RBC: 5.1 MIL/uL (ref 4.22–5.81)
RDW: 13.8 % (ref 11.5–15.5)
WBC: 14.7 10*3/uL — ABNORMAL HIGH (ref 4.0–10.5)
nRBC: 0 % (ref 0.0–0.2)

## 2021-06-28 LAB — URINALYSIS, COMPLETE (UACMP) WITH MICROSCOPIC
Bilirubin Urine: NEGATIVE
Glucose, UA: NEGATIVE mg/dL
Ketones, ur: 20 mg/dL — AB
Nitrite: NEGATIVE
Protein, ur: 30 mg/dL — AB
Specific Gravity, Urine: 1.024 (ref 1.005–1.030)
pH: 5 (ref 5.0–8.0)

## 2021-06-28 LAB — BASIC METABOLIC PANEL
Anion gap: 9 (ref 5–15)
BUN: 25 mg/dL — ABNORMAL HIGH (ref 8–23)
CO2: 26 mmol/L (ref 22–32)
Calcium: 9.2 mg/dL (ref 8.9–10.3)
Chloride: 106 mmol/L (ref 98–111)
Creatinine, Ser: 1.44 mg/dL — ABNORMAL HIGH (ref 0.61–1.24)
GFR, Estimated: 53 mL/min — ABNORMAL LOW (ref >60)
Glucose, Bld: 145 mg/dL — ABNORMAL HIGH (ref 70–99)
Potassium: 4.2 mmol/L (ref 3.5–5.1)
Sodium: 141 mmol/L (ref 135–145)

## 2021-06-28 MED ORDER — CEFTRIAXONE SODIUM 1 G IJ SOLR
1.0000 g | Freq: Once | INTRAMUSCULAR | Status: AC
  Administered 2021-06-28: 1 g via INTRAMUSCULAR
  Filled 2021-06-28: qty 10

## 2021-06-28 MED ORDER — LIDOCAINE HCL (PF) 1 % IJ SOLN: 2.0000 mL | Freq: Once | INTRAMUSCULAR | Status: AC

## 2021-06-28 MED ORDER — SODIUM CHLORIDE 0.9 % IV SOLN: 1.0000 g | Freq: Once | INTRAVENOUS | Status: DC

## 2021-06-28 MED ORDER — LIDOCAINE HCL (PF) 1 % IJ SOLN
INTRAMUSCULAR | Status: AC
  Administered 2021-06-28: 2 mL
  Filled 2021-06-28: qty 5

## 2021-06-28 MED ORDER — CEPHALEXIN 500 MG PO CAPS
500.0000 mg | ORAL_CAPSULE | Freq: Four times a day (QID) | ORAL | 0 refills | Status: AC
Start: 2021-06-28 — End: 2021-07-08

## 2021-06-28 MED ORDER — LIDOCAINE 1% INJECTION FOR CIRCUMCISION: 2.0000 mL | INJECTION | Freq: Once | INTRAVENOUS | Status: DC

## 2021-06-28 MED ORDER — SODIUM CHLORIDE 0.9 % IV BOLUS
500.0000 mL | Freq: Once | INTRAVENOUS | Status: AC
  Administered 2021-06-28: 500 mL via INTRAVENOUS

## 2021-06-28 NOTE — Discharge Instructions (Signed)
Please seek medical attention for any high fevers, chest pain, shortness of breath, change in behavior, persistent vomiting, bloody stool or any other new or concerning symptoms.  

## 2021-06-28 NOTE — ED Notes (Addendum)
Discharge instructions, DNR and papers given to EMS transport. This nurse will call report to the facility. Attempted to call [facility] but was unsuccessful in getting through. ?

## 2021-06-28 NOTE — ED Notes (Signed)
Patient resting comfortably on stretcher in room. RR even and unlabored. Patient verbalizes no needs or complaints. Patient has been given a urinal to provide a specimen. This nurse has encouraged him to do so. I have explained that he may need to be in and out cathed for a specimen if he is unable to provide a specimen.  ?

## 2021-06-28 NOTE — ED Notes (Addendum)
In and out cath performed on patient. Patient had removed his purewick and all monitoring equipment. Patient has made two attempts to get out of bed. Patient has been reminded to remain in the bed until he is discharged. Patient provided with blanket and beverage per request. Monitoring equipment has been placed back on the patient. ?

## 2021-06-28 NOTE — ED Notes (Signed)
[facility] to transport pt to [facility] ?

## 2021-06-28 NOTE — ED Provider Notes (Signed)
? ?[facility] ?Provider Note ? ? ? Event Date/Time  ? First MD Initiated Contact with Patient 06/28/21 1728   ?  (approximate) ? ? ?History  ? ?Fall ? ? ?HPI ? ?Lucas Edwards is a 71 y.o. male  who, per neurology note dated 06/18/21 has history of parkinsons disease, who presents to the emergency department via EMS from living facility.  Apparently the patient has had increased altered mental status per staff.  Does have baseline dementia but apparently he was more altered.  Additionally it sounds like he might have fallen.  Patient himself has complaints for constipation.  He states that this happens to him frequently and that he will disimpact himself. ? ?  ? ? ?Physical Exam  ? ?Triage Vital Signs: ?ED Triage Vitals  ?Enc Vitals Group  ?   BP 06/28/21 1725 95/64  ?   Pulse Rate 06/28/21 1725 87  ?   Resp 06/28/21 1725 19  ?   Temp 06/28/21 1725 98 ?F (36.7 ?C)  ?   Temp Source 06/28/21 1725 Oral  ?   SpO2 06/28/21 1725 94 %  ?   Weight 06/28/21 1726 164 lb 14.5 oz (74.8 kg)  ?   Height 06/28/21 1726 '5\' 9"'$  (1.753 m)  ?   Head Circumference --   ?   Peak Flow --   ?   Pain Score 06/28/21 1726 0  ?   Pain Loc --   ?   Pain Edu? --   ?   Excl. in [place]? --   ? ? ?Most recent vital signs: ?Vitals:  ? 06/28/21 1725  ?BP: 95/64  ?Pulse: 87  ?Resp: 19  ?Temp: 98 ?F (36.7 ?C)  ?SpO2: 94%  ? ? ?General: Awake, no distress.  ?CV:  Good peripheral perfusion. Regular rate and rhythm. ?Resp:  Normal effort. Clear to auscultation ?Abd:  No distention. Non tender. No palpable mass. ? ? ?ED Results / Procedures / Treatments  ? ?Labs ?(all labs ordered are listed, but only abnormal results are displayed) ?Labs Reviewed  ?CBC WITH DIFFERENTIAL/PLATELET - Abnormal; Notable for the following components:  ?    Result Value  ? WBC 14.7 (*)   ? Neutro Abs 13.3 (*)   ? Lymphs Abs 0.6 (*)   ? All other components within normal limits  ?URINALYSIS, COMPLETE (UACMP) WITH MICROSCOPIC  ?BASIC METABOLIC PANEL   ? ? ? ?EKG ? ?INance Pear, attending physician, personally viewed and interpreted this EKG ? ?EKG Time: 1720 ?Rate: 86 ?Rhythm: sinus rhythm ?Axis: normal ?Intervals: qtc 582 ?QRS: narrow ?ST changes: no st elevation ?Impression: abnormal ekg ? ?RADIOLOGY ?I independently interpreted and visualized the ct head. My interpretation: No bleed. No large mass ?Radiology interpretation:  ?IMPRESSION  ?No acute intracranial process.  ?   ? ? ? ? ?PROCEDURES: ? ?Critical Care performed: No ? ?Procedures ? ? ?MEDICATIONS ORDERED IN ED: ?Medications - No data to display ? ? ?IMPRESSION / MDM / ASSESSMENT AND PLAN / ED COURSE  ?I reviewed the triage vital signs and the nursing notes. ?             ?               ? ?Differential diagnosis includes, but is not limited to, intracranial process, urinary tract infection. ? ?Patient presented to the emergency department from living facility today because of concerns for increased confusion and a fall.  No evidence of trauma on physical  exam but I did obtain a head CT.  Did not show any intracranial bleeding or acute osseous abnormality.  Patient's blood work showed slight elevation of his creatinine over baseline.  He was given IV fluids.  UA was concerning for possible urinary tract infection.  I do think this could explain some of the patient's confusion.  Because of this he was given dose of antibiotics here in the emergency department.  Will plan on discharging with further antibiotics. ? ?FINAL CLINICAL IMPRESSION(S) / ED DIAGNOSES  ? ?Final diagnoses:  ?Lower urinary tract infectious disease  ? ? ? ?Note:  This document was prepared using Dragon voice recognition software and may include unintentional dictation errors. ? ?  ?Nance Pear, MD ?06/28/21 2140 ? ?

## 2021-06-28 NOTE — ED Triage Notes (Addendum)
Pt presents to ED with c/o of having AMS per staff at [facility]. Pt is A&Ox4 at this time. Pt has a HX of dementia, pt states he has been more altered at this time. VSS. Pt is oriented to person, place and place at this time.  ? ?EMS reports pt had multiple falls today and one where he put a hole in the wall, EMS and pt denies blood thinner use. No bruising or swelling noted. Pt denies pain on urination.  ? ?Pt does report constipation but also states he had a BM today, ABD is not firm or distended at this time.  ?

## 2021-06-29 ENCOUNTER — Emergency Department: Payer: Medicare Other

## 2021-06-29 ENCOUNTER — Encounter: Payer: Self-pay | Admitting: Emergency Medicine

## 2021-06-29 ENCOUNTER — Emergency Department
Admission: EM | Admit: 2021-06-29 | Discharge: 2021-06-29 | Disposition: A | Discharge status: Home / Self Care | Payer: Medicare Other | Attending: Emergency Medicine | Admitting: Emergency Medicine

## 2021-06-29 ENCOUNTER — Other Ambulatory Visit: Payer: Self-pay

## 2021-06-29 DIAGNOSIS — S0101XA Laceration without foreign body of scalp, initial encounter: Secondary | ICD-10-CM | POA: Diagnosis not present

## 2021-06-29 DIAGNOSIS — D72829 Elevated white blood cell count, unspecified: Secondary | ICD-10-CM | POA: Diagnosis not present

## 2021-06-29 DIAGNOSIS — F0280 Dementia in other diseases classified elsewhere without behavioral disturbance: Secondary | ICD-10-CM | POA: Diagnosis not present

## 2021-06-29 DIAGNOSIS — R4182 Altered mental status, unspecified: Secondary | ICD-10-CM | POA: Diagnosis not present

## 2021-06-29 DIAGNOSIS — W19XXXA Unspecified fall, initial encounter: Secondary | ICD-10-CM

## 2021-06-29 DIAGNOSIS — W1839XA Other fall on same level, initial encounter: Secondary | ICD-10-CM | POA: Insufficient documentation

## 2021-06-29 DIAGNOSIS — I10 Essential (primary) hypertension: Secondary | ICD-10-CM | POA: Insufficient documentation

## 2021-06-29 DIAGNOSIS — Y9301 Activity, walking, marching and hiking: Secondary | ICD-10-CM | POA: Diagnosis not present

## 2021-06-29 DIAGNOSIS — Z043 Encounter for examination and observation following other accident: Secondary | ICD-10-CM | POA: Diagnosis not present

## 2021-06-29 DIAGNOSIS — G20 Parkinson's disease: Secondary | ICD-10-CM | POA: Insufficient documentation

## 2021-06-29 DIAGNOSIS — R296 Repeated falls: Secondary | ICD-10-CM | POA: Diagnosis not present

## 2021-06-29 DIAGNOSIS — T7421XA Adult sexual abuse, confirmed, initial encounter: Secondary | ICD-10-CM | POA: Diagnosis not present

## 2021-06-29 DIAGNOSIS — R918 Other nonspecific abnormal finding of lung field: Secondary | ICD-10-CM | POA: Diagnosis not present

## 2021-06-29 DIAGNOSIS — Z85828 Personal history of other malignant neoplasm of skin: Secondary | ICD-10-CM | POA: Diagnosis not present

## 2021-06-29 DIAGNOSIS — R079 Chest pain, unspecified: Secondary | ICD-10-CM | POA: Diagnosis not present

## 2021-06-29 DIAGNOSIS — Z7401 Bed confinement status: Secondary | ICD-10-CM | POA: Diagnosis not present

## 2021-06-29 DIAGNOSIS — S0990XA Unspecified injury of head, initial encounter: Secondary | ICD-10-CM | POA: Diagnosis present

## 2021-06-29 DIAGNOSIS — M47812 Spondylosis without myelopathy or radiculopathy, cervical region: Secondary | ICD-10-CM | POA: Diagnosis not present

## 2021-06-29 DIAGNOSIS — R58 Hemorrhage, not elsewhere classified: Secondary | ICD-10-CM | POA: Diagnosis not present

## 2021-06-29 DIAGNOSIS — Z23 Encounter for immunization: Secondary | ICD-10-CM | POA: Diagnosis not present

## 2021-06-29 DIAGNOSIS — S0191XA Laceration without foreign body of unspecified part of head, initial encounter: Secondary | ICD-10-CM | POA: Diagnosis not present

## 2021-06-29 LAB — COMPREHENSIVE METABOLIC PANEL
ALT: <5 U/L (ref 0–44)
AST: 19 U/L (ref 15–41)
Albumin: 4.4 g/dL (ref 3.5–5.0)
Alkaline Phosphatase: 77 U/L (ref 38–126)
Anion gap: 10 (ref 5–15)
BUN: 27 mg/dL — ABNORMAL HIGH (ref 8–23)
CO2: 27 mmol/L (ref 22–32)
Calcium: 9.1 mg/dL (ref 8.9–10.3)
Chloride: 103 mmol/L (ref 98–111)
Creatinine, Ser: 1.28 mg/dL — ABNORMAL HIGH (ref 0.61–1.24)
GFR, Estimated: >60 mL/min (ref >60)
Glucose, Bld: 122 mg/dL — ABNORMAL HIGH (ref 70–99)
Potassium: 3.7 mmol/L (ref 3.5–5.1)
Sodium: 140 mmol/L (ref 135–145)
Total Bilirubin: 1.6 mg/dL — ABNORMAL HIGH (ref 0.3–1.2)
Total Protein: 7.5 g/dL (ref 6.5–8.1)

## 2021-06-29 LAB — CBC WITH DIFFERENTIAL/PLATELET
Abs Immature Granulocytes: 0.09 10*3/uL — ABNORMAL HIGH (ref 0.00–0.07)
Basophils Absolute: 0.1 10*3/uL (ref 0.0–0.1)
Basophils Relative: 0 %
Eosinophils Absolute: 0 10*3/uL (ref 0.0–0.5)
Eosinophils Relative: 0 %
HCT: 42.8 % (ref 39.0–52.0)
Hemoglobin: 13.8 g/dL (ref 13.0–17.0)
Immature Granulocytes: 1 %
Lymphocytes Relative: 6 %
Lymphs Abs: 0.8 10*3/uL (ref 0.7–4.0)
MCH: 28.9 pg (ref 26.0–34.0)
MCHC: 32.2 g/dL (ref 30.0–36.0)
MCV: 89.5 fL (ref 80.0–100.0)
Monocytes Absolute: 1 10*3/uL (ref 0.1–1.0)
Monocytes Relative: 7 %
Neutro Abs: 12.4 10*3/uL — ABNORMAL HIGH (ref 1.7–7.7)
Neutrophils Relative %: 86 %
Platelets: 186 10*3/uL (ref 150–400)
RBC: 4.78 MIL/uL (ref 4.22–5.81)
RDW: 14.2 % (ref 11.5–15.5)
WBC: 14.5 10*3/uL — ABNORMAL HIGH (ref 4.0–10.5)
nRBC: 0 % (ref 0.0–0.2)

## 2021-06-29 MED ORDER — TETANUS-DIPHTH-ACELL PERTUSSIS 5-2.5-18.5 LF-MCG/0.5 IM SUSY
0.5000 mL | PREFILLED_SYRINGE | Freq: Once | INTRAMUSCULAR | Status: AC
  Administered 2021-06-29: 0.5 mL via INTRAMUSCULAR
  Filled 2021-06-29: qty 0.5

## 2021-06-29 MED ORDER — LIDOCAINE-EPINEPHRINE 2 %-1:100000 IJ SOLN
20.0000 mL | Freq: Once | INTRAMUSCULAR | Status: AC
  Administered 2021-06-29: 5 mL
  Filled 2021-06-29: qty 1

## 2021-06-29 NOTE — ED Triage Notes (Signed)
Pt ems from [facility] s/p fall. Per ems pt has posterior head lac with bleeding controlled.  ?

## 2021-06-29 NOTE — Discharge Instructions (Signed)
Please have the staples removed in about 10 days. Please continue to take the antibiotic that you were prescribed yesterday for urinary tract infection.  ?

## 2021-06-29 NOTE — ED Notes (Addendum)
Bed exit alarm on and working. ?

## 2021-06-29 NOTE — ED Provider Notes (Signed)
? ?[facility] ?Provider Note ? ? ? Event Date/Time  ? First MD Initiated Contact with Patient 06/29/21 [phone]   ?  (approximate) ? ? ?History  ? ?Fall ? ? ?HPI ? ?Lucas Edwards is a 71 y.o. male with past medical history of Parkinson's disease, dementia, GERD, hyperlipidemia hypertension who presents after a fall.  Patient is seen last night in the ED for alteration in mental status, found to have mild leukocytosis and UA consistent with UTI was ultimately discharged after receiving IV fluids.  Today he got out of bed was walking from his bedroom when he had a fall.  Patient somewhat unclear if this was mechanical or if he passed out.  Hit the back of his head.  Complains of pain in the back of his head.  Denies any shortness of breath, chest pain fevers chills urinary symptoms.  Denies cough. ?  ? ?Past Medical History:  ?Diagnosis Date  ? Abdominal hernia   ? Basal cell carcinoma 06/30/2017  ? R mid back parapsinal  ? 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[place])   ? Essential hypertension, benign   ? GERD (gastroesophageal reflux disease)   ? Hyperlipidemia   ? Parkinson disease ([place])   ? Squamous cell carcinoma of skin 04/22/2017  ? mid vertex scalp  ? Squamous cell carcinoma of skin 06/05/2019  ? crown scalp  ? Urinary incontinence   ? ? ?Patient Active Problem List  ? Diagnosis Date Noted  ? Acute hypoxemic respiratory failure ([place]) 05/22/2021  ? Altered mental state 05/22/2021  ? Severe sepsis ([place]) 05/22/2021  ? PNA (pneumonia) 05/21/2021  ? GERD (gastroesophageal reflux disease) 02/26/2018  ? Essential hypertension, benign   ? 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[place])   ? Urinary incontinence   ? REM behavioral disorder 10/24/2013  ? ? ? ?Physical Exam  ?Triage Vital Signs: ?ED Triage Vitals  ?Enc Vitals Group  ?   BP 06/29/21 0915 (!) 143/86  ?   Pulse Rate 06/29/21 0915 89  ?   Resp 06/29/21 0915 (!) 31  ?   Temp --   ?   Temp src --   ?   SpO2 06/29/21 0915 97 %  ?    Weight 06/29/21 0918 164 lb 14.5 oz (74.8 kg)  ?   Height 06/29/21 0918 '5\' 9"'$  (1.753 m)  ?   Head Circumference --   ?   Peak Flow --   ?   Pain Score --   ?   Pain Loc --   ?   Pain Edu? --   ?   Excl. in [place]? --   ? ? ?Most recent vital signs: ?Vitals:  ? 06/29/21 0915 06/29/21 0928  ?BP: (!) 143/86   ?Pulse: 89   ?Resp: (!) 31   ?Temp:  (!) 97.4 ?F (36.3 ?C)  ?SpO2: 97%   ? ? ? ?General: Awake, no distress.  ?CV:  Good peripheral perfusion.  ?Resp:  Normal effort.  ?Abd:  No distention.  Soft and nontender throughout ?Neuro:             Awake, Alert, Oriented x 3  ?Other:  Patient has approximately 4 cm linear laceration to posterior occiput is well approximated and hemostatic ?No C-spine tenderness ?Patient able to range both hips bilaterally, 5 out of 5 strength with plantarflexion dorsiflexion bilaterally ? ? ?ED Results / Procedures / Treatments  ?Labs ?(all labs ordered are listed, but only abnormal results are displayed) ?Labs  Reviewed  ?COMPREHENSIVE METABOLIC PANEL - Abnormal; Notable for the following components:  ?    Result Value  ? Glucose, Bld 122 (*)   ? BUN 27 (*)   ? Creatinine, Ser 1.28 (*)   ? Total Bilirubin 1.6 (*)   ? All other components within normal limits  ?CBC WITH DIFFERENTIAL/PLATELET - Abnormal; Notable for the following components:  ? WBC 14.5 (*)   ? Neutro Abs 12.4 (*)   ? Abs Immature Granulocytes 0.09 (*)   ? All other components within normal limits  ? ? ? ?EKG ? ?EKG interpreted by myself, normal sinus rhythm normal axis normal intervals, T wave flattening in the lateral leads ? ? ?RADIOLOGY ?I reviewed the CXR which does not show any acute cardiopulmonary process; agree with radiology report  ? ? ? ? ?PROCEDURES: ? ?Critical Care performed: No ? ?.1-3 Lead EKG Interpretation ?Performed by: Rada Hay, MD ?Authorized by: Rada Hay, MD  ? ?  Interpretation: normal   ?  ECG rate assessment: normal   ?  Ectopy: none   ?  Conduction: normal   ?..Laceration  Repair ? ?Date/Time: 06/29/2021 11:15 AM ?Performed by: Rada Hay, MD ?Authorized by: Rada Hay, MD  ? ?Consent:  ?  Consent obtained:  Verbal ?  Alternatives discussed:  No treatment ?Universal protocol:  ?  Patient identity confirmed:  Verbally with patient ?Anesthesia:  ?  Anesthesia method:  Local infiltration ?  Local anesthetic:  Lidocaine 1% WITH epi ?Laceration details:  ?  Location:  Scalp ?  Scalp location:  Occipital ?  Length (cm):  5 ?Pre-procedure details:  ?  Preparation:  Imaging obtained to evaluate for foreign bodies ?Exploration:  ?  Hemostasis achieved with:  Direct pressure ?  Imaging outcome: foreign body not noted   ?  Wound extent: areolar tissue violated and fascia violated   ?  Contaminated: no   ?Treatment:  ?  Area cleansed with:  Saline ?  Amount of cleaning:  Standard ?  Irrigation solution:  Sterile saline ?  Irrigation volume:  100 ?  Irrigation method:  Pressure wash ?  Debridement:  None ?  Undermining:  None ?  Scar revision: no   ?  Layers/structures repaired:  [place] ?B[place] Cedar:  ?  Suture size:  6-0 ?  Suture material:  Vicryl ?  Suture technique:  Simple interrupted ?  Number of sutures:  3 ?Skin repair:  ?  Repair method:  Staples ?  Number of staples:  8 ?Approximation:  ?  Approximation:  Close ?Repair type:  ?  Repair type:  Intermediate ?Post-procedure details:  ?  Dressing:  Open (no dressing) ?  Procedure completion:  Tolerated ? ?The patient is on the cardiac monitor to evaluate for evidence of arrhythmia and/or significant heart rate changes. ? ? ?MEDICATIONS ORDERED IN ED: ?Medications  ?lidocaine-EPINEPHrine (XYLOCAINE W/EPI) 2 %-1:100000 (with pres) injection 20 mL (5 mLs Infiltration Given by Other 06/29/21 1033)  ?Tdap (BOOSTRIX) injection 0.5 mL (0.5 mLs Intramuscular Given 06/29/21 1021)  ? ? ? ?IMPRESSION / MDM / ASSESSMENT AND PLAN / ED COURSE  ?I reviewed the triage vital signs and the nursing notes. ?             ?               ? ?Differential  diagnosis includes, but is not limited to, mechanical fall, syncope, intracranial hemorrhage, contusion ? ?Patient is a 71 year old male with  Parkinson disease presents after a fall.  Sent to the ED yesterday with some change in mental status per his facility and was diagnosed with UTI ultimately discharged.  Presents again today after a fall somewhat unclear from the patient's history whether this was syncope versus mechanical fall.  He has a linear laceration the posterior occiput no other signs of trauma.  He is awake and alert and nonfocal neurologic exam.  Mild tachypnea vital signs otherwise within normal limits.  Plan to check basic screening labs again despite these being checked yesterday and to ensure no increasing leukocytosis or AKI worsening as this was seen yesterday.  Will obtain CT head and C-spine as well as a chest x-ray. ? ?Patient's white count is not significantly changed from yesterday and renal function is improving. ? ?On further evaluation after laceration was irrigated does appear to involve the galea.  This was closed primarily with absorbable sutures followed by staples.  Patient seems to be at his baseline in terms of mental status.  CT head and C-spine are negative for acute injury.  I think that he is appropriate for discharge.  Recommended continuing the antibiotics that he was prescribed yesterday. ? ?  ? ? ?FINAL CLINICAL IMPRESSION(S) / ED DIAGNOSES  ? ?Final diagnoses:  ?Fall, initial encounter  ?Laceration of scalp, initial encounter  ? ? ? ?Rx / DC Orders  ? ?ED Discharge Orders   ? ? None  ? ?  ? ? ? ?Note:  This document was prepared using Dragon voice recognition software and may include unintentional dictation errors. ?  ?Rada Hay, MD ?06/29/21 1117 ? ?

## 2021-06-30 DIAGNOSIS — M25511 Pain in right shoulder: Secondary | ICD-10-CM | POA: Diagnosis not present

## 2021-06-30 DIAGNOSIS — R2689 Other abnormalities of gait and mobility: Secondary | ICD-10-CM | POA: Diagnosis not present

## 2021-06-30 DIAGNOSIS — G20 Parkinson's disease: Secondary | ICD-10-CM | POA: Diagnosis not present

## 2021-06-30 DIAGNOSIS — S0101XA Laceration without foreign body of scalp, initial encounter: Secondary | ICD-10-CM | POA: Diagnosis not present

## 2021-06-30 DIAGNOSIS — R32 Unspecified urinary incontinence: Secondary | ICD-10-CM | POA: Diagnosis not present

## 2021-06-30 DIAGNOSIS — T4275XA Adverse effect of unspecified antiepileptic and sedative-hypnotic drugs, initial encounter: Secondary | ICD-10-CM | POA: Diagnosis not present

## 2021-06-30 DIAGNOSIS — R4189 Other symptoms and signs involving cognitive functions and awareness: Secondary | ICD-10-CM | POA: Diagnosis not present

## 2021-06-30 DIAGNOSIS — R278 Other lack of coordination: Secondary | ICD-10-CM | POA: Diagnosis not present

## 2021-06-30 DIAGNOSIS — M62838 Other muscle spasm: Secondary | ICD-10-CM | POA: Diagnosis not present

## 2021-06-30 DIAGNOSIS — R296 Repeated falls: Secondary | ICD-10-CM | POA: Diagnosis not present

## 2021-06-30 DIAGNOSIS — R2681 Unsteadiness on feet: Secondary | ICD-10-CM | POA: Diagnosis not present

## 2021-06-30 DIAGNOSIS — W19XXXD Unspecified fall, subsequent encounter: Secondary | ICD-10-CM | POA: Diagnosis not present

## 2021-06-30 DIAGNOSIS — F0280 Dementia in other diseases classified elsewhere without behavioral disturbance: Secondary | ICD-10-CM | POA: Diagnosis not present

## 2021-06-30 DIAGNOSIS — F39 Unspecified mood [affective] disorder: Secondary | ICD-10-CM | POA: Diagnosis not present

## 2021-06-30 LAB — URINE CULTURE: Culture: NO GROWTH

## 2021-07-01 DIAGNOSIS — R2681 Unsteadiness on feet: Secondary | ICD-10-CM | POA: Diagnosis not present

## 2021-07-01 DIAGNOSIS — R32 Unspecified urinary incontinence: Secondary | ICD-10-CM | POA: Diagnosis not present

## 2021-07-01 DIAGNOSIS — G20 Parkinson's disease: Secondary | ICD-10-CM | POA: Diagnosis not present

## 2021-07-01 DIAGNOSIS — M62838 Other muscle spasm: Secondary | ICD-10-CM | POA: Diagnosis not present

## 2021-07-01 DIAGNOSIS — M25511 Pain in right shoulder: Secondary | ICD-10-CM | POA: Diagnosis not present

## 2021-07-01 DIAGNOSIS — R2689 Other abnormalities of gait and mobility: Secondary | ICD-10-CM | POA: Diagnosis not present

## 2021-07-05 DIAGNOSIS — R2689 Other abnormalities of gait and mobility: Secondary | ICD-10-CM | POA: Diagnosis not present

## 2021-07-05 DIAGNOSIS — G20 Parkinson's disease: Secondary | ICD-10-CM | POA: Diagnosis not present

## 2021-07-05 DIAGNOSIS — M62838 Other muscle spasm: Secondary | ICD-10-CM | POA: Diagnosis not present

## 2021-07-05 DIAGNOSIS — M25511 Pain in right shoulder: Secondary | ICD-10-CM | POA: Diagnosis not present

## 2021-07-05 DIAGNOSIS — R2681 Unsteadiness on feet: Secondary | ICD-10-CM | POA: Diagnosis not present

## 2021-07-05 DIAGNOSIS — R32 Unspecified urinary incontinence: Secondary | ICD-10-CM | POA: Diagnosis not present

## 2021-07-06 DIAGNOSIS — M25511 Pain in right shoulder: Secondary | ICD-10-CM | POA: Diagnosis not present

## 2021-07-06 DIAGNOSIS — G20 Parkinson's disease: Secondary | ICD-10-CM | POA: Diagnosis not present

## 2021-07-06 DIAGNOSIS — R2689 Other abnormalities of gait and mobility: Secondary | ICD-10-CM | POA: Diagnosis not present

## 2021-07-06 DIAGNOSIS — R32 Unspecified urinary incontinence: Secondary | ICD-10-CM | POA: Diagnosis not present

## 2021-07-06 DIAGNOSIS — M62838 Other muscle spasm: Secondary | ICD-10-CM | POA: Diagnosis not present

## 2021-07-06 DIAGNOSIS — R2681 Unsteadiness on feet: Secondary | ICD-10-CM | POA: Diagnosis not present

## 2021-07-07 DIAGNOSIS — R2689 Other abnormalities of gait and mobility: Secondary | ICD-10-CM | POA: Diagnosis not present

## 2021-07-07 DIAGNOSIS — R32 Unspecified urinary incontinence: Secondary | ICD-10-CM | POA: Diagnosis not present

## 2021-07-07 DIAGNOSIS — M62838 Other muscle spasm: Secondary | ICD-10-CM | POA: Diagnosis not present

## 2021-07-07 DIAGNOSIS — R2681 Unsteadiness on feet: Secondary | ICD-10-CM | POA: Diagnosis not present

## 2021-07-07 DIAGNOSIS — G20 Parkinson's disease: Secondary | ICD-10-CM | POA: Diagnosis not present

## 2021-07-07 DIAGNOSIS — M25511 Pain in right shoulder: Secondary | ICD-10-CM | POA: Diagnosis not present

## 2021-07-10 DIAGNOSIS — M25511 Pain in right shoulder: Secondary | ICD-10-CM | POA: Diagnosis not present

## 2021-07-10 DIAGNOSIS — R2681 Unsteadiness on feet: Secondary | ICD-10-CM | POA: Diagnosis not present

## 2021-07-10 DIAGNOSIS — M62838 Other muscle spasm: Secondary | ICD-10-CM | POA: Diagnosis not present

## 2021-07-10 DIAGNOSIS — R2689 Other abnormalities of gait and mobility: Secondary | ICD-10-CM | POA: Diagnosis not present

## 2021-07-10 DIAGNOSIS — G20 Parkinson's disease: Secondary | ICD-10-CM | POA: Diagnosis not present

## 2021-07-10 DIAGNOSIS — R32 Unspecified urinary incontinence: Secondary | ICD-10-CM | POA: Diagnosis not present

## 2021-07-12 DIAGNOSIS — M25511 Pain in right shoulder: Secondary | ICD-10-CM | POA: Diagnosis not present

## 2021-07-12 DIAGNOSIS — R2681 Unsteadiness on feet: Secondary | ICD-10-CM | POA: Diagnosis not present

## 2021-07-12 DIAGNOSIS — R2689 Other abnormalities of gait and mobility: Secondary | ICD-10-CM | POA: Diagnosis not present

## 2021-07-12 DIAGNOSIS — R32 Unspecified urinary incontinence: Secondary | ICD-10-CM | POA: Diagnosis not present

## 2021-07-12 DIAGNOSIS — G20 Parkinson's disease: Secondary | ICD-10-CM | POA: Diagnosis not present

## 2021-07-12 DIAGNOSIS — M62838 Other muscle spasm: Secondary | ICD-10-CM | POA: Diagnosis not present

## 2021-07-17 DIAGNOSIS — R32 Unspecified urinary incontinence: Secondary | ICD-10-CM | POA: Diagnosis not present

## 2021-07-17 DIAGNOSIS — R2681 Unsteadiness on feet: Secondary | ICD-10-CM | POA: Diagnosis not present

## 2021-07-17 DIAGNOSIS — M25511 Pain in right shoulder: Secondary | ICD-10-CM | POA: Diagnosis not present

## 2021-07-17 DIAGNOSIS — M62838 Other muscle spasm: Secondary | ICD-10-CM | POA: Diagnosis not present

## 2021-07-17 DIAGNOSIS — R2689 Other abnormalities of gait and mobility: Secondary | ICD-10-CM | POA: Diagnosis not present

## 2021-07-17 DIAGNOSIS — G20 Parkinson's disease: Secondary | ICD-10-CM | POA: Diagnosis not present

## 2021-07-19 DIAGNOSIS — M62838 Other muscle spasm: Secondary | ICD-10-CM | POA: Diagnosis not present

## 2021-07-19 DIAGNOSIS — R2689 Other abnormalities of gait and mobility: Secondary | ICD-10-CM | POA: Diagnosis not present

## 2021-07-19 DIAGNOSIS — R2681 Unsteadiness on feet: Secondary | ICD-10-CM | POA: Diagnosis not present

## 2021-07-19 DIAGNOSIS — R32 Unspecified urinary incontinence: Secondary | ICD-10-CM | POA: Diagnosis not present

## 2021-07-19 DIAGNOSIS — M25511 Pain in right shoulder: Secondary | ICD-10-CM | POA: Diagnosis not present

## 2021-07-19 DIAGNOSIS — G20 Parkinson's disease: Secondary | ICD-10-CM | POA: Diagnosis not present

## 2021-07-21 DIAGNOSIS — Z20822 Contact with and (suspected) exposure to covid-19: Secondary | ICD-10-CM | POA: Diagnosis not present

## 2021-07-24 DIAGNOSIS — F39 Unspecified mood [affective] disorder: Secondary | ICD-10-CM | POA: Diagnosis not present

## 2021-07-24 DIAGNOSIS — M159 Polyosteoarthritis, unspecified: Secondary | ICD-10-CM | POA: Diagnosis not present

## 2021-07-24 DIAGNOSIS — R2689 Other abnormalities of gait and mobility: Secondary | ICD-10-CM | POA: Diagnosis not present

## 2021-07-24 DIAGNOSIS — R2681 Unsteadiness on feet: Secondary | ICD-10-CM | POA: Diagnosis not present

## 2021-07-24 DIAGNOSIS — R32 Unspecified urinary incontinence: Secondary | ICD-10-CM | POA: Diagnosis not present

## 2021-07-24 DIAGNOSIS — M25511 Pain in right shoulder: Secondary | ICD-10-CM | POA: Diagnosis not present

## 2021-07-24 DIAGNOSIS — K219 Gastro-esophageal reflux disease without esophagitis: Secondary | ICD-10-CM | POA: Diagnosis not present

## 2021-07-24 DIAGNOSIS — M62838 Other muscle spasm: Secondary | ICD-10-CM | POA: Diagnosis not present

## 2021-07-24 DIAGNOSIS — G20 Parkinson's disease: Secondary | ICD-10-CM | POA: Diagnosis not present

## 2021-07-24 DIAGNOSIS — F0283 Dementia in other diseases classified elsewhere, unspecified severity, with mood disturbance: Secondary | ICD-10-CM | POA: Diagnosis not present

## 2021-07-28 DIAGNOSIS — I10 Essential (primary) hypertension: Secondary | ICD-10-CM | POA: Diagnosis not present

## 2021-07-28 LAB — HEPATIC FUNCTION PANEL
AST: 10 — AB (ref 14–40)
Alkaline Phosphatase: 99 (ref 25–125)
Bilirubin, Total: 0.9

## 2021-07-28 LAB — BASIC METABOLIC PANEL
BUN: 19 (ref 4–21)
CO2: 30 — AB (ref 13–22)
Chloride: 100 (ref 99–108)
Creatinine: 1.1 (ref 0.6–1.3)
Glucose: 75
Potassium: 3.9 mEq/L (ref 3.5–5.1)
Sodium: 137 (ref 137–147)

## 2021-07-28 LAB — CBC AND DIFFERENTIAL
HCT: 43 (ref 41–53)
Hemoglobin: 14.4 (ref 13.5–17.5)
Neutrophils Absolute: 4289
Platelets: 207 10*3/uL (ref 150–400)
WBC: 5.9

## 2021-07-28 LAB — COMPREHENSIVE METABOLIC PANEL
Albumin: 4.4 (ref 3.5–5.0)
Calcium: 9.4 (ref 8.7–10.7)
Globulin: 2.9

## 2021-07-28 LAB — CBC: RBC: 4.82 (ref 3.87–5.11)

## 2021-07-30 DIAGNOSIS — R42 Dizziness and giddiness: Secondary | ICD-10-CM | POA: Diagnosis not present

## 2021-07-30 DIAGNOSIS — F0280 Dementia in other diseases classified elsewhere without behavioral disturbance: Secondary | ICD-10-CM | POA: Diagnosis not present

## 2021-07-30 DIAGNOSIS — R441 Visual hallucinations: Secondary | ICD-10-CM | POA: Diagnosis not present

## 2021-07-30 DIAGNOSIS — R251 Tremor, unspecified: Secondary | ICD-10-CM | POA: Diagnosis not present

## 2021-07-30 DIAGNOSIS — G20 Parkinson's disease: Secondary | ICD-10-CM | POA: Diagnosis not present

## 2021-07-30 DIAGNOSIS — G4752 REM sleep behavior disorder: Secondary | ICD-10-CM | POA: Diagnosis not present

## 2021-07-30 DIAGNOSIS — G4711 Idiopathic hypersomnia with long sleep time: Secondary | ICD-10-CM | POA: Diagnosis not present

## 2021-08-01 DIAGNOSIS — Z20822 Contact with and (suspected) exposure to covid-19: Secondary | ICD-10-CM | POA: Diagnosis not present

## 2021-08-19 DIAGNOSIS — F39 Unspecified mood [affective] disorder: Secondary | ICD-10-CM | POA: Diagnosis not present

## 2021-08-19 DIAGNOSIS — R2689 Other abnormalities of gait and mobility: Secondary | ICD-10-CM | POA: Diagnosis not present

## 2021-08-19 DIAGNOSIS — G20 Parkinson's disease: Secondary | ICD-10-CM | POA: Diagnosis not present

## 2021-08-19 DIAGNOSIS — R296 Repeated falls: Secondary | ICD-10-CM | POA: Diagnosis not present

## 2021-08-19 DIAGNOSIS — R278 Other lack of coordination: Secondary | ICD-10-CM | POA: Diagnosis not present

## 2021-08-19 DIAGNOSIS — R2681 Unsteadiness on feet: Secondary | ICD-10-CM | POA: Diagnosis not present

## 2021-08-20 ENCOUNTER — Non-Acute Institutional Stay: Payer: Medicare Other | Admitting: Student

## 2021-08-20 DIAGNOSIS — G20 Parkinson's disease: Secondary | ICD-10-CM

## 2021-08-20 DIAGNOSIS — Z515 Encounter for palliative care: Secondary | ICD-10-CM

## 2021-08-20 DIAGNOSIS — F0280 Dementia in other diseases classified elsewhere without behavioral disturbance: Secondary | ICD-10-CM

## 2021-08-20 NOTE — Progress Notes (Unsigned)
Therapist, nutritional Palliative Care Consult Note Telephone: [phone]  Fax: [phone]    Date of encounter: 08/20/21 4:37 PM PATIENT NAME: Lucas Edwards [address] [place] [address]   [phone] (home)  DOB: [birth year] MRN: [id number] PRIMARY CARE PROVIDER:    Karie Schwalbe, MD,  [address] [place] [address] [phone]  REFERRING PROVIDER:   Karie Schwalbe, MD [address] [place],  [place] [address] [phone]  RESPONSIBLE PARTY:    Contact Information     Name Relation Home Work Mobile   Sicks,Stephanie Niece [phone]  [phone]   Couch,[name] Relative [phone]  [phone]   couch,charles Other   [phone]        I met face to face with patient in the facility. Palliative Care was asked to follow this patient by consultation request of  Karie Schwalbe, MD to address advance care planning and complex medical decision making. This is a follow up visit.                                   ASSESSMENT AND PLAN / RECOMMENDATIONS:   Advance Care Planning/Goals of Care: Goals include to maximize quality of life and symptom management. Patient/health care surrogate gave his/her permission to discuss. CODE STATUS: DNR  Symptom Management/Plan:  Parkinson's disease- continue Sinemet 25/100 as directed routinely and Sinemet 50/200 QHS as directed. Monitor for worsening symptoms. Patient continues boxing 2 times a week. Follow up with neurology as scheduled.   Dementia- patient requires assistance with adl's. He has days where he sleeps more. He is more forgetful, difficulty with word finding. Staff to continue assisting with adl's. Redirect and reorient as needed. Monitor for falls/safety. Continue using walker for ambulation.   Follow up Palliative Care Visit: Palliative care will continue to follow for complex medical decision making, advance care planning, and clarification of  goals. Return in 8 weeks or prn.  This visit was coded based on medical decision making (MDM).  PPS: 50%  HOSPICE ELIGIBILITY/DIAGNOSIS: TBD  Chief Complaint: Palliative Medicine follow up visit.   HISTORY OF PRESENT ILLNESS:  Lucas Edwards is a 71 y.o. year old male  with Parkinson's disease, dementia, constipation, GERD, essential hypertension, REM behavioral disorder, muscle spasms, history of falls. ED visits on 4/1 d/t UTI, ED visit on 06/29/21 d/t fall.   Patient resides at [facility]. He is just returning form Bible study. He is ambulatory with walker. Staff report patient having days where he continues to sleep more. He requires assistance with adl's. States no worsening of tremors. He does boxing twice a week. He denies pain at present; does report back pain and muscle spasms. He states he has been moving his bowels. A 10-point ROS is negative, except for the pertinent positives and negatives detailed per the HPI.   History obtained from review of EMR, discussion with primary team, and interview with family, facility staff/caregiver and/or Mr. Squibb.  I reviewed available labs, medications, imaging, studies and related documents from the EMR.  Records reviewed and summarized above.   Physical Exam: Constitutional: NAD General: frail appearing, thin  EYES: anicteric sclera, lids intact, no discharge  ENMT: intact hearing, oral mucous membranes moist, dentition intact CV: S1S2, RRR, no LE edema Pulmonary: LCTA, no increased work of breathing, no cough, room air Abdomen:normo-active BS + 4 quadrants, soft and non  tender, no ascites GU: deferred MSK: moves all extremities, ambulatory Skin: warm and dry, no rashes or wounds on visible skin Neuro: tremor, generalized weakness, A & O x 2, forgetful Psych: non-anxious affect, pleasant Hem/lymph/immuno: no widespread bruising   Thank you for the opportunity to participate in the care of Mr. Petraitis.  The palliative care team will  continue to follow. Please call our office at [phone] if we can be of additional assistance.   Ezekiel Slocumb, NP   COVID-19 PATIENT SCREENING TOOL Asked and negative response unless otherwise noted:   Have you had symptoms of covid, tested positive or been in contact with someone with symptoms/positive test in the past 5-10 days? No

## 2021-08-25 DIAGNOSIS — R296 Repeated falls: Secondary | ICD-10-CM | POA: Diagnosis not present

## 2021-08-25 DIAGNOSIS — R2689 Other abnormalities of gait and mobility: Secondary | ICD-10-CM | POA: Diagnosis not present

## 2021-08-25 DIAGNOSIS — G20 Parkinson's disease: Secondary | ICD-10-CM | POA: Diagnosis not present

## 2021-08-25 DIAGNOSIS — R278 Other lack of coordination: Secondary | ICD-10-CM | POA: Diagnosis not present

## 2021-08-25 DIAGNOSIS — F39 Unspecified mood [affective] disorder: Secondary | ICD-10-CM | POA: Diagnosis not present

## 2021-08-25 DIAGNOSIS — R2681 Unsteadiness on feet: Secondary | ICD-10-CM | POA: Diagnosis not present

## 2021-08-26 DIAGNOSIS — Z23 Encounter for immunization: Secondary | ICD-10-CM | POA: Diagnosis not present

## 2021-08-29 DIAGNOSIS — M1990 Unspecified osteoarthritis, unspecified site: Secondary | ICD-10-CM | POA: Diagnosis not present

## 2021-08-29 DIAGNOSIS — K219 Gastro-esophageal reflux disease without esophagitis: Secondary | ICD-10-CM | POA: Diagnosis not present

## 2021-08-29 DIAGNOSIS — F0153 Vascular dementia, unspecified severity, with mood disturbance: Secondary | ICD-10-CM | POA: Diagnosis not present

## 2021-08-29 DIAGNOSIS — G20 Parkinson's disease: Secondary | ICD-10-CM | POA: Diagnosis not present

## 2021-08-29 DIAGNOSIS — F22 Delusional disorders: Secondary | ICD-10-CM | POA: Diagnosis not present

## 2021-08-29 DIAGNOSIS — K5900 Constipation, unspecified: Secondary | ICD-10-CM | POA: Diagnosis not present

## 2021-08-31 DIAGNOSIS — R2689 Other abnormalities of gait and mobility: Secondary | ICD-10-CM | POA: Diagnosis not present

## 2021-08-31 DIAGNOSIS — R278 Other lack of coordination: Secondary | ICD-10-CM | POA: Diagnosis not present

## 2021-08-31 DIAGNOSIS — G20 Parkinson's disease: Secondary | ICD-10-CM | POA: Diagnosis not present

## 2021-08-31 DIAGNOSIS — R2681 Unsteadiness on feet: Secondary | ICD-10-CM | POA: Diagnosis not present

## 2021-08-31 DIAGNOSIS — R296 Repeated falls: Secondary | ICD-10-CM | POA: Diagnosis not present

## 2021-08-31 DIAGNOSIS — F39 Unspecified mood [affective] disorder: Secondary | ICD-10-CM | POA: Diagnosis not present

## 2021-09-06 DIAGNOSIS — R296 Repeated falls: Secondary | ICD-10-CM | POA: Diagnosis not present

## 2021-09-06 DIAGNOSIS — G20 Parkinson's disease: Secondary | ICD-10-CM | POA: Diagnosis not present

## 2021-09-06 DIAGNOSIS — R278 Other lack of coordination: Secondary | ICD-10-CM | POA: Diagnosis not present

## 2021-09-06 DIAGNOSIS — R2681 Unsteadiness on feet: Secondary | ICD-10-CM | POA: Diagnosis not present

## 2021-09-06 DIAGNOSIS — R2689 Other abnormalities of gait and mobility: Secondary | ICD-10-CM | POA: Diagnosis not present

## 2021-09-06 DIAGNOSIS — F39 Unspecified mood [affective] disorder: Secondary | ICD-10-CM | POA: Diagnosis not present

## 2021-09-10 DIAGNOSIS — F39 Unspecified mood [affective] disorder: Secondary | ICD-10-CM | POA: Diagnosis not present

## 2021-09-10 DIAGNOSIS — R2689 Other abnormalities of gait and mobility: Secondary | ICD-10-CM | POA: Diagnosis not present

## 2021-09-10 DIAGNOSIS — R2681 Unsteadiness on feet: Secondary | ICD-10-CM | POA: Diagnosis not present

## 2021-09-10 DIAGNOSIS — R296 Repeated falls: Secondary | ICD-10-CM | POA: Diagnosis not present

## 2021-09-10 DIAGNOSIS — G20 Parkinson's disease: Secondary | ICD-10-CM | POA: Diagnosis not present

## 2021-09-10 DIAGNOSIS — R278 Other lack of coordination: Secondary | ICD-10-CM | POA: Diagnosis not present

## 2021-09-12 DIAGNOSIS — G20 Parkinson's disease: Secondary | ICD-10-CM | POA: Diagnosis not present

## 2021-09-12 DIAGNOSIS — R2689 Other abnormalities of gait and mobility: Secondary | ICD-10-CM | POA: Diagnosis not present

## 2021-09-12 DIAGNOSIS — R2681 Unsteadiness on feet: Secondary | ICD-10-CM | POA: Diagnosis not present

## 2021-09-12 DIAGNOSIS — R296 Repeated falls: Secondary | ICD-10-CM | POA: Diagnosis not present

## 2021-09-12 DIAGNOSIS — F39 Unspecified mood [affective] disorder: Secondary | ICD-10-CM | POA: Diagnosis not present

## 2021-09-12 DIAGNOSIS — R278 Other lack of coordination: Secondary | ICD-10-CM | POA: Diagnosis not present

## 2021-09-20 DIAGNOSIS — R278 Other lack of coordination: Secondary | ICD-10-CM | POA: Diagnosis not present

## 2021-09-20 DIAGNOSIS — F39 Unspecified mood [affective] disorder: Secondary | ICD-10-CM | POA: Diagnosis not present

## 2021-09-20 DIAGNOSIS — R2689 Other abnormalities of gait and mobility: Secondary | ICD-10-CM | POA: Diagnosis not present

## 2021-09-20 DIAGNOSIS — R296 Repeated falls: Secondary | ICD-10-CM | POA: Diagnosis not present

## 2021-09-20 DIAGNOSIS — R2681 Unsteadiness on feet: Secondary | ICD-10-CM | POA: Diagnosis not present

## 2021-09-20 DIAGNOSIS — G20 Parkinson's disease: Secondary | ICD-10-CM | POA: Diagnosis not present

## 2021-09-27 DIAGNOSIS — G20 Parkinson's disease: Secondary | ICD-10-CM | POA: Diagnosis not present

## 2021-09-27 DIAGNOSIS — R278 Other lack of coordination: Secondary | ICD-10-CM | POA: Diagnosis not present

## 2021-09-27 DIAGNOSIS — R2689 Other abnormalities of gait and mobility: Secondary | ICD-10-CM | POA: Diagnosis not present

## 2021-09-27 DIAGNOSIS — R296 Repeated falls: Secondary | ICD-10-CM | POA: Diagnosis not present

## 2021-09-27 DIAGNOSIS — R2681 Unsteadiness on feet: Secondary | ICD-10-CM | POA: Diagnosis not present

## 2021-09-27 DIAGNOSIS — F39 Unspecified mood [affective] disorder: Secondary | ICD-10-CM | POA: Diagnosis not present

## 2021-09-29 DIAGNOSIS — R2689 Other abnormalities of gait and mobility: Secondary | ICD-10-CM | POA: Diagnosis not present

## 2021-09-29 DIAGNOSIS — R296 Repeated falls: Secondary | ICD-10-CM | POA: Diagnosis not present

## 2021-09-29 DIAGNOSIS — F39 Unspecified mood [affective] disorder: Secondary | ICD-10-CM | POA: Diagnosis not present

## 2021-09-29 DIAGNOSIS — R2681 Unsteadiness on feet: Secondary | ICD-10-CM | POA: Diagnosis not present

## 2021-09-29 DIAGNOSIS — R278 Other lack of coordination: Secondary | ICD-10-CM | POA: Diagnosis not present

## 2021-09-29 DIAGNOSIS — G20 Parkinson's disease: Secondary | ICD-10-CM | POA: Diagnosis not present

## 2021-10-01 DIAGNOSIS — F39 Unspecified mood [affective] disorder: Secondary | ICD-10-CM | POA: Diagnosis not present

## 2021-10-01 DIAGNOSIS — R296 Repeated falls: Secondary | ICD-10-CM | POA: Diagnosis not present

## 2021-10-01 DIAGNOSIS — R2681 Unsteadiness on feet: Secondary | ICD-10-CM | POA: Diagnosis not present

## 2021-10-01 DIAGNOSIS — G20 Parkinson's disease: Secondary | ICD-10-CM | POA: Diagnosis not present

## 2021-10-01 DIAGNOSIS — R2689 Other abnormalities of gait and mobility: Secondary | ICD-10-CM | POA: Diagnosis not present

## 2021-10-01 DIAGNOSIS — R278 Other lack of coordination: Secondary | ICD-10-CM | POA: Diagnosis not present

## 2021-10-03 DIAGNOSIS — M25571 Pain in right ankle and joints of right foot: Secondary | ICD-10-CM | POA: Diagnosis not present

## 2021-10-04 DIAGNOSIS — R278 Other lack of coordination: Secondary | ICD-10-CM | POA: Diagnosis not present

## 2021-10-04 DIAGNOSIS — R2689 Other abnormalities of gait and mobility: Secondary | ICD-10-CM | POA: Diagnosis not present

## 2021-10-04 DIAGNOSIS — F39 Unspecified mood [affective] disorder: Secondary | ICD-10-CM | POA: Diagnosis not present

## 2021-10-04 DIAGNOSIS — R296 Repeated falls: Secondary | ICD-10-CM | POA: Diagnosis not present

## 2021-10-04 DIAGNOSIS — G20 Parkinson's disease: Secondary | ICD-10-CM | POA: Diagnosis not present

## 2021-10-04 DIAGNOSIS — R2681 Unsteadiness on feet: Secondary | ICD-10-CM | POA: Diagnosis not present

## 2021-10-09 DIAGNOSIS — G20 Parkinson's disease: Secondary | ICD-10-CM | POA: Diagnosis not present

## 2021-10-09 DIAGNOSIS — R2689 Other abnormalities of gait and mobility: Secondary | ICD-10-CM | POA: Diagnosis not present

## 2021-10-09 DIAGNOSIS — R278 Other lack of coordination: Secondary | ICD-10-CM | POA: Diagnosis not present

## 2021-10-09 DIAGNOSIS — F39 Unspecified mood [affective] disorder: Secondary | ICD-10-CM | POA: Diagnosis not present

## 2021-10-09 DIAGNOSIS — R296 Repeated falls: Secondary | ICD-10-CM | POA: Diagnosis not present

## 2021-10-09 DIAGNOSIS — R2681 Unsteadiness on feet: Secondary | ICD-10-CM | POA: Diagnosis not present

## 2021-10-11 DIAGNOSIS — R2689 Other abnormalities of gait and mobility: Secondary | ICD-10-CM | POA: Diagnosis not present

## 2021-10-11 DIAGNOSIS — R2681 Unsteadiness on feet: Secondary | ICD-10-CM | POA: Diagnosis not present

## 2021-10-11 DIAGNOSIS — G20 Parkinson's disease: Secondary | ICD-10-CM | POA: Diagnosis not present

## 2021-10-11 DIAGNOSIS — R296 Repeated falls: Secondary | ICD-10-CM | POA: Diagnosis not present

## 2021-10-11 DIAGNOSIS — R278 Other lack of coordination: Secondary | ICD-10-CM | POA: Diagnosis not present

## 2021-10-11 DIAGNOSIS — F39 Unspecified mood [affective] disorder: Secondary | ICD-10-CM | POA: Diagnosis not present

## 2021-10-18 DIAGNOSIS — F39 Unspecified mood [affective] disorder: Secondary | ICD-10-CM | POA: Diagnosis not present

## 2021-10-18 DIAGNOSIS — R2681 Unsteadiness on feet: Secondary | ICD-10-CM | POA: Diagnosis not present

## 2021-10-18 DIAGNOSIS — R278 Other lack of coordination: Secondary | ICD-10-CM | POA: Diagnosis not present

## 2021-10-18 DIAGNOSIS — G20 Parkinson's disease: Secondary | ICD-10-CM | POA: Diagnosis not present

## 2021-10-18 DIAGNOSIS — R2689 Other abnormalities of gait and mobility: Secondary | ICD-10-CM | POA: Diagnosis not present

## 2021-10-18 DIAGNOSIS — R296 Repeated falls: Secondary | ICD-10-CM | POA: Diagnosis not present

## 2021-10-23 DIAGNOSIS — R2681 Unsteadiness on feet: Secondary | ICD-10-CM | POA: Diagnosis not present

## 2021-10-23 DIAGNOSIS — R278 Other lack of coordination: Secondary | ICD-10-CM | POA: Diagnosis not present

## 2021-10-23 DIAGNOSIS — R2689 Other abnormalities of gait and mobility: Secondary | ICD-10-CM | POA: Diagnosis not present

## 2021-10-23 DIAGNOSIS — F39 Unspecified mood [affective] disorder: Secondary | ICD-10-CM | POA: Diagnosis not present

## 2021-10-23 DIAGNOSIS — R296 Repeated falls: Secondary | ICD-10-CM | POA: Diagnosis not present

## 2021-10-23 DIAGNOSIS — G20 Parkinson's disease: Secondary | ICD-10-CM | POA: Diagnosis not present

## 2021-10-25 DIAGNOSIS — R2681 Unsteadiness on feet: Secondary | ICD-10-CM | POA: Diagnosis not present

## 2021-10-25 DIAGNOSIS — G20 Parkinson's disease: Secondary | ICD-10-CM | POA: Diagnosis not present

## 2021-10-25 DIAGNOSIS — R278 Other lack of coordination: Secondary | ICD-10-CM | POA: Diagnosis not present

## 2021-10-25 DIAGNOSIS — R899 Unspecified abnormal finding in specimens from other organs, systems and tissues: Secondary | ICD-10-CM | POA: Diagnosis not present

## 2021-10-25 DIAGNOSIS — R296 Repeated falls: Secondary | ICD-10-CM | POA: Diagnosis not present

## 2021-10-25 DIAGNOSIS — R2689 Other abnormalities of gait and mobility: Secondary | ICD-10-CM | POA: Diagnosis not present

## 2021-10-25 DIAGNOSIS — F39 Unspecified mood [affective] disorder: Secondary | ICD-10-CM | POA: Diagnosis not present

## 2021-10-30 DIAGNOSIS — R2681 Unsteadiness on feet: Secondary | ICD-10-CM | POA: Diagnosis not present

## 2021-10-30 DIAGNOSIS — G20 Parkinson's disease: Secondary | ICD-10-CM | POA: Diagnosis not present

## 2021-10-30 DIAGNOSIS — R441 Visual hallucinations: Secondary | ICD-10-CM | POA: Diagnosis not present

## 2021-10-30 DIAGNOSIS — F39 Unspecified mood [affective] disorder: Secondary | ICD-10-CM | POA: Diagnosis not present

## 2021-10-30 DIAGNOSIS — F0280 Dementia in other diseases classified elsewhere without behavioral disturbance: Secondary | ICD-10-CM | POA: Diagnosis not present

## 2021-10-30 DIAGNOSIS — R4182 Altered mental status, unspecified: Secondary | ICD-10-CM | POA: Diagnosis not present

## 2021-10-30 DIAGNOSIS — R278 Other lack of coordination: Secondary | ICD-10-CM | POA: Diagnosis not present

## 2021-10-30 DIAGNOSIS — G4711 Idiopathic hypersomnia with long sleep time: Secondary | ICD-10-CM | POA: Diagnosis not present

## 2021-10-30 DIAGNOSIS — R296 Repeated falls: Secondary | ICD-10-CM | POA: Diagnosis not present

## 2021-10-30 DIAGNOSIS — R251 Tremor, unspecified: Secondary | ICD-10-CM | POA: Diagnosis not present

## 2021-10-30 DIAGNOSIS — G4752 REM sleep behavior disorder: Secondary | ICD-10-CM | POA: Diagnosis not present

## 2021-10-30 DIAGNOSIS — R42 Dizziness and giddiness: Secondary | ICD-10-CM | POA: Diagnosis not present

## 2021-10-30 DIAGNOSIS — R2689 Other abnormalities of gait and mobility: Secondary | ICD-10-CM | POA: Diagnosis not present

## 2021-10-30 LAB — HEPATIC FUNCTION PANEL
ALT: 3 U/L — AB (ref 10–40)
AST: 9 — AB (ref 14–40)
Alkaline Phosphatase: 78 (ref 25–125)
Bilirubin, Total: 0.7

## 2021-10-30 LAB — BASIC METABOLIC PANEL
BUN: 20 (ref 4–21)
CO2: 27 — AB (ref 13–22)
Chloride: 104 (ref 99–108)
Creatinine: 1 (ref 0.6–1.3)
Glucose: 94
Potassium: 4 mEq/L (ref 3.5–5.1)
Sodium: 138 (ref 137–147)

## 2021-10-30 LAB — CBC AND DIFFERENTIAL
HCT: 41 (ref 41–53)
Hemoglobin: 13.8 (ref 13.5–17.5)
Platelets: 199 10*3/uL (ref 150–400)
WBC: 5.6

## 2021-10-30 LAB — COMPREHENSIVE METABOLIC PANEL
Albumin: 4.2 (ref 3.5–5.0)
Calcium: 9.2 (ref 8.7–10.7)
Globulin: 2.6

## 2021-10-30 LAB — CBC: RBC: 4.64 (ref 3.87–5.11)

## 2021-10-31 DIAGNOSIS — S31809A Unspecified open wound of unspecified buttock, initial encounter: Secondary | ICD-10-CM | POA: Diagnosis not present

## 2021-11-02 DIAGNOSIS — R2689 Other abnormalities of gait and mobility: Secondary | ICD-10-CM | POA: Diagnosis not present

## 2021-11-02 DIAGNOSIS — G20 Parkinson's disease: Secondary | ICD-10-CM | POA: Diagnosis not present

## 2021-11-02 DIAGNOSIS — R296 Repeated falls: Secondary | ICD-10-CM | POA: Diagnosis not present

## 2021-11-02 DIAGNOSIS — F39 Unspecified mood [affective] disorder: Secondary | ICD-10-CM | POA: Diagnosis not present

## 2021-11-02 DIAGNOSIS — R278 Other lack of coordination: Secondary | ICD-10-CM | POA: Diagnosis not present

## 2021-11-02 DIAGNOSIS — R2681 Unsteadiness on feet: Secondary | ICD-10-CM | POA: Diagnosis not present

## 2021-11-06 DIAGNOSIS — R296 Repeated falls: Secondary | ICD-10-CM | POA: Diagnosis not present

## 2021-11-06 DIAGNOSIS — F39 Unspecified mood [affective] disorder: Secondary | ICD-10-CM | POA: Diagnosis not present

## 2021-11-06 DIAGNOSIS — R2681 Unsteadiness on feet: Secondary | ICD-10-CM | POA: Diagnosis not present

## 2021-11-06 DIAGNOSIS — G20 Parkinson's disease: Secondary | ICD-10-CM | POA: Diagnosis not present

## 2021-11-06 DIAGNOSIS — R2689 Other abnormalities of gait and mobility: Secondary | ICD-10-CM | POA: Diagnosis not present

## 2021-11-06 DIAGNOSIS — R278 Other lack of coordination: Secondary | ICD-10-CM | POA: Diagnosis not present

## 2021-11-07 DIAGNOSIS — J029 Acute pharyngitis, unspecified: Secondary | ICD-10-CM | POA: Diagnosis not present

## 2021-11-08 DIAGNOSIS — R278 Other lack of coordination: Secondary | ICD-10-CM | POA: Diagnosis not present

## 2021-11-08 DIAGNOSIS — F39 Unspecified mood [affective] disorder: Secondary | ICD-10-CM | POA: Diagnosis not present

## 2021-11-08 DIAGNOSIS — R2681 Unsteadiness on feet: Secondary | ICD-10-CM | POA: Diagnosis not present

## 2021-11-08 DIAGNOSIS — R2689 Other abnormalities of gait and mobility: Secondary | ICD-10-CM | POA: Diagnosis not present

## 2021-11-08 DIAGNOSIS — R296 Repeated falls: Secondary | ICD-10-CM | POA: Diagnosis not present

## 2021-11-08 DIAGNOSIS — G20 Parkinson's disease: Secondary | ICD-10-CM | POA: Diagnosis not present

## 2021-11-13 DIAGNOSIS — F39 Unspecified mood [affective] disorder: Secondary | ICD-10-CM | POA: Diagnosis not present

## 2021-11-13 DIAGNOSIS — R278 Other lack of coordination: Secondary | ICD-10-CM | POA: Diagnosis not present

## 2021-11-13 DIAGNOSIS — R2689 Other abnormalities of gait and mobility: Secondary | ICD-10-CM | POA: Diagnosis not present

## 2021-11-13 DIAGNOSIS — R2681 Unsteadiness on feet: Secondary | ICD-10-CM | POA: Diagnosis not present

## 2021-11-13 DIAGNOSIS — G20 Parkinson's disease: Secondary | ICD-10-CM | POA: Diagnosis not present

## 2021-11-13 DIAGNOSIS — R296 Repeated falls: Secondary | ICD-10-CM | POA: Diagnosis not present

## 2021-12-22 ENCOUNTER — Non-Acute Institutional Stay (SKILLED_NURSING_FACILITY): Payer: Self-pay | Admitting: Student

## 2021-12-22 DIAGNOSIS — M7022 Olecranon bursitis, left elbow: Secondary | ICD-10-CM

## 2021-12-22 DIAGNOSIS — G4752 REM sleep behavior disorder: Secondary | ICD-10-CM

## 2021-12-22 DIAGNOSIS — I10 Essential (primary) hypertension: Secondary | ICD-10-CM

## 2021-12-22 DIAGNOSIS — Z8781 Personal history of (healed) traumatic fracture: Secondary | ICD-10-CM

## 2021-12-22 DIAGNOSIS — K219 Gastro-esophageal reflux disease without esophagitis: Secondary | ICD-10-CM

## 2021-12-22 DIAGNOSIS — F0280 Dementia in other diseases classified elsewhere without behavioral disturbance: Secondary | ICD-10-CM

## 2021-12-22 DIAGNOSIS — G20 Parkinson's disease: Secondary | ICD-10-CM

## 2021-12-22 DIAGNOSIS — R296 Repeated falls: Secondary | ICD-10-CM

## 2021-12-22 DIAGNOSIS — N39498 Other specified urinary incontinence: Secondary | ICD-10-CM

## 2021-12-22 DIAGNOSIS — R441 Visual hallucinations: Secondary | ICD-10-CM

## 2021-12-22 NOTE — Progress Notes (Unsigned)
Location:   [facility]) Nursing Home Room Number: 841 YSAYT of Service:  Nursing [phone]) Provider:  Amada Kingfisher, MD  Patient Care Team: Dewayne Shorter, MD as PCP - General (Family Medicine) [name], MD as PCP - Cardiology (Cardiology)  Extended Emergency Contact Information Primary Emergency Contact: [name] [address] [name] of [place] Phone: [phone] Mobile Phone: [phone] Relation: Niece Secondary Emergency Contact: Couch,[name] Address: [address]          [place], [place] [address] [name] of [place] Phone: [phone] Mobile Phone: [phone] Relation: Relative  Code Status:  DNAR Goals of care: Advanced Directive information    06/29/2021    9:20 AM  Advanced Directives  Does Patient Have a Medical Advance Directive? Yes  Type of Advance Directive Out of facility DNR (pink MOST or yellow form)  Does patient want to make changes to medical advance directive? No - Patient declined  Pre-existing out of facility DNR order (yellow form or pink MOST form) Yellow form placed in chart (order not valid for inpatient use)     Chief Complaint  Patient presents with  . Medical Management of Chronic Issues    HPI:  Pt is a 71 y.o. male seen today for an acute visit for routine visit and follow up evaluation of falls and elbow pain.   Circumstances of the fall - patient was sedate in his wheelchair and slid out of the chair falling to the floor.   Discussed with nurse last week that patient has had 18 falls in the last year and they have been trouble shooting how to address his fall risk. Per discussion with nursing staff, patient has about the same activity level as at the lower dose.   Called his niece to discuss care plan. She lives in [place]. Cheryl Investment banker, corporate) has been her eyes and ears that keeps him informed.  She asked about a wheelchair about 6 months ago. He has  always been unsteady on his feet and everything he tells you is the truth just unclear if things are from yesterday or 30 years ago. He will say he was driving the other day and it was 5 years ago. He has a history of delirium and he will say things that are hard to communicate. She says he unplugs his phone and can't communicate as well anymore. "Every day is a different day for him." One day he could be almost comatose-- he looks asleep but hears what is going on but won't move. He will have a few days of sleeping and then a few days awake. He has had that since before when he was in [place]. He can hear everything. Panicking doesn't help. His vitals will be fine. Sometimes it's the schedule more than the dose. He is not eligible for a pump as a Retail banker patient.    She is his niece by his mom. Was married and is widowed. No children.   Past Medical History:  Diagnosis Date  . Abdominal hernia   . Basal cell carcinoma 06/30/2017   R mid back parapsinal  . 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[place])   . Essential hypertension, benign   . GERD (gastroesophageal reflux disease)   . Hyperlipidemia   . Parkinson disease ([place])   . Squamous cell carcinoma of skin 04/22/2017   mid vertex scalp  . Squamous cell carcinoma of skin 06/05/2019  crown scalp  . Urinary incontinence    Past Surgical History:  Procedure Laterality Date  . HIP ARTHROPLASTY Left 08/06/2017   Procedure: ARTHROPLASTY BIPOLAR HIP (HEMIARTHROPLASTY);  Surgeon: Corky Mull, MD;  Location: [facility] ORS;  Service: Orthopedics;  Laterality: Left;  . INGUINAL HERNIA REPAIR Bilateral   . VASECTOMY      Allergies  Allergen Reactions  . Lorazepam Other (See Comments)    confusion  . Sulfa Antibiotics Hives    Outpatient Encounter Medications as of 12/22/2021  Medication Sig  . carbidopa-levodopa (SINEMET CR) 50-200 MG tablet Take  Sinemet 50/200 mg one tablet every two hours ( 6am, 8am, 10am, 12pm, 2pm, 4pm 6pm,  8pm)  . amoxicillin (AMOXIL) 500 MG capsule Take 2,000 mg by mouth as directed. Before Dental Procedures  . docusate sodium (COLACE) 100 MG capsule Take 200 mg by mouth at bedtime.  . donepezil (ARICEPT) 10 MG tablet Take 10 mg by mouth every evening.  (Patient not taking: Reported on 05/22/2021)  . escitalopram (LEXAPRO) 10 MG tablet Take 10 mg by mouth daily.  Marland Kitchen guaiFENesin-dextromethorphan (ROBITUSSIN DM) 100-10 MG/5ML syrup Take 5 mLs by mouth every 4 (four) hours as needed for cough.  . hydrocortisone 2.5 % lotion Apply topically 2 (two) times daily.  . Lidocaine 4 % PTCH Apply 1 patch topically daily as needed for pain.  . meloxicam (MOBIC) 15 MG tablet Take 15 mg by mouth daily as needed for pain.  Marland Kitchen OLANZapine (ZYPREXA) 5 MG tablet Take 5 mg by mouth at bedtime.  . pantoprazole (PROTONIX) 40 MG tablet Take 40 mg by mouth 2 (two) times daily.  . polyethylene glycol powder (GLYCOLAX/MIRALAX) 17 GM/SCOOP powder Take 17 g by mouth daily as needed for constipation.  . senna-docusate (SENOKOT-S) 8.6-50 MG tablet Take 1 tablet by mouth 2 (two) times daily.  Marland Kitchen tiZANidine (ZANAFLEX) 2 MG tablet Take 2 mg by mouth every 8 (eight) hours as needed for muscle spasms.  . traMADol (ULTRAM) 50 MG tablet Take 50 mg by mouth 2 (two) times daily as needed for pain.  . [DISCONTINUED] carbidopa-levodopa (SINEMET IR) 25-100 MG tablet Take 3-4 tablets by mouth every 2 (two) hours. 4 AM, 6 AM, 8 AM, 10 AM, NOON, 2 PM, 4 PM, 6 PM, (8 PM 3 TABLETS)  . [DISCONTINUED] hydrocortisone 2.5 % cream Apply 1 application topically daily at 12 noon.   No facility-administered encounter medications on file as of 12/22/2021.    Review of Systems  Unable to perform ROS: Dementia   Immunization History  Administered Date(s) Administered  . Tdap 06/29/2021   Pertinent  Health Maintenance Due  Topic Date Due  . COLONOSCOPY (Pts 45-23yr Insurance coverage will need to be confirmed)  Never done  . INFLUENZA VACCINE   10/28/2021      05/24/2021    8:35 AM 05/25/2021    2:52 AM 05/25/2021    8:20 AM 06/28/2021    5:28 PM 06/29/2021    9:20 AM  Fall Risk  Patient Fall Risk Level High fall risk High fall risk High fall risk High fall risk High fall risk   Functional Status Survey:    Vitals:   12/22/21 1006  BP: 111/68  Pulse: 76  Resp: 16  Temp: 97.8 F (36.6 C)  SpO2: 97%  Weight: 156 lb (70.8 kg)   Body mass index is 23.04 kg/m. Physical Exam Cardiovascular:     Rate and Rhythm: Normal rate.  Pulmonary:     Effort: Pulmonary effort  is normal.     Breath sounds: Normal breath sounds.  Abdominal:     General: Bowel sounds are normal.     Palpations: Abdomen is soft.  Musculoskeletal:     Comments: Left arm with lateral olecranon effusion. No warmth or redness. Tenderness to the touch.   Neurological:     Mental Status: He is alert. Mental status is at baseline.   Labs reviewed: Recent Labs    05/22/21 0612 05/23/21 0514 06/28/21 1726 06/29/21 0924  NA 138 141 141 140  K 4.4 4.6 4.2 3.7  CL 107 108 106 103  CO2 '23 24 26 27  '$ GLUCOSE 111* 104* 145* 122*  BUN 17 14 25* 27*  CREATININE 1.17 0.85 1.44* 1.28*  CALCIUM 7.6* 8.2* 9.2 9.1  MG 1.9  --   --   --   PHOS 3.2  --   --   --    Recent Labs    05/21/21 2209 06/29/21 0924  AST 15 19  ALT <5 <5  ALKPHOS 61 77  BILITOT 1.5* 1.6*  PROT 6.7 7.5  ALBUMIN 3.8 4.4   Recent Labs    05/22/21 0735 06/28/21 1726 06/29/21 0924  WBC 7.3 14.7* 14.5*  NEUTROABS 6.2 13.3* 12.4*  HGB 12.6* 14.7 13.8  HCT 38.8* 45.6 42.8  MCV 91.1 89.4 89.5  PLT 125* 247 186   No results found for: "TSH" No results found for: "HGBA1C" Lab Results  Component Value Date   CHOL 173 05/15/2017   HDL 37 (L) 05/15/2017   LDLCALC 119 (H) 05/15/2017   TRIG 85 05/15/2017   CHOLHDL 4.7 05/15/2017    Significant Diagnostic Results in last 30 days:  No results found.  Assessment/Plan 1. Dementia associated with Parkinson's disease  [facility]) Patient with significant symptoms of parkinson's and dementia. 10/2021 had increase in carbidopa-levodopa to 50-200 mg tablet. Patient is often very sleepy for interactions, however, his periods of sleepiness have been persistent regardless of changes in his dosing of medications. Joint movement   2. Essential hypertension, benign BP well controlled on current regimen. Continue without medications at this time.   3. Gastroesophageal reflux disease without esophagitis Symptoms controlled with Protonix 40 mg daily. Consider dose reduction at follow up visit.   4. REM behavioral disorder Patient has slept during the day and night. Will trial dose reduction of medications. Previously had gabapentin discontinued. Continue Traz  5. Other urinary incontinence  BPH Supportive underwear continued. Urinating well without treatment at this time. Continue to monitor.   6. Frequent falls Dose reduction in celexa. Discontinue tizanidine. Decrease zyprexa dosing to 2.'5mg'$ .   7. Olecranon bursitis of left elbow Patient's exam consistent with bursitis of the elbow. Will plan for meloxicam scheduled for the next 7 days then discontinue.   8. Hallucinations, visual Decreased with xyprexa on board. Will trial decreased dose of xyprexa to 2.5 mg.   9. Hx of hip replacement Continues to have residual back pain. Will keep tramadol 50 mg nightly PRN. Consider dose reduction or discontinuation in the future.    Family/ staff Communication: Niece Advertising account executive and nursing staff, clinical coordinators.   Labs/tests ordered:  none  Tomasa Rand, MD, [facility] [phone]

## 2021-12-25 ENCOUNTER — Encounter: Payer: Self-pay | Admitting: Student

## 2021-12-31 DIAGNOSIS — R441 Visual hallucinations: Secondary | ICD-10-CM | POA: Diagnosis not present

## 2021-12-31 DIAGNOSIS — R251 Tremor, unspecified: Secondary | ICD-10-CM | POA: Diagnosis not present

## 2021-12-31 DIAGNOSIS — R413 Other amnesia: Secondary | ICD-10-CM | POA: Diagnosis not present

## 2021-12-31 DIAGNOSIS — G4711 Idiopathic hypersomnia with long sleep time: Secondary | ICD-10-CM | POA: Diagnosis not present

## 2021-12-31 DIAGNOSIS — F0280 Dementia in other diseases classified elsewhere without behavioral disturbance: Secondary | ICD-10-CM | POA: Diagnosis not present

## 2021-12-31 DIAGNOSIS — R42 Dizziness and giddiness: Secondary | ICD-10-CM | POA: Diagnosis not present

## 2021-12-31 DIAGNOSIS — G4752 REM sleep behavior disorder: Secondary | ICD-10-CM | POA: Diagnosis not present

## 2021-12-31 DIAGNOSIS — G20A1 Parkinson's disease without dyskinesia, without mention of fluctuations: Secondary | ICD-10-CM | POA: Diagnosis not present

## 2022-01-06 ENCOUNTER — Encounter: Payer: Self-pay | Admitting: Nurse Practitioner

## 2022-01-06 ENCOUNTER — Non-Acute Institutional Stay (SKILLED_NURSING_FACILITY): Payer: Medicare Other | Admitting: Nurse Practitioner

## 2022-01-06 DIAGNOSIS — M25522 Pain in left elbow: Secondary | ICD-10-CM | POA: Diagnosis not present

## 2022-01-06 DIAGNOSIS — M25521 Pain in right elbow: Secondary | ICD-10-CM | POA: Diagnosis not present

## 2022-01-06 NOTE — Progress Notes (Unsigned)
Location:  Other [facility]) Nursing Home Room Number: 419-A Place of Service:  SNF [phone]) Provider:  Carlos American. Dewaine Oats, NP   Patient Care Team: Dewayne Shorter, MD as PCP - General (Family Medicine) [name], MD as PCP - Cardiology (Cardiology)  Extended Emergency Contact Information Primary Emergency Contact: [name] [address] [name] of [place] Phone: [phone] Mobile Phone: [phone] Relation: Niece Secondary Emergency Contact: Couch,[name] Address: [address].          [place], [place] [address] [name] of [place] Phone: [phone] Mobile Phone: [phone] Relation: Relative  Code Status:  DNR Goals of care: Advanced Directive information    06/29/2021    9:20 AM  Advanced Directives  Does Patient Have a Medical Advance Directive? Yes  Type of Advance Directive Out of facility DNR (pink MOST or yellow form)  Does patient want to make changes to medical advance directive? No - Patient declined  Pre-existing out of facility DNR order (yellow form or pink MOST form) Yellow form placed in chart (order not valid for inpatient use)     Chief Complaint  Patient presents with   Acute Visit    Left elbow pain. Vitals and medications are a reflection of [facility]MR system, [employer]. [facility] utilized to verify immunizations.      HPI:  Pt is a 71 y.o. male seen today for an acute visit for left elbow pain and swelling. Dr Unk Lightning saw pt on 9/25 and he had swelling and pain. He was placed on meloxicam for 7 days which help but now swelling and pain has returned. Pt also notes pain and swelling of right elbow as well. Unsure how long this has been going on for.    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[place])    Essential hypertension, benign    GERD  (gastroesophageal reflux disease)    Hyperlipidemia    Parkinson disease    Squamous cell carcinoma of skin 04/22/2017   mid vertex scalp   Squamous cell carcinoma of skin 06/05/2019   crown scalp   Urinary incontinence    Past Surgical History:  Procedure Laterality Date   HIP ARTHROPLASTY Left 08/06/2017   Procedure: ARTHROPLASTY BIPOLAR HIP (HEMIARTHROPLASTY);  Surgeon: Corky Mull, MD;  Location: [facility] ORS;  Service: Orthopedics;  Laterality: Left;   INGUINAL HERNIA REPAIR Bilateral    VASECTOMY      Allergies  Allergen Reactions   Lorazepam Other (See Comments)    confusion   Sulfa Antibiotics Hives    Outpatient Encounter Medications as of 01/06/2022  Medication Sig   amoxicillin (AMOXIL) 500 MG capsule Take 2,000 mg by mouth as directed. Before Dental Procedures   carbidopa-levodopa (SINEMET CR) 50-200 MG tablet Take  Sinemet 50/200 mg one tablet every two hours ( 6am, 8am, 10am, 12pm, 2pm, 4pm 6pm, 8pm)   docusate sodium (COLACE) 100 MG capsule Take 100 mg by mouth 2 (two) times daily.   escitalopram (LEXAPRO) 5 MG tablet Take 5 mg by mouth daily.   guaiFENesin-dextromethorphan (ROBITUSSIN DM) 100-10 MG/5ML syrup Take 5 mLs by mouth every 4 (four) hours as needed for cough.   hydrocortisone 2.5 % lotion Apply 1 Application topically daily.   Lidocaine 4 % PTCH Apply 1 patch topically daily as  needed for pain.   Menthol-Zinc Oxide (CALMOSEPTINE) 0.44-20.6 % OINT Apply 1 Application topically daily. As needed to buttocks for irritation   OLANZapine (ZYPREXA) 2.5 MG tablet Take 2.5 mg by mouth at bedtime.   pantoprazole (PROTONIX) 40 MG tablet Take 40 mg by mouth 2 (two) times daily.   polyethylene glycol powder (GLYCOLAX/MIRALAX) 17 GM/SCOOP powder Take 17 g by mouth daily as needed for constipation.   senna-docusate (SENOKOT-S) 8.6-50 MG tablet Take 1 tablet by mouth 2 (two) times daily.   traMADol (ULTRAM) 50 MG tablet Take 50 mg by mouth 2 (two) times daily as needed  for pain.   [DISCONTINUED] donepezil (ARICEPT) 10 MG tablet Take 10 mg by mouth every evening.  (Patient not taking: Reported on 05/22/2021)   [DISCONTINUED] escitalopram (LEXAPRO) 10 MG tablet Take 5 mg by mouth daily.   [DISCONTINUED] meloxicam (MOBIC) 15 MG tablet Take 15 mg by mouth daily as needed for pain.   [DISCONTINUED] OLANZapine (ZYPREXA) 5 MG tablet Take 2.5 mg by mouth at bedtime.   No facility-administered encounter medications on file as of 01/06/2022.    Review of Systems  Constitutional:  Negative for activity change, appetite change, chills and fever.  Musculoskeletal:  Positive for arthralgias and joint swelling.    Immunization History  Administered Date(s) Administered   Moderna Covid-19 Vaccine Bivalent Booster 22yr & up 08/26/2021   Moderna SARS-COV2 Booster Vaccination 08/16/2020   Moderna Sars-Covid-2 Vaccination 04/10/2019, 05/08/2019   Pfizer Covid-19 Vaccine Bivalent Booster 171yr& up 12/20/2020   Pneumococcal Polysaccharide-23 03/30/2008   Tdap 06/09/2013, 06/29/2021   Zoster, Live 10/18/2011   Pertinent  Health Maintenance Due  Topic Date Due   COLONOSCOPY (Pts 45-4956yrnsurance coverage will need to be confirmed)  Never done   INFLUENZA VACCINE  10/28/2021      05/24/2021    8:35 AM 05/25/2021    2:52 AM 05/25/2021    8:20 AM 06/28/2021    5:28 PM 06/29/2021    9:20 AM  Fall Risk  Patient Fall Risk Level High fall risk High fall risk High fall risk High fall risk High fall risk   Functional Status Survey:    Vitals:   01/06/22 1450  BP: 128/64  Pulse: 96  Resp: 18  Temp: (!) 97.1 F (36.2 C)  SpO2: 97%  Weight: 152 lb (68.9 kg)  Height: '5\' 9"'$  (1.753 m)   Body mass index is 22.45 kg/m. Physical Exam Constitutional:      Appearance: Normal appearance.  Musculoskeletal:     Right elbow: Swelling present. Tenderness present.     Left elbow: Swelling present. Tenderness present.  Neurological:     Mental Status: He is alert.      Labs reviewed: Recent Labs    05/22/21 0612 05/23/21 0514 06/28/21 1726 06/29/21 0924 07/28/21 0000 10/30/21 0000  NA 138 141 141 140 137 138  K 4.4 4.6 4.2 3.7 3.9 4.0  CL 107 108 106 103 100 104  CO2 '23 24 26 27 '$ 30* 27*  GLUCOSE 111* 104* 145* 122*  --   --   BUN 17 14 25* 27* 19 20  CREATININE 1.17 0.85 1.44* 1.28* 1.1 1.0  CALCIUM 7.6* 8.2* 9.2 9.1 9.4 9.2  MG 1.9  --   --   --   --   --   PHOS 3.2  --   --   --   --   --    Recent Labs    05/21/21 2209 06/29/21 [id number]  07/28/21 0000 10/30/21 0000  AST 15 19 10* 9*  ALT <5 <5  --  3*  ALKPHOS 61 77 99 78  BILITOT 1.5* 1.6*  --   --   PROT 6.7 7.5  --   --   ALBUMIN 3.8 4.4 4.4 4.2   Recent Labs    05/22/21 0735 06/28/21 1726 06/29/21 0924 07/28/21 0000 10/30/21 0000  WBC 7.3 14.7* 14.5* 5.9 5.6  NEUTROABS 6.2 13.3* 12.4* 4,289.00  --   HGB 12.6* 14.7 13.8 14.4 13.8  HCT 38.8* 45.6 42.8 43 41  MCV 91.1 89.4 89.5  --   --   PLT 125* 247 186 207 199   No results found for: "TSH" No results found for: "HGBA1C" Lab Results  Component Value Date   CHOL 173 05/15/2017   HDL 37 (L) 05/15/2017   LDLCALC 119 (H) 05/15/2017   TRIG 85 05/15/2017   CHOLHDL 4.7 05/15/2017    Significant Diagnostic Results in last 30 days:  No results found.  Assessment/Plan 1. Bilateral elbow joint pain With recurrent swelling and tenderness. Will refer to ortho for further evaluation- drainage and possible injection at this time.   Carlos American. [employer], [facility] [phone]

## 2022-01-09 DIAGNOSIS — M7021 Olecranon bursitis, right elbow: Secondary | ICD-10-CM | POA: Diagnosis not present

## 2022-01-09 DIAGNOSIS — M7022 Olecranon bursitis, left elbow: Secondary | ICD-10-CM | POA: Diagnosis not present

## 2022-01-10 ENCOUNTER — Telehealth: Payer: Self-pay | Admitting: Adult Health

## 2022-01-10 DIAGNOSIS — M79642 Pain in left hand: Secondary | ICD-10-CM | POA: Diagnosis not present

## 2022-01-10 DIAGNOSIS — M25551 Pain in right hip: Secondary | ICD-10-CM | POA: Diagnosis not present

## 2022-01-10 NOTE — Telephone Encounter (Signed)
Nurse called to report xray findings. Neg hip xray. Left hand xray revealed partial dislocation of the 4th PIP. Minimal pain at this time. Nurse will apply ice and provider can follow up on Monday.

## 2022-01-10 NOTE — Telephone Encounter (Signed)
Nurse called requesting an order for an xray to the resident's left hand and right hip. He had a fall and is reporting pain to both areas. He is able to bear weight to the right leg but having pain. Order given for xray.

## 2022-01-12 DIAGNOSIS — M7021 Olecranon bursitis, right elbow: Secondary | ICD-10-CM | POA: Diagnosis not present

## 2022-01-12 DIAGNOSIS — S63285A Dislocation of proximal interphalangeal joint of left ring finger, initial encounter: Secondary | ICD-10-CM | POA: Diagnosis not present

## 2022-01-12 DIAGNOSIS — M7022 Olecranon bursitis, left elbow: Secondary | ICD-10-CM | POA: Diagnosis not present

## 2022-01-12 NOTE — Telephone Encounter (Signed)
Reviewed images this morning. Thanks for the message and update.

## 2022-01-15 ENCOUNTER — Non-Acute Institutional Stay (SKILLED_NURSING_FACILITY): Payer: Medicare Other | Admitting: Nurse Practitioner

## 2022-01-15 ENCOUNTER — Encounter: Payer: Self-pay | Admitting: Nurse Practitioner

## 2022-01-15 DIAGNOSIS — L602 Onychogryphosis: Secondary | ICD-10-CM

## 2022-01-15 NOTE — Progress Notes (Signed)
Location:  Other [facility]) Nursing Home Room Number: 419-A Place of Service:  SNF [phone]) Provider:  Carlos American. Dewaine Oats, NP   Patient Care Team: Dewayne Shorter, MD as PCP - General (Family Medicine) [name], MD as PCP - Cardiology (Cardiology)  Extended Emergency Contact Information Primary Emergency Contact: [name] [address] [name] of [place] Phone: [phone] Mobile Phone: [phone] Relation: Niece Secondary Emergency Contact: Couch,[name] Address: [address].          [place], [place] [address] [name] of [place] Phone: [phone] Mobile Phone: [phone] Relation: Relative  Code Status:  DNR Goals of care: Advanced Directive information    01/15/2022    3:21 PM  Advanced Directives  Does Patient Have a Medical Advance Directive? Yes  Type of Paramedic of [place];Out of facility DNR (pink MOST or yellow form)  Does patient want to make changes to medical advance directive? No - Patient declined  Copy of [place] in Chart? No - copy requested  Pre-existing out of facility DNR order (yellow form or pink MOST form) Yellow form placed in chart (order not valid for inpatient use)     Chief Complaint  Patient presents with   Acute Visit    Painful toenails     HPI:  Pt is a 71 y.o. male seen today for an acute visit due to painful overgrown toenails. Staff requesting to be trimmed down.    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[place])    Essential hypertension, benign    GERD (gastroesophageal reflux disease)    Hyperlipidemia    Parkinson disease    Squamous cell carcinoma of skin 04/22/2017   mid vertex scalp   Squamous cell carcinoma of skin 06/05/2019   crown scalp   Urinary incontinence    Past  Surgical History:  Procedure Laterality Date   HIP ARTHROPLASTY Left 08/06/2017   Procedure: ARTHROPLASTY BIPOLAR HIP (HEMIARTHROPLASTY);  Surgeon: Corky Mull, MD;  Location: [facility] ORS;  Service: Orthopedics;  Laterality: Left;   INGUINAL HERNIA REPAIR Bilateral    VASECTOMY      Allergies  Allergen Reactions   Lorazepam Other (See Comments)    confusion   Sulfa Antibiotics Hives    Outpatient Encounter Medications as of 01/15/2022  Medication Sig   amoxicillin (AMOXIL) 500 MG capsule Take 2,000 mg by mouth as directed. Before Dental Procedures   carbidopa-levodopa (SINEMET CR) 50-200 MG tablet Take  Sinemet 50/200 mg one tablet every two hours ( 6am, 8am, 10am, 12pm, 2pm, 4pm 6pm, 8pm)   escitalopram (LEXAPRO) 5 MG tablet Take 5 mg by mouth daily.   guaiFENesin-dextromethorphan (ROBITUSSIN DM) 100-10 MG/5ML syrup Take 5 mLs by mouth every 4 (four) hours as needed for cough.   hydrocortisone 2.5 % lotion Apply 1 Application topically daily.   Lidocaine 4 % PTCH Apply 1 patch topically daily as needed for pain.   Menthol-Zinc Oxide (CALMOSEPTINE) 0.44-20.6 % OINT Apply 1 Application topically daily. As needed to buttocks for irritation   OLANZapine (ZYPREXA) 2.5 MG tablet Take 2.5 mg by mouth at bedtime.   pantoprazole (PROTONIX) 40 MG tablet Take 40 mg by mouth 2 (two) times daily.   polyethylene glycol powder (GLYCOLAX/MIRALAX) 17 GM/SCOOP powder Take 17 g by mouth  daily as needed for constipation.   senna-docusate (SENOKOT-S) 8.6-50 MG tablet Take 1 tablet by mouth 2 (two) times daily.   traMADol (ULTRAM) 50 MG tablet Take 50 mg by mouth 2 (two) times daily as needed for pain.   [DISCONTINUED] docusate sodium (COLACE) 100 MG capsule Take 100 mg by mouth 2 (two) times daily.   No facility-administered encounter medications on file as of 01/15/2022.    Review of Systems  Constitutional:  Negative for activity change and appetite change.  Skin:        Painful toenails     Immunization History  Administered Date(s) Administered   Moderna Covid-19 Vaccine Bivalent Booster 54yr & up 08/26/2021   Moderna SARS-COV2 Booster Vaccination 08/16/2020   Moderna Sars-Covid-2 Vaccination 04/10/2019, 05/08/2019   Pfizer Covid-19 Vaccine Bivalent Booster 150yr& up 12/20/2020   Pneumococcal Polysaccharide-23 03/30/2008   Tdap 06/09/2013, 06/29/2021   Zoster, Live 10/18/2011   Pertinent  Health Maintenance Due  Topic Date Due   COLONOSCOPY (Pts [phone] coverage will need to be confirmed)  Never done   INFLUENZA VACCINE  10/28/2021      05/24/2021    8:35 AM 05/25/2021    2:52 AM 05/25/2021    8:20 AM 06/28/2021    5:28 PM 06/29/2021    9:20 AM  Fall Risk  Patient Fall Risk Level High fall risk High fall risk High fall risk High fall risk High fall risk   Functional Status Survey:    Vitals:   01/15/22 1514  BP: 130/66  Pulse: 74  Temp: 97.9 F (36.6 C)  SpO2: 97%  Weight: 152 lb (68.9 kg)  Height: '5\' 9"'$  (1.753 m)   Body mass index is 22.45 kg/m. Physical Exam Constitutional:      Appearance: Normal appearance.  Feet:     Right foot:     Toenail Condition: Right toenails are abnormally thick and long. Fungal disease present.    Left foot:     Toenail Condition: Left toenails are abnormally thick and long. Fungal disease present. Neurological:     Mental Status: He is alert. Mental status is at baseline.  Psychiatric:        Mood and Affect: Mood normal.     Labs reviewed: Recent Labs    05/22/21 0612 05/23/21 0514 06/28/21 1726 06/29/21 0924 07/28/21 0000 10/30/21 0000  NA 138 141 141 140 137 138  K 4.4 4.6 4.2 3.7 3.9 4.0  CL 107 108 106 103 100 104  CO2 '23 24 26 27 '$ 30* 27*  GLUCOSE 111* 104* 145* 122*  --   --   BUN 17 14 25* 27* 19 20  CREATININE 1.17 0.85 1.44* 1.28* 1.1 1.0  CALCIUM 7.6* 8.2* 9.2 9.1 9.4 9.2  MG 1.9  --   --   --   --   --   PHOS 3.2  --   --   --   --   --    Recent Labs    05/21/21 2209  06/29/21 0924 07/28/21 0000 10/30/21 0000  AST 15 19 10* 9*  ALT <5 <5  --  3*  ALKPHOS 61 77 99 78  BILITOT 1.5* 1.6*  --   --   PROT 6.7 7.5  --   --   ALBUMIN 3.8 4.4 4.4 4.2   Recent Labs    05/22/21 0735 06/28/21 1726 06/29/21 0924 07/28/21 0000 10/30/21 0000  WBC 7.3 14.7* 14.5* 5.9 5.6  NEUTROABS 6.2 13.3* 12.4* 4,289.00  --  HGB 12.6* 14.7 13.8 14.4 13.8  HCT 38.8* 45.6 42.8 43 41  MCV 91.1 89.4 89.5  --   --   PLT 125* 247 186 207 199   No results found for: "TSH" No results found for: "HGBA1C" Lab Results  Component Value Date   CHOL 173 05/15/2017   HDL 37 (L) 05/15/2017   LDLCALC 119 (H) 05/15/2017   TRIG 85 05/15/2017   CHOLHDL 4.7 05/15/2017    Significant Diagnostic Results in last 30 days:  No results found.  Assessment/Plan 1. Overgrown toenails -Consistent provided for toe nails to be trimmed, Nails trimmed with clippers or use of dremel 10 without complication. Pt tolerated well and reported immediate relief of discomfort.   Carlos American. [place], [facility] [phone]

## 2022-01-26 DIAGNOSIS — R4189 Other symptoms and signs involving cognitive functions and awareness: Secondary | ICD-10-CM | POA: Diagnosis not present

## 2022-01-26 DIAGNOSIS — R278 Other lack of coordination: Secondary | ICD-10-CM | POA: Diagnosis not present

## 2022-01-26 DIAGNOSIS — Z9181 History of falling: Secondary | ICD-10-CM | POA: Diagnosis not present

## 2022-01-26 DIAGNOSIS — F0280 Dementia in other diseases classified elsewhere without behavioral disturbance: Secondary | ICD-10-CM | POA: Diagnosis not present

## 2022-01-26 DIAGNOSIS — Z741 Need for assistance with personal care: Secondary | ICD-10-CM | POA: Diagnosis not present

## 2022-01-26 DIAGNOSIS — R296 Repeated falls: Secondary | ICD-10-CM | POA: Diagnosis not present

## 2022-01-26 DIAGNOSIS — R2681 Unsteadiness on feet: Secondary | ICD-10-CM | POA: Diagnosis not present

## 2022-01-26 DIAGNOSIS — R2689 Other abnormalities of gait and mobility: Secondary | ICD-10-CM | POA: Diagnosis not present

## 2022-01-26 DIAGNOSIS — S63285A Dislocation of proximal interphalangeal joint of left ring finger, initial encounter: Secondary | ICD-10-CM | POA: Diagnosis not present

## 2022-01-26 DIAGNOSIS — M6281 Muscle weakness (generalized): Secondary | ICD-10-CM | POA: Diagnosis not present

## 2022-01-26 DIAGNOSIS — G20A1 Parkinson's disease without dyskinesia, without mention of fluctuations: Secondary | ICD-10-CM | POA: Diagnosis not present

## 2022-01-28 DIAGNOSIS — R4189 Other symptoms and signs involving cognitive functions and awareness: Secondary | ICD-10-CM | POA: Diagnosis not present

## 2022-01-28 DIAGNOSIS — R296 Repeated falls: Secondary | ICD-10-CM | POA: Diagnosis not present

## 2022-01-28 DIAGNOSIS — G20A1 Parkinson's disease without dyskinesia, without mention of fluctuations: Secondary | ICD-10-CM | POA: Diagnosis not present

## 2022-01-28 DIAGNOSIS — M6281 Muscle weakness (generalized): Secondary | ICD-10-CM | POA: Diagnosis not present

## 2022-01-28 DIAGNOSIS — R2689 Other abnormalities of gait and mobility: Secondary | ICD-10-CM | POA: Diagnosis not present

## 2022-01-28 DIAGNOSIS — F0280 Dementia in other diseases classified elsewhere without behavioral disturbance: Secondary | ICD-10-CM | POA: Diagnosis not present

## 2022-01-28 DIAGNOSIS — Z741 Need for assistance with personal care: Secondary | ICD-10-CM | POA: Diagnosis not present

## 2022-01-28 DIAGNOSIS — R278 Other lack of coordination: Secondary | ICD-10-CM | POA: Diagnosis not present

## 2022-01-28 DIAGNOSIS — Z9181 History of falling: Secondary | ICD-10-CM | POA: Diagnosis not present

## 2022-01-28 DIAGNOSIS — R2681 Unsteadiness on feet: Secondary | ICD-10-CM | POA: Diagnosis not present

## 2022-01-28 DIAGNOSIS — I10 Essential (primary) hypertension: Secondary | ICD-10-CM | POA: Diagnosis not present

## 2022-01-29 ENCOUNTER — Other Ambulatory Visit: Payer: Self-pay | Admitting: Nurse Practitioner

## 2022-01-29 NOTE — Progress Notes (Signed)
Potassium low at 3.3, no diarrhea or vomiting, no meds that would cause. Replaced with 40 meq KCL x1 and will repeat BMP in 2 weeks.

## 2022-02-03 DIAGNOSIS — F0280 Dementia in other diseases classified elsewhere without behavioral disturbance: Secondary | ICD-10-CM | POA: Diagnosis not present

## 2022-02-03 DIAGNOSIS — R296 Repeated falls: Secondary | ICD-10-CM | POA: Diagnosis not present

## 2022-02-03 DIAGNOSIS — Z9181 History of falling: Secondary | ICD-10-CM | POA: Diagnosis not present

## 2022-02-03 DIAGNOSIS — R2681 Unsteadiness on feet: Secondary | ICD-10-CM | POA: Diagnosis not present

## 2022-02-03 DIAGNOSIS — R278 Other lack of coordination: Secondary | ICD-10-CM | POA: Diagnosis not present

## 2022-02-03 DIAGNOSIS — G20A1 Parkinson's disease without dyskinesia, without mention of fluctuations: Secondary | ICD-10-CM | POA: Diagnosis not present

## 2022-02-05 ENCOUNTER — Encounter: Payer: Self-pay | Admitting: Nurse Practitioner

## 2022-02-05 ENCOUNTER — Non-Acute Institutional Stay (SKILLED_NURSING_FACILITY): Payer: Medicare Other | Admitting: Nurse Practitioner

## 2022-02-05 DIAGNOSIS — G20A1 Parkinson's disease without dyskinesia, without mention of fluctuations: Secondary | ICD-10-CM

## 2022-02-05 DIAGNOSIS — K5904 Chronic idiopathic constipation: Secondary | ICD-10-CM | POA: Diagnosis not present

## 2022-02-05 DIAGNOSIS — K219 Gastro-esophageal reflux disease without esophagitis: Secondary | ICD-10-CM | POA: Diagnosis not present

## 2022-02-05 DIAGNOSIS — G20B1 Parkinson's disease with dyskinesia, without mention of fluctuations: Secondary | ICD-10-CM

## 2022-02-05 DIAGNOSIS — F0280 Dementia in other diseases classified elsewhere without behavioral disturbance: Secondary | ICD-10-CM

## 2022-02-05 DIAGNOSIS — R2681 Unsteadiness on feet: Secondary | ICD-10-CM | POA: Diagnosis not present

## 2022-02-05 DIAGNOSIS — R296 Repeated falls: Secondary | ICD-10-CM | POA: Diagnosis not present

## 2022-02-05 DIAGNOSIS — I10 Essential (primary) hypertension: Secondary | ICD-10-CM | POA: Diagnosis not present

## 2022-02-05 DIAGNOSIS — Z9181 History of falling: Secondary | ICD-10-CM | POA: Diagnosis not present

## 2022-02-05 DIAGNOSIS — R278 Other lack of coordination: Secondary | ICD-10-CM | POA: Diagnosis not present

## 2022-02-05 NOTE — Progress Notes (Signed)
Location:  Other Nursing Home Room Number: [phone] KZSWF of Service:  SNF (31)  Lucas Shorter, MD  Patient Care Team: Lucas Shorter, MD as PCP - [place] (Family Medicine) [name], MD as PCP - Cardiology (Cardiology)  Extended Emergency Contact Information Primary Emergency [place]: [place] [address] Lucas Edwards of [place] Phone: [phone] Mobile Phone: [phone] Relation: Niece Secondary Emergency Contact: Couch,[name] Address: [address].          [place], [place] [address] Lucas Edwards of [place] Phone: [phone] Mobile Phone: [phone] Relation: Relative  Goals of care: Advanced Directive information    02/05/2022    3:53 PM  Advanced Directives  Does Patient Have a Medical Advance Directive? Yes  Type of Advance Directive Out of facility DNR (pink MOST or yellow form)  Pre-existing out of facility DNR order (yellow form or pink MOST form) Yellow form placed in chart (order not valid for inpatient use)     Chief Complaint  Patient presents with   Medical Management of Chronic Issues    Routine follow up   Immunizations    Shingrix vaccine, pneumonia vaccine, COVID booster and flu vaccine due   Quality Metric Gaps    Hep c screening,colonoscopy medicare annual wellness due    HPI:  Pt is a 71 y.o. male seen today for routine follow up. Pt with hx of parkinson disease, dementia, constipation.  2 small open areas to top of buttocks that staff is treating with dermacloud and monitoring frequently.  Ongoing constipation noted by staff No signs of worsening GERD He continues to have falls, interventions by staff in place.  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[place])    Essential hypertension, benign    GERD (gastroesophageal  reflux disease)    Hyperlipidemia    Parkinson disease    Squamous cell carcinoma of skin 04/22/2017   mid vertex scalp   Squamous cell carcinoma of skin 06/05/2019   crown scalp   Urinary incontinence    Past Surgical History:  Procedure Laterality Date   HIP ARTHROPLASTY Left 08/06/2017   Procedure: ARTHROPLASTY BIPOLAR HIP (HEMIARTHROPLASTY);  Surgeon: Corky Mull, MD;  Location: [facility] ORS;  Service: Orthopedics;  Laterality: Left;   INGUINAL HERNIA REPAIR Bilateral    VASECTOMY      Allergies  Allergen Reactions   Lorazepam Other (See Comments)    confusion   Sulfa Antibiotics Hives    Outpatient Encounter Medications as of 02/05/2022  Medication Sig   amoxicillin (AMOXIL) 500 MG capsule Take 2,000 mg by mouth as directed. Before Dental Procedures   carbidopa-levodopa (SINEMET CR) 50-200 MG tablet Take  Sinemet 50/200 mg one tablet every two hours ( 6am, 8am, 10am, 12pm, 2pm, 4pm 6pm, 8pm)   escitalopram (LEXAPRO) 5 MG tablet Take 5 mg by mouth daily.   guaiFENesin-dextromethorphan (ROBITUSSIN DM) 100-10 MG/5ML syrup Take 5 mLs by mouth every 4 (four) hours as needed for cough.   hydrocortisone 2.5 % lotion Apply 1 Application topically daily.   Lidocaine 4 % PTCH Apply 1 patch topically daily as needed for pain.   Menthol-Zinc Oxide (CALMOSEPTINE) 0.44-20.6 % OINT Apply 1 Application topically daily. As needed to buttocks for irritation   OLANZapine (ZYPREXA) 2.5 MG tablet Take 2.5  mg by mouth at bedtime.   pantoprazole (PROTONIX) 40 MG tablet Take 40 mg by mouth 2 (two) times daily.   polyethylene glycol powder (GLYCOLAX/MIRALAX) 17 GM/SCOOP powder Take 17 g by mouth daily as needed for constipation.   senna-docusate (SENOKOT-S) 8.6-50 MG tablet Take 1 tablet by mouth 2 (two) times daily.   traMADol (ULTRAM) 50 MG tablet Take 50 mg by mouth 2 (two) times daily as needed for pain.   No facility-administered encounter medications on file as of 02/05/2022.    Review of  Systems  Unable to perform ROS: Dementia    Immunization History  Administered Date(s) Administered   Moderna Covid-19 Vaccine Bivalent Booster 43yr & up 08/26/2021   Moderna SARS-COV2 Booster Vaccination 08/16/2020   Moderna Sars-Covid-2 Vaccination 04/10/2019, 05/08/2019   Pfizer Covid-19 Vaccine Bivalent Booster 164yr& up 12/20/2020   Pneumococcal Polysaccharide-23 03/30/2008, 03/31/2015   Tdap 06/09/2013, 06/29/2021   Zoster, Live 10/18/2011   Pertinent  Health Maintenance Due  Topic Date Due   COLONOSCOPY (Pts [phone] coverage will need to be confirmed)  Never done   INFLUENZA VACCINE  10/28/2021      05/24/2021    8:35 AM 05/25/2021    2:52 AM 05/25/2021    8:20 AM 06/28/2021    5:28 PM 06/29/2021    9:20 AM  Fall Risk  Patient Fall Risk Level High fall risk High fall risk High fall risk High fall risk High fall risk   Functional Status Survey:    Vitals:   02/05/22 1548  BP: (!) 179/84  Pulse: 92  Resp: 15  Temp: 98.2 F (36.8 C)  SpO2: 98%  Weight: 151 lb 3.2 oz (68.6 kg)  Height: '5\' 9"'$  (1.753 m)   Body mass index is 22.33 kg/m. Physical Exam Constitutional:      Edwards: He is not in acute distress.    Appearance: He is well-developed. He is not diaphoretic.  HENT:     Head: Normocephalic and atraumatic.     Right Ear: External ear normal.     Left Ear: External ear normal.     Mouth/Throat:     Pharynx: No oropharyngeal exudate.  Eyes:     Conjunctiva/sclera: Conjunctivae normal.     Pupils: Pupils are equal, round, and reactive to light.  Cardiovascular:     Rate and Rhythm: Normal rate and regular rhythm.     Heart sounds: Normal heart sounds.  Pulmonary:     Effort: Pulmonary effort is normal.     Breath sounds: Normal breath sounds.  Abdominal:     Edwards: Bowel sounds are normal.     Palpations: Abdomen is soft.  Musculoskeletal:        Edwards: No tenderness.     Cervical back: Normal range of motion and neck supple.      Right lower leg: No edema.     Left lower leg: No edema.  Skin:    Edwards: Skin is warm and dry.  Neurological:     Mental Status: He is alert. Mental status is at baseline.     Motor: Weakness present.     Gait: Gait abnormal.     Labs reviewed: Recent Labs    05/22/21 0612 05/23/21 0514 06/28/21 1726 06/29/21 0924 07/28/21 0000 10/30/21 0000  NA 138 141 141 140 137 138  K 4.4 4.6 4.2 3.7 3.9 4.0  CL 107 108 106 103 100 104  CO2 '23 24 26 27 '$ 30* 27*  GLUCOSE 111* 104*  145* 122*  --   --   BUN 17 14 25* 27* 19 20  CREATININE 1.17 0.85 1.44* 1.28* 1.1 1.0  CALCIUM 7.6* 8.2* 9.2 9.1 9.4 9.2  MG 1.9  --   --   --   --   --   PHOS 3.2  --   --   --   --   --    Recent Labs    05/21/21 2209 06/29/21 0924 07/28/21 0000 10/30/21 0000  AST 15 19 10* 9*  ALT <5 <5  --  3*  ALKPHOS 61 77 99 78  BILITOT 1.5* 1.6*  --   --   PROT 6.7 7.5  --   --   ALBUMIN 3.8 4.4 4.4 4.2   Recent Labs    05/22/21 0735 06/28/21 1726 06/29/21 0924 07/28/21 0000 10/30/21 0000  WBC 7.3 14.7* 14.5* 5.9 5.6  NEUTROABS 6.2 13.3* 12.4* 4,289.00  --   HGB 12.6* 14.7 13.8 14.4 13.8  HCT 38.8* 45.6 42.8 43 41  MCV 91.1 89.4 89.5  --   --   PLT 125* 247 186 207 199   No results found for: "TSH" No results found for: "HGBA1C" Lab Results  Component Value Date   CHOL 173 05/15/2017   HDL 37 (L) 05/15/2017   LDLCALC 119 (H) 05/15/2017   TRIG 85 05/15/2017   CHOLHDL 4.7 05/15/2017    Significant Diagnostic Results in last 30 days:  No results found.  Assessment/Plan 1. 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 Essential hypertension, benign -generally blood pressure is low, not on any hypertensive medication at this time, will continue to monitor   3. Gastroesophageal reflux disease without esophagitis Stable on protonix  4. Frequent falls Ongoing, very unsteady  gait, in [facility], staff providing close monitoring, fall precautions in place   5. Parkinson's disease with dyskinesia, unspecified whether manifestations fluctuate -ongoing, followed by neurologist, on sinemet 2 hours while awake  6. Chronic idiopathic constipation -ongoing, will have staff schedule miralax daily and monitor.   Carlos American. [place], [place] [facility] [phone]

## 2022-02-06 DIAGNOSIS — Z23 Encounter for immunization: Secondary | ICD-10-CM | POA: Diagnosis not present

## 2022-02-10 DIAGNOSIS — R296 Repeated falls: Secondary | ICD-10-CM | POA: Diagnosis not present

## 2022-02-10 DIAGNOSIS — R2681 Unsteadiness on feet: Secondary | ICD-10-CM | POA: Diagnosis not present

## 2022-02-10 DIAGNOSIS — G20A1 Parkinson's disease without dyskinesia, without mention of fluctuations: Secondary | ICD-10-CM | POA: Diagnosis not present

## 2022-02-10 DIAGNOSIS — F0280 Dementia in other diseases classified elsewhere without behavioral disturbance: Secondary | ICD-10-CM | POA: Diagnosis not present

## 2022-02-10 DIAGNOSIS — R278 Other lack of coordination: Secondary | ICD-10-CM | POA: Diagnosis not present

## 2022-02-10 DIAGNOSIS — Z9181 History of falling: Secondary | ICD-10-CM | POA: Diagnosis not present

## 2022-02-12 DIAGNOSIS — R2681 Unsteadiness on feet: Secondary | ICD-10-CM | POA: Diagnosis not present

## 2022-02-12 DIAGNOSIS — G20A1 Parkinson's disease without dyskinesia, without mention of fluctuations: Secondary | ICD-10-CM | POA: Diagnosis not present

## 2022-02-12 DIAGNOSIS — Z9181 History of falling: Secondary | ICD-10-CM | POA: Diagnosis not present

## 2022-02-12 DIAGNOSIS — R278 Other lack of coordination: Secondary | ICD-10-CM | POA: Diagnosis not present

## 2022-02-12 DIAGNOSIS — R296 Repeated falls: Secondary | ICD-10-CM | POA: Diagnosis not present

## 2022-02-12 DIAGNOSIS — I10 Essential (primary) hypertension: Secondary | ICD-10-CM | POA: Diagnosis not present

## 2022-02-12 DIAGNOSIS — F0280 Dementia in other diseases classified elsewhere without behavioral disturbance: Secondary | ICD-10-CM | POA: Diagnosis not present

## 2022-02-20 ENCOUNTER — Non-Acute Institutional Stay: Payer: Medicare Other | Admitting: Student

## 2022-02-20 DIAGNOSIS — F0280 Dementia in other diseases classified elsewhere without behavioral disturbance: Secondary | ICD-10-CM

## 2022-02-20 DIAGNOSIS — Z515 Encounter for palliative care: Secondary | ICD-10-CM | POA: Diagnosis not present

## 2022-02-20 DIAGNOSIS — R2681 Unsteadiness on feet: Secondary | ICD-10-CM | POA: Diagnosis not present

## 2022-02-20 DIAGNOSIS — Z9181 History of falling: Secondary | ICD-10-CM | POA: Diagnosis not present

## 2022-02-20 DIAGNOSIS — K5904 Chronic idiopathic constipation: Secondary | ICD-10-CM

## 2022-02-20 DIAGNOSIS — R296 Repeated falls: Secondary | ICD-10-CM | POA: Diagnosis not present

## 2022-02-20 DIAGNOSIS — R278 Other lack of coordination: Secondary | ICD-10-CM | POA: Diagnosis not present

## 2022-02-20 DIAGNOSIS — G20A1 Parkinson's disease without dyskinesia, without mention of fluctuations: Secondary | ICD-10-CM | POA: Diagnosis not present

## 2022-02-21 NOTE — Progress Notes (Signed)
[facility] Consult Note Telephone: [phone]  Fax: [phone]    Date of encounter: 02/20/22  PATIENT NAME: Lucas Edwards [address] [place] [address]   [phone] (home)  DOB: 29-Aug-1950 MRN: [id number] PRIMARY CARE PROVIDER:    Dewayne Shorter, MD,  [place] [address] [phone]  [place]:   Dewayne Shorter, [place],  [place] [address] [phone]  RESPONSIBLE PARTY:    Contact Information     Name Relation Home Work Mobile   Sicks,Stephanie Niece [phone]  [phone]   Couch,[name] Relative [phone]  [phone]   couch,charles Other   [phone]        I met face to face with patient in the facility. Palliative Care was asked to follow this patient by consultation request of  Dewayne Shorter, MD to address advance care planning and complex medical decision making. This is a follow up visit.                                   ASSESSMENT AND PLAN / RECOMMENDATIONS:   Advance Care Planning/Goals of Care: Goals include to maximize quality of life and symptom management. Patient/health care surrogate gave his/her permission to discuss. CODE STATUS: DNR  Education provided on Palliative Medicine; will continue to provide supportive care.  Symptom Management/Plan:  Parkinson's dementia-patient with functional decline. He is using w/c for locomotion. He has alternating periods where he will be awake or sleep for extended periods of time. No worsening hallucinations. Staff to assist with adl's, redirect/reorient as needed. Monitor for falls/safety. Continue sinemet, Lexapro and Zyprexa as directed.   Constipation-continue miralax daily and senna-S as directed.   Follow up Palliative Care Visit: Palliative care will continue to follow for complex medical decision making, advance care planning, and clarification of goals. Return 4-6 weeks or  prn.  I spent 15 minutes providing this consultation. More than 50% of the time in this consultation was spent in counseling and care coordination.  PPS: 40%  HOSPICE ELIGIBILITY/DIAGNOSIS: TBD  Chief Complaint: Palliative Medicine follow up visit.   HISTORY OF PRESENT ILLNESS:  Lucas Edwards is a 71 y.o. year old male  with Parkinson's disease, dementia, constipation, GERD, essential hypertension, REM behavioral disorder, muscle spasms, history of falls.  Patient resides at [facility]. Staff report patient is no longer ambulating since fall where he dislocated his finger. He does pedal self in w.c. Staff report patient had been awake the previous night and all throughout the day. He has periods where he is awake for extended periods of time, alternating with increased somnolence. Staff report patient with good appetite; no worsening dysphagia. Patient had miralax added in past 2 weeks d/t constipation.   Patient received resting in recliner; he does not arouse to verbal or tactile stimulation. PAINAD-0. HPI obtained per staff.  History obtained from review of EMR, discussion with primary team, and interview with family, facility staff/caregiver and/or Lucas Edwards.  I reviewed available labs, medications, imaging, studies and related documents from the EMR.  Records reviewed and summarized above.   ROS  Patient unable to contribute d/t somnolence  Physical Exam: Weight: 151 pounds Constitutional: NAD General: frail appearing, thin EYES: anicteric sclera, lids intact, no discharge  ENMT: intact hearing, oral mucous membranes moist, dentition intact CV: S1S2, RRR, no LE edema Pulmonary: LCTA, no increased work  of breathing, no cough, room air Abdomen:normo-active BS + 4 quadrants, soft and non tender, no ascites GU: deferred MSK: non-ambulatory Skin: warm and dry, no rashes or wounds on visible skin Neuro: + generalized weakness, somnolent Psych: non-anxious  affect Hem/lymph/immuno: no widespread bruising   Thank you for the opportunity to participate in the care of Lucas Edwards. Please call our office at [phone] if we can be of additional assistance.   Ezekiel Slocumb, NP   COVID-19 PATIENT SCREENING TOOL Asked and negative response unless otherwise noted:   Have you had symptoms of covid, tested positive or been in contact with someone with symptoms/positive test in the past 5-10 days? No

## 2022-02-26 DIAGNOSIS — R296 Repeated falls: Secondary | ICD-10-CM | POA: Diagnosis not present

## 2022-02-26 DIAGNOSIS — F0280 Dementia in other diseases classified elsewhere without behavioral disturbance: Secondary | ICD-10-CM | POA: Diagnosis not present

## 2022-02-26 DIAGNOSIS — R2681 Unsteadiness on feet: Secondary | ICD-10-CM | POA: Diagnosis not present

## 2022-02-26 DIAGNOSIS — Z9181 History of falling: Secondary | ICD-10-CM | POA: Diagnosis not present

## 2022-02-26 DIAGNOSIS — R278 Other lack of coordination: Secondary | ICD-10-CM | POA: Diagnosis not present

## 2022-02-26 DIAGNOSIS — G20A1 Parkinson's disease without dyskinesia, without mention of fluctuations: Secondary | ICD-10-CM | POA: Diagnosis not present

## 2022-03-06 DIAGNOSIS — R4189 Other symptoms and signs involving cognitive functions and awareness: Secondary | ICD-10-CM | POA: Diagnosis not present

## 2022-03-06 DIAGNOSIS — F0280 Dementia in other diseases classified elsewhere without behavioral disturbance: Secondary | ICD-10-CM | POA: Diagnosis not present

## 2022-03-06 DIAGNOSIS — Z741 Need for assistance with personal care: Secondary | ICD-10-CM | POA: Diagnosis not present

## 2022-03-06 DIAGNOSIS — R296 Repeated falls: Secondary | ICD-10-CM | POA: Diagnosis not present

## 2022-03-06 DIAGNOSIS — R2689 Other abnormalities of gait and mobility: Secondary | ICD-10-CM | POA: Diagnosis not present

## 2022-03-06 DIAGNOSIS — R2681 Unsteadiness on feet: Secondary | ICD-10-CM | POA: Diagnosis not present

## 2022-03-06 DIAGNOSIS — R278 Other lack of coordination: Secondary | ICD-10-CM | POA: Diagnosis not present

## 2022-03-06 DIAGNOSIS — Z9181 History of falling: Secondary | ICD-10-CM | POA: Diagnosis not present

## 2022-03-06 DIAGNOSIS — G20A1 Parkinson's disease without dyskinesia, without mention of fluctuations: Secondary | ICD-10-CM | POA: Diagnosis not present

## 2022-03-06 DIAGNOSIS — M6281 Muscle weakness (generalized): Secondary | ICD-10-CM | POA: Diagnosis not present

## 2022-03-10 DIAGNOSIS — R2681 Unsteadiness on feet: Secondary | ICD-10-CM | POA: Diagnosis not present

## 2022-03-10 DIAGNOSIS — G20A1 Parkinson's disease without dyskinesia, without mention of fluctuations: Secondary | ICD-10-CM | POA: Diagnosis not present

## 2022-03-10 DIAGNOSIS — R278 Other lack of coordination: Secondary | ICD-10-CM | POA: Diagnosis not present

## 2022-03-10 DIAGNOSIS — Z9181 History of falling: Secondary | ICD-10-CM | POA: Diagnosis not present

## 2022-03-10 DIAGNOSIS — R296 Repeated falls: Secondary | ICD-10-CM | POA: Diagnosis not present

## 2022-03-10 DIAGNOSIS — F0280 Dementia in other diseases classified elsewhere without behavioral disturbance: Secondary | ICD-10-CM | POA: Diagnosis not present

## 2022-03-12 DIAGNOSIS — F0280 Dementia in other diseases classified elsewhere without behavioral disturbance: Secondary | ICD-10-CM | POA: Diagnosis not present

## 2022-03-12 DIAGNOSIS — G20A1 Parkinson's disease without dyskinesia, without mention of fluctuations: Secondary | ICD-10-CM | POA: Diagnosis not present

## 2022-03-12 DIAGNOSIS — R296 Repeated falls: Secondary | ICD-10-CM | POA: Diagnosis not present

## 2022-03-12 DIAGNOSIS — Z9181 History of falling: Secondary | ICD-10-CM | POA: Diagnosis not present

## 2022-03-12 DIAGNOSIS — R2681 Unsteadiness on feet: Secondary | ICD-10-CM | POA: Diagnosis not present

## 2022-03-12 DIAGNOSIS — R278 Other lack of coordination: Secondary | ICD-10-CM | POA: Diagnosis not present

## 2022-03-17 DIAGNOSIS — R278 Other lack of coordination: Secondary | ICD-10-CM | POA: Diagnosis not present

## 2022-03-17 DIAGNOSIS — R2681 Unsteadiness on feet: Secondary | ICD-10-CM | POA: Diagnosis not present

## 2022-03-17 DIAGNOSIS — F0280 Dementia in other diseases classified elsewhere without behavioral disturbance: Secondary | ICD-10-CM | POA: Diagnosis not present

## 2022-03-17 DIAGNOSIS — G20A1 Parkinson's disease without dyskinesia, without mention of fluctuations: Secondary | ICD-10-CM | POA: Diagnosis not present

## 2022-03-17 DIAGNOSIS — Z9181 History of falling: Secondary | ICD-10-CM | POA: Diagnosis not present

## 2022-03-17 DIAGNOSIS — R296 Repeated falls: Secondary | ICD-10-CM | POA: Diagnosis not present

## 2022-03-19 DIAGNOSIS — R2681 Unsteadiness on feet: Secondary | ICD-10-CM | POA: Diagnosis not present

## 2022-03-19 DIAGNOSIS — R278 Other lack of coordination: Secondary | ICD-10-CM | POA: Diagnosis not present

## 2022-03-19 DIAGNOSIS — R296 Repeated falls: Secondary | ICD-10-CM | POA: Diagnosis not present

## 2022-03-19 DIAGNOSIS — G20A1 Parkinson's disease without dyskinesia, without mention of fluctuations: Secondary | ICD-10-CM | POA: Diagnosis not present

## 2022-03-19 DIAGNOSIS — F0280 Dementia in other diseases classified elsewhere without behavioral disturbance: Secondary | ICD-10-CM | POA: Diagnosis not present

## 2022-03-19 DIAGNOSIS — Z9181 History of falling: Secondary | ICD-10-CM | POA: Diagnosis not present

## 2022-03-26 DIAGNOSIS — M25551 Pain in right hip: Secondary | ICD-10-CM | POA: Diagnosis not present

## 2022-03-26 DIAGNOSIS — M25552 Pain in left hip: Secondary | ICD-10-CM | POA: Diagnosis not present

## 2022-03-26 DIAGNOSIS — M160 Bilateral primary osteoarthritis of hip: Secondary | ICD-10-CM | POA: Diagnosis not present

## 2022-03-31 DIAGNOSIS — F0280 Dementia in other diseases classified elsewhere without behavioral disturbance: Secondary | ICD-10-CM | POA: Diagnosis not present

## 2022-03-31 DIAGNOSIS — G20A1 Parkinson's disease without dyskinesia, without mention of fluctuations: Secondary | ICD-10-CM | POA: Diagnosis not present

## 2022-03-31 DIAGNOSIS — Z741 Need for assistance with personal care: Secondary | ICD-10-CM | POA: Diagnosis not present

## 2022-03-31 DIAGNOSIS — R296 Repeated falls: Secondary | ICD-10-CM | POA: Diagnosis not present

## 2022-03-31 DIAGNOSIS — R278 Other lack of coordination: Secondary | ICD-10-CM | POA: Diagnosis not present

## 2022-03-31 DIAGNOSIS — R4189 Other symptoms and signs involving cognitive functions and awareness: Secondary | ICD-10-CM | POA: Diagnosis not present

## 2022-03-31 DIAGNOSIS — Z9181 History of falling: Secondary | ICD-10-CM | POA: Diagnosis not present

## 2022-03-31 DIAGNOSIS — M6281 Muscle weakness (generalized): Secondary | ICD-10-CM | POA: Diagnosis not present

## 2022-03-31 DIAGNOSIS — R2689 Other abnormalities of gait and mobility: Secondary | ICD-10-CM | POA: Diagnosis not present

## 2022-03-31 DIAGNOSIS — R2681 Unsteadiness on feet: Secondary | ICD-10-CM | POA: Diagnosis not present

## 2022-04-02 ENCOUNTER — Encounter: Payer: Self-pay | Admitting: Nurse Practitioner

## 2022-04-02 ENCOUNTER — Non-Acute Institutional Stay (SKILLED_NURSING_FACILITY): Payer: Medicare Other | Admitting: Nurse Practitioner

## 2022-04-02 DIAGNOSIS — I10 Essential (primary) hypertension: Secondary | ICD-10-CM

## 2022-04-02 DIAGNOSIS — G20A1 Parkinson's disease without dyskinesia, without mention of fluctuations: Secondary | ICD-10-CM

## 2022-04-02 DIAGNOSIS — K5904 Chronic idiopathic constipation: Secondary | ICD-10-CM

## 2022-04-02 DIAGNOSIS — K219 Gastro-esophageal reflux disease without esophagitis: Secondary | ICD-10-CM

## 2022-04-02 DIAGNOSIS — R296 Repeated falls: Secondary | ICD-10-CM

## 2022-04-02 DIAGNOSIS — G20B1 Parkinson's disease with dyskinesia, without mention of fluctuations: Secondary | ICD-10-CM | POA: Diagnosis not present

## 2022-04-02 DIAGNOSIS — F0280 Dementia in other diseases classified elsewhere without behavioral disturbance: Secondary | ICD-10-CM

## 2022-04-02 NOTE — Progress Notes (Signed)
Location:  Other [place].  Nursing Home Room Number: [place]:  SNF [phone]) Provider:  Sherrie Mustache, NP   Patient Care Team: Dewayne Shorter, MD as PCP - General (Family Medicine) [name], MD as PCP - Cardiology (Cardiology)  Extended Emergency Contact Information Primary Emergency Contact: [name] [address] [name] of [place] Phone: [phone] Mobile Phone: [phone] Relation: Niece Secondary Emergency Contact: Couch,[name] Address: [address].          [place], [place] [address] [name] of [place] Phone: [phone] Mobile Phone: [phone] Relation: Relative  Code Status:  DNR Goals of care: Advanced Directive information    04/02/2022    9:40 AM  Advanced Directives  Does Patient Have a Medical Advance Directive? Yes  Type of Advance Directive Out of facility DNR (pink MOST or yellow form)  Does patient want to make changes to medical advance directive? No - Patient declined     Chief Complaint  Patient presents with   Medical Management of Chronic Issues    Medical Management of Chronic Issues.     HPI:  Pt is a 72 y.o. male seen today for medical management of chronic diseases.  Pt with parkinson dementia with significant decline in cognitive and functional status over the last several months. Continues to have falls but less frequent and staff providing increase supervision and fall precautions.  He has upcoming appt with neurologist.  Decrease oral intake and weight loss noted.  Pt does not appear to be in any pain.    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[place])    Essential hypertension, benign    GERD (gastroesophageal reflux disease)    Hyperlipidemia    Parkinson disease    Squamous cell carcinoma of skin  04/22/2017   mid vertex scalp   Squamous cell carcinoma of skin 06/05/2019   crown scalp   Urinary incontinence    Past Surgical History:  Procedure Laterality Date   HIP ARTHROPLASTY Left 08/06/2017   Procedure: ARTHROPLASTY BIPOLAR HIP (HEMIARTHROPLASTY);  Surgeon: Corky Mull, MD;  Location: [facility] ORS;  Service: Orthopedics;  Laterality: Left;   INGUINAL HERNIA REPAIR Bilateral    VASECTOMY      Allergies  Allergen Reactions   Gabapentin    Lorazepam Other (See Comments)    confusion   Sulfa Antibiotics Hives   Tylenol [Acetaminophen] Itching    Outpatient Encounter Medications as of 04/02/2022  Medication Sig   aluminum hydroxide (DERMAGRAN) ointment Apply three times daily as needed for redness to coccyx, buttock and peri area.   amoxicillin (AMOXIL) 500 MG capsule Take 2,000 mg by mouth as directed. Before Dental Procedures   carbidopa-levodopa (SINEMET CR) 50-200 MG tablet Take  Sinemet 50/200 mg one tablet every two hours ( 6am, 8am, 10am, 12pm, 2pm, 4pm 6pm, 8pm)   escitalopram (LEXAPRO) 5 MG tablet Take 5 mg by mouth daily.   guaiFENesin-dextromethorphan (ROBITUSSIN DM) 100-10 MG/5ML syrup Take 5 mLs by mouth every 4 (four) hours as needed for cough.   hydrocortisone 2.5 % lotion Apply 1 Application topically daily.   Lidocaine 4 % PTCH Apply 1 patch topically daily as needed for pain.   Menthol-Zinc Oxide (CALMOSEPTINE) 0.44-20.6 % OINT Apply 1 Application topically daily. As needed to buttocks  for irritation   Nutritional Supplements (ENSURE ENLIVE PO) Twice daily for wound healing.   OLANZapine (ZYPREXA) 2.5 MG tablet Take 2.5 mg by mouth at bedtime.   pantoprazole (PROTONIX) 40 MG tablet Take 40 mg by mouth 2 (two) times daily.   polyethylene glycol powder (GLYCOLAX/MIRALAX) 17 GM/SCOOP powder Take 17 g by mouth daily as needed for constipation.   senna-docusate (SENOKOT-S) 8.6-50 MG tablet Take 1 tablet by mouth 2 (two) times daily.   traMADol (ULTRAM) 50 MG tablet  Take 50 mg by mouth 2 (two) times daily as needed for pain.   No facility-administered encounter medications on file as of 04/02/2022.    Review of Systems  Unable to perform ROS: Dementia    Immunization History  Administered Date(s) Administered   Influenza-Unspecified 01/13/2022   Moderna Covid-19 Vaccine Bivalent Booster 36yr & up 08/26/2021, 02/06/2022   Moderna SARS-COV2 Booster Vaccination 08/16/2020   Moderna Sars-Covid-2 Vaccination 04/10/2019, 05/08/2019   Pfizer Covid-19 Vaccine Bivalent Booster 144yr& up 12/20/2020   Pneumococcal Polysaccharide-23 03/30/2008, 03/31/2015   Tdap 06/09/2013, 06/29/2021   Zoster Recombinat (Shingrix) 03/31/2015   Zoster, Live 10/18/2011   Pertinent  Health Maintenance Due  Topic Date Due   COLONOSCOPY (Pts 45-4955yrnsurance coverage will need to be confirmed)  Never done   INFLUENZA VACCINE  Completed      05/24/2021    8:35 AM 05/25/2021    2:52 AM 05/25/2021    8:20 AM 06/28/2021    5:28 PM 06/29/2021    9:20 AM  Fall Risk  Patient Fall Risk Level High fall risk High fall risk High fall risk High fall risk High fall risk   Functional Status Survey:    Vitals:   04/02/22 0919  BP: 120/66  Pulse: 92  Resp: 18  Temp: (!) 97.2 F (36.2 C)  SpO2: 98%  Weight: 130 lb 6.4 oz (59.1 kg)  Height: '5\' 9"'$  (1.753 m)   Body mass index is 19.26 kg/m. Physical Exam Constitutional:      General: He is not in acute distress.    Appearance: He is well-developed. He is not diaphoretic.  HENT:     Head: Normocephalic and atraumatic.     Right Ear: External ear normal.     Left Ear: External ear normal.     Nose: Nose normal.     Mouth/Throat:     Pharynx: No oropharyngeal exudate.  Eyes:     Conjunctiva/sclera: Conjunctivae normal.     Pupils: Pupils are equal, round, and reactive to light.  Cardiovascular:     Rate and Rhythm: Normal rate and regular rhythm.     Heart sounds: Normal heart sounds.  Pulmonary:     Effort: Pulmonary  effort is normal.     Breath sounds: Normal breath sounds.  Abdominal:     General: Bowel sounds are normal.     Palpations: Abdomen is soft.  Musculoskeletal:        General: No tenderness.     Cervical back: Normal range of motion and neck supple.     Right lower leg: No edema.     Left lower leg: No edema.  Skin:    General: Skin is warm and dry.  Neurological:     Mental Status: He is alert.     Motor: Weakness present.     Gait: Gait abnormal.     Labs reviewed: Recent Labs    05/22/21 0612 05/23/21 0514 06/28/21 1726 06/29/21 0924 07/28/21 0000 10/30/21 0000  NA 138 141 141 140 137 138  K 4.4 4.6 4.2 3.7 3.9 4.0  CL 107 108 106 103 100 104  CO2 '23 24 26 27 '$ 30* 27*  GLUCOSE 111* 104* 145* 122*  --   --   BUN 17 14 25* 27* 19 20  CREATININE 1.17 0.85 1.44* 1.28* 1.1 1.0  CALCIUM 7.6* 8.2* 9.2 9.1 9.4 9.2  MG 1.9  --   --   --   --   --   PHOS 3.2  --   --   --   --   --    Recent Labs    05/21/21 2209 06/29/21 0924 07/28/21 0000 10/30/21 0000  AST 15 19 10* 9*  ALT <5 <5  --  3*  ALKPHOS 61 77 99 78  BILITOT 1.5* 1.6*  --   --   PROT 6.7 7.5  --   --   ALBUMIN 3.8 4.4 4.4 4.2   Recent Labs    05/22/21 0735 06/28/21 1726 06/29/21 0924 07/28/21 0000 10/30/21 0000  WBC 7.3 14.7* 14.5* 5.9 5.6  NEUTROABS 6.2 13.3* 12.4* 4,289.00  --   HGB 12.6* 14.7 13.8 14.4 13.8  HCT 38.8* 45.6 42.8 43 41  MCV 91.1 89.4 89.5  --   --   PLT 125* 247 186 207 199   No results found for: "TSH" No results found for: "HGBA1C" Lab Results  Component Value Date   CHOL 173 05/15/2017   HDL 37 (L) 05/15/2017   LDLCALC 119 (H) 05/15/2017   TRIG 85 05/15/2017   CHOLHDL 4.7 05/15/2017    Significant Diagnostic Results in last 30 days:  No results found.  Assessment/Plan 1. 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   2. Essential hypertension, benign -Blood  pressure well controlled, goal bp <140/90 Continue current medications and dietary modifications follow metabolic panel  3. Frequent falls Ongoing continues fall precautions and increase supervision  4. Gastroesophageal reflux disease without esophagitis Well controlled on protonix  5. Parkinson's disease with dyskinesia, unspecified whether manifestations fluctuate Progressive decline, continues with follow up from neurology, continues on sinemet   6. Chronic idiopathic constipation -controlled on current regimen.      Carlos American. Foss, [facility] [phone]

## 2022-04-04 DIAGNOSIS — I10 Essential (primary) hypertension: Secondary | ICD-10-CM | POA: Diagnosis not present

## 2022-04-04 LAB — COMPREHENSIVE METABOLIC PANEL
Calcium: 9.6 (ref 8.7–10.7)
eGFR: 44

## 2022-04-04 LAB — BASIC METABOLIC PANEL
BUN: 20 (ref 4–21)
CO2: 30 — AB (ref 13–22)
Chloride: 105 (ref 99–108)
Creatinine: 1.7 — AB (ref 0.6–1.3)
Glucose: 128
Potassium: 4.4 mEq/L (ref 3.5–5.1)
Sodium: 142 (ref 137–147)

## 2022-04-05 ENCOUNTER — Telehealth: Payer: Self-pay | Admitting: Nurse Practitioner

## 2022-04-05 NOTE — Telephone Encounter (Signed)
04/04/21 staff called: the patient's oral intake has been poor, held food in mouth, irritable at times, lab draw was difficult. BMP showed creat 1.6s elevated from baseline 0.8 in Nov, Na 142 from 138. Unable to administer IVF D5 1/2 NS 75cc/hr x2042m, HPOA desired exhausting all means to hydrate him at facility before ED, recommended encouraging oral fluid 2464mq6hrs presently. Repeat BMP Monday.

## 2022-04-06 DIAGNOSIS — E860 Dehydration: Secondary | ICD-10-CM | POA: Diagnosis not present

## 2022-04-06 LAB — BASIC METABOLIC PANEL
BUN: 22 — AB (ref 4–21)
CO2: 29 — AB (ref 13–22)
Chloride: 101 (ref 99–108)
Creatinine: 0.8 (ref 0.6–1.3)
Glucose: 92
Potassium: 4 mEq/L (ref 3.5–5.1)
Sodium: 138 (ref 137–147)

## 2022-04-06 LAB — COMPREHENSIVE METABOLIC PANEL
Calcium: 9 (ref 8.7–10.7)
eGFR: 95

## 2022-04-07 DIAGNOSIS — R251 Tremor, unspecified: Secondary | ICD-10-CM | POA: Diagnosis not present

## 2022-04-07 DIAGNOSIS — G4711 Idiopathic hypersomnia with long sleep time: Secondary | ICD-10-CM | POA: Diagnosis not present

## 2022-04-07 DIAGNOSIS — R413 Other amnesia: Secondary | ICD-10-CM | POA: Diagnosis not present

## 2022-04-07 DIAGNOSIS — R42 Dizziness and giddiness: Secondary | ICD-10-CM | POA: Diagnosis not present

## 2022-04-07 DIAGNOSIS — G20A1 Parkinson's disease without dyskinesia, without mention of fluctuations: Secondary | ICD-10-CM | POA: Diagnosis not present

## 2022-04-07 DIAGNOSIS — R441 Visual hallucinations: Secondary | ICD-10-CM | POA: Diagnosis not present

## 2022-04-07 DIAGNOSIS — F0280 Dementia in other diseases classified elsewhere without behavioral disturbance: Secondary | ICD-10-CM | POA: Diagnosis not present

## 2022-04-07 DIAGNOSIS — G4752 REM sleep behavior disorder: Secondary | ICD-10-CM | POA: Diagnosis not present

## 2022-04-08 DIAGNOSIS — Z9181 History of falling: Secondary | ICD-10-CM | POA: Diagnosis not present

## 2022-04-08 DIAGNOSIS — G20A1 Parkinson's disease without dyskinesia, without mention of fluctuations: Secondary | ICD-10-CM | POA: Diagnosis not present

## 2022-04-08 DIAGNOSIS — R278 Other lack of coordination: Secondary | ICD-10-CM | POA: Diagnosis not present

## 2022-04-08 DIAGNOSIS — F0280 Dementia in other diseases classified elsewhere without behavioral disturbance: Secondary | ICD-10-CM | POA: Diagnosis not present

## 2022-04-08 DIAGNOSIS — R2681 Unsteadiness on feet: Secondary | ICD-10-CM | POA: Diagnosis not present

## 2022-04-08 DIAGNOSIS — R296 Repeated falls: Secondary | ICD-10-CM | POA: Diagnosis not present

## 2022-04-09 ENCOUNTER — Non-Acute Institutional Stay: Payer: Medicare Other | Admitting: Hospice

## 2022-04-09 DIAGNOSIS — Z515 Encounter for palliative care: Secondary | ICD-10-CM

## 2022-04-09 DIAGNOSIS — G20A1 Parkinson's disease without dyskinesia, without mention of fluctuations: Secondary | ICD-10-CM

## 2022-04-09 DIAGNOSIS — R451 Restlessness and agitation: Secondary | ICD-10-CM | POA: Diagnosis not present

## 2022-04-09 DIAGNOSIS — R2681 Unsteadiness on feet: Secondary | ICD-10-CM | POA: Diagnosis not present

## 2022-04-09 DIAGNOSIS — R441 Visual hallucinations: Secondary | ICD-10-CM | POA: Diagnosis not present

## 2022-04-09 DIAGNOSIS — F0280 Dementia in other diseases classified elsewhere without behavioral disturbance: Secondary | ICD-10-CM | POA: Diagnosis not present

## 2022-04-09 NOTE — Progress Notes (Signed)
    [facility] Consult Note Telephone: [phone]  Fax: [phone]    PATIENT NAME: Lucas Edwards [address] [place] [address]   [phone] (home)  DOB: 1950/09/02 MRN: [id number] PRIMARY CARE PROVIDER:    Dewayne Shorter, MD,  [place] [address] [phone]  [place]:   Dewayne Shorter, [place],  [place] [address] [phone]  RESPONSIBLE PARTY:    Contact Information     Name Relation Home Work Mobile   Sicks,Stephanie Niece [phone]  [phone]   Couch,[name] Relative [phone]  [phone]   couch,charles Other   [phone]        I met face to face with patient in the facility. Palliative Care was asked to follow this patient by consultation request of  Dewayne Shorter, MD to address advance care planning and complex medical decision making. This is a follow up visit.  NP called [name] and updated her on visit.  She expressed appreciation.                                   ASSESSMENT AND PLAN / RECOMMENDATIONS:   Advance Care Planning/Goals of Care: Goals include to maximize quality of life and symptom management.   CODE STATUS: DNR  Symptom Management/Plan: Dementia: Related to Parkinson disease.  Continue sinemet as ordered, neurologist consult as needed/planned. Gait instability: In the context of Parkinson's disease and dementia.  Patient is a high fall risk. Completed PT/OT, continue restorative exercises. Fall precautions.  Agitation/hallucination: Nursing reports agitation with occasional hallucination.  Visit with neurologist 04/07/2022 where Seroquel was initiated 50 mg nightly for sleep and hallucinations.  Use de-escalation techniques of redirection, calm approach and environmental modulation.  Follow-up with neurologist as planned.  Mood disorder: Managed with Lexapro  Follow up Palliative Care Visit: Palliative care will  continue to follow for complex medical decision making, advance care planning, and clarification of goals. Return 4-6 weeks or prn.  PPS: 40%  HOSPICE ELIGIBILITY/DIAGNOSIS: TBD  Chief Complaint: Palliative Medicine follow up visit.   HISTORY OF PRESENT ILLNESS:  Lucas Edwards is a 72 y.o. year old male  with multiple morbidities requiring close monitoring with high risk of complications and mortality: Parkinson's disease, dementia, agitation, hallucination, constipation, GERD, essential hypertension, REM behavioral disorder, muscle spasms, history of falls. History obtained from review of EMR, discussion with primary team, and interview with family, facility staff/caregiver and/or Lucas Edwards.  Rest of 10 point ROS asked and negative. I reviewed available labs, medications, imaging, studies and related documents from the EMR.  Records reviewed and summarized above.  I spent 35 minutes providing this consultation; this includes time spent with patient/family, chart review and documentation. More than 50% of the time in this consultation was spent on counseling and coordinating communication   Thank you for the opportunity to participate in the care of Mr. Chevez. Please call our office at [phone] if we can be of additional assistance.   Teodoro Spray, NP

## 2022-04-10 DIAGNOSIS — Z9181 History of falling: Secondary | ICD-10-CM | POA: Diagnosis not present

## 2022-04-10 DIAGNOSIS — R278 Other lack of coordination: Secondary | ICD-10-CM | POA: Diagnosis not present

## 2022-04-10 DIAGNOSIS — G20A1 Parkinson's disease without dyskinesia, without mention of fluctuations: Secondary | ICD-10-CM | POA: Diagnosis not present

## 2022-04-10 DIAGNOSIS — F0280 Dementia in other diseases classified elsewhere without behavioral disturbance: Secondary | ICD-10-CM | POA: Diagnosis not present

## 2022-04-10 DIAGNOSIS — R2681 Unsteadiness on feet: Secondary | ICD-10-CM | POA: Diagnosis not present

## 2022-04-10 DIAGNOSIS — R296 Repeated falls: Secondary | ICD-10-CM | POA: Diagnosis not present

## 2022-04-14 DIAGNOSIS — R2681 Unsteadiness on feet: Secondary | ICD-10-CM | POA: Diagnosis not present

## 2022-04-14 DIAGNOSIS — G20A1 Parkinson's disease without dyskinesia, without mention of fluctuations: Secondary | ICD-10-CM | POA: Diagnosis not present

## 2022-04-14 DIAGNOSIS — R296 Repeated falls: Secondary | ICD-10-CM | POA: Diagnosis not present

## 2022-04-14 DIAGNOSIS — F0280 Dementia in other diseases classified elsewhere without behavioral disturbance: Secondary | ICD-10-CM | POA: Diagnosis not present

## 2022-04-14 DIAGNOSIS — R278 Other lack of coordination: Secondary | ICD-10-CM | POA: Diagnosis not present

## 2022-04-14 DIAGNOSIS — Z9181 History of falling: Secondary | ICD-10-CM | POA: Diagnosis not present

## 2022-04-16 DIAGNOSIS — F0280 Dementia in other diseases classified elsewhere without behavioral disturbance: Secondary | ICD-10-CM | POA: Diagnosis not present

## 2022-04-16 DIAGNOSIS — G20A1 Parkinson's disease without dyskinesia, without mention of fluctuations: Secondary | ICD-10-CM | POA: Diagnosis not present

## 2022-04-16 DIAGNOSIS — R278 Other lack of coordination: Secondary | ICD-10-CM | POA: Diagnosis not present

## 2022-04-16 DIAGNOSIS — R296 Repeated falls: Secondary | ICD-10-CM | POA: Diagnosis not present

## 2022-04-16 DIAGNOSIS — R2681 Unsteadiness on feet: Secondary | ICD-10-CM | POA: Diagnosis not present

## 2022-04-16 DIAGNOSIS — Z9181 History of falling: Secondary | ICD-10-CM | POA: Diagnosis not present

## 2022-04-21 DIAGNOSIS — R296 Repeated falls: Secondary | ICD-10-CM | POA: Diagnosis not present

## 2022-04-21 DIAGNOSIS — F0280 Dementia in other diseases classified elsewhere without behavioral disturbance: Secondary | ICD-10-CM | POA: Diagnosis not present

## 2022-04-21 DIAGNOSIS — G20A1 Parkinson's disease without dyskinesia, without mention of fluctuations: Secondary | ICD-10-CM | POA: Diagnosis not present

## 2022-04-21 DIAGNOSIS — Z9181 History of falling: Secondary | ICD-10-CM | POA: Diagnosis not present

## 2022-04-21 DIAGNOSIS — R278 Other lack of coordination: Secondary | ICD-10-CM | POA: Diagnosis not present

## 2022-04-21 DIAGNOSIS — R2681 Unsteadiness on feet: Secondary | ICD-10-CM | POA: Diagnosis not present

## 2022-04-30 ENCOUNTER — Other Ambulatory Visit: Payer: Self-pay | Admitting: Nurse Practitioner

## 2022-04-30 DIAGNOSIS — G20A1 Parkinson's disease without dyskinesia, without mention of fluctuations: Secondary | ICD-10-CM | POA: Diagnosis not present

## 2022-04-30 MED ORDER — LORAZEPAM 1 MG PO TABS: 1.0000 mg | ORAL_TABLET | Freq: Once | ORAL | 0 refills | Status: DC | PRN

## 2022-05-01 DIAGNOSIS — R296 Repeated falls: Secondary | ICD-10-CM | POA: Diagnosis not present

## 2022-05-01 DIAGNOSIS — R4189 Other symptoms and signs involving cognitive functions and awareness: Secondary | ICD-10-CM | POA: Diagnosis not present

## 2022-05-01 DIAGNOSIS — R2681 Unsteadiness on feet: Secondary | ICD-10-CM | POA: Diagnosis not present

## 2022-05-01 DIAGNOSIS — G20A1 Parkinson's disease without dyskinesia, without mention of fluctuations: Secondary | ICD-10-CM | POA: Diagnosis not present

## 2022-05-01 DIAGNOSIS — F0280 Dementia in other diseases classified elsewhere without behavioral disturbance: Secondary | ICD-10-CM | POA: Diagnosis not present

## 2022-05-01 DIAGNOSIS — M6281 Muscle weakness (generalized): Secondary | ICD-10-CM | POA: Diagnosis not present

## 2022-05-01 DIAGNOSIS — Z741 Need for assistance with personal care: Secondary | ICD-10-CM | POA: Diagnosis not present

## 2022-05-01 DIAGNOSIS — R278 Other lack of coordination: Secondary | ICD-10-CM | POA: Diagnosis not present

## 2022-05-01 DIAGNOSIS — Z9181 History of falling: Secondary | ICD-10-CM | POA: Diagnosis not present

## 2022-05-01 DIAGNOSIS — R2689 Other abnormalities of gait and mobility: Secondary | ICD-10-CM | POA: Diagnosis not present

## 2022-05-04 ENCOUNTER — Other Ambulatory Visit: Payer: Self-pay | Admitting: Student

## 2022-05-04 DIAGNOSIS — F418 Other specified anxiety disorders: Secondary | ICD-10-CM

## 2022-05-04 MED ORDER — LORAZEPAM 1 MG PO TABS: 1.0000 mg | ORAL_TABLET | Freq: Once | ORAL | 0 refills | Status: DC | PRN

## 2022-05-04 NOTE — Progress Notes (Signed)
Patient to get ativan before dental procedure. OTO. '1mg'$  tablet.

## 2022-05-06 DIAGNOSIS — R2681 Unsteadiness on feet: Secondary | ICD-10-CM | POA: Diagnosis not present

## 2022-05-06 DIAGNOSIS — G20A1 Parkinson's disease without dyskinesia, without mention of fluctuations: Secondary | ICD-10-CM | POA: Diagnosis not present

## 2022-05-06 DIAGNOSIS — R296 Repeated falls: Secondary | ICD-10-CM | POA: Diagnosis not present

## 2022-05-06 DIAGNOSIS — F0280 Dementia in other diseases classified elsewhere without behavioral disturbance: Secondary | ICD-10-CM | POA: Diagnosis not present

## 2022-05-06 DIAGNOSIS — Z9181 History of falling: Secondary | ICD-10-CM | POA: Diagnosis not present

## 2022-05-06 DIAGNOSIS — R278 Other lack of coordination: Secondary | ICD-10-CM | POA: Diagnosis not present

## 2022-05-07 ENCOUNTER — Non-Acute Institutional Stay: Payer: Medicare Other | Admitting: Hospice

## 2022-05-07 DIAGNOSIS — R441 Visual hallucinations: Secondary | ICD-10-CM | POA: Diagnosis not present

## 2022-05-07 DIAGNOSIS — R278 Other lack of coordination: Secondary | ICD-10-CM | POA: Diagnosis not present

## 2022-05-07 DIAGNOSIS — F0280 Dementia in other diseases classified elsewhere without behavioral disturbance: Secondary | ICD-10-CM

## 2022-05-07 DIAGNOSIS — Z515 Encounter for palliative care: Secondary | ICD-10-CM | POA: Diagnosis not present

## 2022-05-07 DIAGNOSIS — F39 Unspecified mood [affective] disorder: Secondary | ICD-10-CM | POA: Diagnosis not present

## 2022-05-07 DIAGNOSIS — G20A1 Parkinson's disease without dyskinesia, without mention of fluctuations: Secondary | ICD-10-CM | POA: Diagnosis not present

## 2022-05-07 DIAGNOSIS — R451 Restlessness and agitation: Secondary | ICD-10-CM

## 2022-05-07 DIAGNOSIS — R2681 Unsteadiness on feet: Secondary | ICD-10-CM

## 2022-05-07 DIAGNOSIS — R296 Repeated falls: Secondary | ICD-10-CM | POA: Diagnosis not present

## 2022-05-07 DIAGNOSIS — Z9181 History of falling: Secondary | ICD-10-CM | POA: Diagnosis not present

## 2022-05-07 NOTE — Progress Notes (Signed)
    [facility] Consult Note Telephone: [phone]  Fax: [phone]    PATIENT NAME: Lucas Edwards [address] [place] [address]   [phone] (home)  DOB: Jun 25, 1950 MRN: [id number] PRIMARY CARE PROVIDER:    Dewayne Shorter, MD,  [place] [address] [phone]  [place]:   Dewayne Shorter, [place],  [place] [address] [phone]  RESPONSIBLE PARTY:    Contact Information     Name Relation Home Work Mobile   Sicks,Stephanie Niece [phone]  [phone]   Couch,[name] Relative [phone]  [phone]   couch,charles Other   [phone]        I met face to face with patient in the facility. Palliative Care was asked to follow this patient by consultation request of  Dewayne Shorter, MD to address advance care planning and complex medical decision making. This is a follow up visit.                                    ASSESSMENT AND PLAN / RECOMMENDATIONS:   Advance Care Planning/Goals of Care: Goals include to maximize quality of life and symptom management.   CODE STATUS: DNR  Symptom Management/Plan: Dementia: in the context of Parkinson disease.  Progressing memory loss/confusion, impoverished thoughts on limited language in line with dementia disease trajectory.  Encourage reminiscence, provide cueing and redirection as needed, and assistance with activities of daily living.  Continue sinemet as ordered, neurologist consult as needed/planned.   Gait instability:  Patient is a high fall risk. Completed PT/OT, continue restorative exercises. Fall precautions.   Agitation/hallucination: Continue Seroquel as ordered- visit with neurologist 04/07/2022 where Seroquel was initiated 50 mg nightly for sleep and hallucinations.  Use de-escalation techniques of redirection, calm approach and environmental modulation.  Follow-up with neurologist as planned.   Mood  disorder: Managed with Lexapro.  Encourage socialization and participation in facility activities.  Follow up Palliative Care Visit: Palliative care will continue to follow for complex medical decision making, advance care planning, and clarification of goals. Return 4-6 weeks or prn.  PPS: 40%  HOSPICE ELIGIBILITY/DIAGNOSIS: TBD  Chief Complaint: Palliative Medicine follow up visit.   HISTORY OF PRESENT ILLNESS:  Lucas Edwards is a 72 y.o. year old male  with multiple morbidities requiring close monitoring with high risk of complications and mortality: Parkinson's disease, dementia, agitation, hallucination, constipation, GERD, essential hypertension, REM behavioral disorder, muscle spasms, history of falls.  Patient in no acute distress, denies pain/discomfort.  FLACC 0. History obtained from review of EMR, discussion with primary team, and interview with family, facility staff/caregiver and/or [name].  Rest of 10 point ROS asked and negative. I reviewed available labs, medications, imaging, studies and related documents from the EMR.  Records reviewed and summarized above.  I spent 35 minutes providing this consultation; this includes time spent with patient/family, chart review and documentation. More than 50% of the time in this consultation was spent on counseling and coordinating communication   Thank you for the opportunity to participate in the care of [name]. Please call our office at [phone] if we can be of additional assistance.   Teodoro Spray, NP

## 2022-05-12 DIAGNOSIS — G20A1 Parkinson's disease without dyskinesia, without mention of fluctuations: Secondary | ICD-10-CM | POA: Diagnosis not present

## 2022-05-12 DIAGNOSIS — R278 Other lack of coordination: Secondary | ICD-10-CM | POA: Diagnosis not present

## 2022-05-12 DIAGNOSIS — R296 Repeated falls: Secondary | ICD-10-CM | POA: Diagnosis not present

## 2022-05-12 DIAGNOSIS — F0280 Dementia in other diseases classified elsewhere without behavioral disturbance: Secondary | ICD-10-CM | POA: Diagnosis not present

## 2022-05-12 DIAGNOSIS — R2681 Unsteadiness on feet: Secondary | ICD-10-CM | POA: Diagnosis not present

## 2022-05-12 DIAGNOSIS — Z9181 History of falling: Secondary | ICD-10-CM | POA: Diagnosis not present

## 2022-05-19 DIAGNOSIS — G20A1 Parkinson's disease without dyskinesia, without mention of fluctuations: Secondary | ICD-10-CM | POA: Diagnosis not present

## 2022-05-19 DIAGNOSIS — R296 Repeated falls: Secondary | ICD-10-CM | POA: Diagnosis not present

## 2022-05-19 DIAGNOSIS — Z9181 History of falling: Secondary | ICD-10-CM | POA: Diagnosis not present

## 2022-05-19 DIAGNOSIS — R2681 Unsteadiness on feet: Secondary | ICD-10-CM | POA: Diagnosis not present

## 2022-05-19 DIAGNOSIS — R278 Other lack of coordination: Secondary | ICD-10-CM | POA: Diagnosis not present

## 2022-05-19 DIAGNOSIS — F0280 Dementia in other diseases classified elsewhere without behavioral disturbance: Secondary | ICD-10-CM | POA: Diagnosis not present

## 2022-05-20 DIAGNOSIS — Z9181 History of falling: Secondary | ICD-10-CM | POA: Diagnosis not present

## 2022-05-20 DIAGNOSIS — R2681 Unsteadiness on feet: Secondary | ICD-10-CM | POA: Diagnosis not present

## 2022-05-20 DIAGNOSIS — G20A1 Parkinson's disease without dyskinesia, without mention of fluctuations: Secondary | ICD-10-CM | POA: Diagnosis not present

## 2022-05-20 DIAGNOSIS — R278 Other lack of coordination: Secondary | ICD-10-CM | POA: Diagnosis not present

## 2022-05-20 DIAGNOSIS — R296 Repeated falls: Secondary | ICD-10-CM | POA: Diagnosis not present

## 2022-05-20 DIAGNOSIS — F0280 Dementia in other diseases classified elsewhere without behavioral disturbance: Secondary | ICD-10-CM | POA: Diagnosis not present

## 2022-05-25 ENCOUNTER — Encounter: Payer: Self-pay | Admitting: Adult Health

## 2022-05-25 ENCOUNTER — Non-Acute Institutional Stay (SKILLED_NURSING_FACILITY): Payer: Medicare Other | Admitting: Adult Health

## 2022-05-25 DIAGNOSIS — G20B1 Parkinson's disease with dyskinesia, without mention of fluctuations: Secondary | ICD-10-CM | POA: Diagnosis not present

## 2022-05-25 DIAGNOSIS — F419 Anxiety disorder, unspecified: Secondary | ICD-10-CM | POA: Diagnosis not present

## 2022-05-25 DIAGNOSIS — F333 Major depressive disorder, recurrent, severe with psychotic symptoms: Secondary | ICD-10-CM | POA: Diagnosis not present
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
## 2022-06-02 DIAGNOSIS — R2681 Unsteadiness on feet: Secondary | ICD-10-CM | POA: Diagnosis not present

## 2022-06-02 DIAGNOSIS — M6281 Muscle weakness (generalized): Secondary | ICD-10-CM | POA: Diagnosis not present

## 2022-06-02 DIAGNOSIS — Z741 Need for assistance with personal care: Secondary | ICD-10-CM | POA: Diagnosis not present

## 2022-06-02 DIAGNOSIS — R4189 Other symptoms and signs involving cognitive functions and awareness: Secondary | ICD-10-CM | POA: Diagnosis not present

## 2022-06-02 DIAGNOSIS — F0280 Dementia in other diseases classified elsewhere without behavioral disturbance: Secondary | ICD-10-CM | POA: Diagnosis not present

## 2022-06-02 DIAGNOSIS — R2689 Other abnormalities of gait and mobility: Secondary | ICD-10-CM | POA: Diagnosis not present

## 2022-06-02 DIAGNOSIS — G20A1 Parkinson's disease without dyskinesia, without mention of fluctuations: Secondary | ICD-10-CM | POA: Diagnosis not present

## 2022-06-02 DIAGNOSIS — Z9181 History of falling: Secondary | ICD-10-CM | POA: Diagnosis not present

## 2022-06-02 DIAGNOSIS — R278 Other lack of coordination: Secondary | ICD-10-CM | POA: Diagnosis not present

## 2022-06-02 DIAGNOSIS — R296 Repeated falls: Secondary | ICD-10-CM | POA: Diagnosis not present

## 2022-06-09 DIAGNOSIS — R278 Other lack of coordination: Secondary | ICD-10-CM | POA: Diagnosis not present

## 2022-06-09 DIAGNOSIS — R2681 Unsteadiness on feet: Secondary | ICD-10-CM | POA: Diagnosis not present

## 2022-06-09 DIAGNOSIS — R296 Repeated falls: Secondary | ICD-10-CM | POA: Diagnosis not present

## 2022-06-09 DIAGNOSIS — B351 Tinea unguium: Secondary | ICD-10-CM | POA: Diagnosis not present

## 2022-06-09 DIAGNOSIS — Z9181 History of falling: Secondary | ICD-10-CM | POA: Diagnosis not present

## 2022-06-09 DIAGNOSIS — F0280 Dementia in other diseases classified elsewhere without behavioral disturbance: Secondary | ICD-10-CM | POA: Diagnosis not present

## 2022-06-09 DIAGNOSIS — I7091 Generalized atherosclerosis: Secondary | ICD-10-CM | POA: Diagnosis not present

## 2022-06-09 DIAGNOSIS — G20A1 Parkinson's disease without dyskinesia, without mention of fluctuations: Secondary | ICD-10-CM | POA: Diagnosis not present

## 2022-06-16 ENCOUNTER — Non-Acute Institutional Stay: Payer: Medicare Other | Admitting: Hospice

## 2022-06-16 DIAGNOSIS — R451 Restlessness and agitation: Secondary | ICD-10-CM | POA: Diagnosis not present

## 2022-06-16 DIAGNOSIS — R2681 Unsteadiness on feet: Secondary | ICD-10-CM | POA: Diagnosis not present

## 2022-06-16 DIAGNOSIS — G20A1 Parkinson's disease without dyskinesia, without mention of fluctuations: Secondary | ICD-10-CM | POA: Diagnosis not present

## 2022-06-16 DIAGNOSIS — Z20818 Contact with and (suspected) exposure to other bacterial communicable diseases: Secondary | ICD-10-CM | POA: Diagnosis not present

## 2022-06-16 DIAGNOSIS — R441 Visual hallucinations: Secondary | ICD-10-CM | POA: Diagnosis not present

## 2022-06-16 DIAGNOSIS — Z515 Encounter for palliative care: Secondary | ICD-10-CM

## 2022-06-16 DIAGNOSIS — B950 Streptococcus, group A, as the cause of diseases classified elsewhere: Secondary | ICD-10-CM | POA: Diagnosis not present

## 2022-06-16 DIAGNOSIS — F0280 Dementia in other diseases classified elsewhere without behavioral disturbance: Secondary | ICD-10-CM | POA: Diagnosis not present

## 2022-06-16 DIAGNOSIS — F39 Unspecified mood [affective] disorder: Secondary | ICD-10-CM | POA: Diagnosis not present

## 2022-06-16 NOTE — Progress Notes (Signed)
    [facility] Consult Note Telephone: [phone]  Fax: [phone]    PATIENT NAME: Lucas Edwards [address] [place] [address]   [phone] (home)  DOB: 07-19-1950 MRN: [id number] PRIMARY CARE PROVIDER:    Dewayne Shorter, MD,  [place] [address] [phone]  [place]:   Dewayne Shorter, [place],  [place] [address] [phone]  RESPONSIBLE PARTY:    Contact Information     Name Relation Home Work Mobile   Sicks,Stephanie Niece [phone]  [phone]   Couch,[name] Relative [phone]  [phone]   couch,charles Other   [phone]        I met face to face with patient in the facility. Palliative Care was asked to follow this patient by consultation request of  Dewayne Shorter, MD to address advance care planning and complex medical decision making. This is a follow up visit.                                    ASSESSMENT AND PLAN / RECOMMENDATIONS:   Advance Care Planning/Goals of Care: Goals include to maximize quality of life and symptom management.   CODE STATUS: DNR  Symptom Management/Plan: Dementia: in the context of Parkinson disease. Continue sinemet as ordered, neurologist consult as needed/planned.   Encourage reminiscence, provide cueing and redirection as needed, and assistance with activities of daily living.    Gait instability: Continue alarm attached to wheelchair /recliner. Patient is a high fall risk. Continue restorative exercises.  Round often on patient to identify and address needs. Fall precautions.   Agitation/hallucination: Continue Seroquel as ordered- visit with neurologist 04/07/2022 where Seroquel was initiated 50 mg nightly for sleep and hallucinations.   Use de-escalation techniques of redirection, calm approach and environmental modulation.   Follow-up with neurologist as planned.   Mood disorder: Continue Lexapro.  Psych consult as needed.  Encourage socialization and participation in facility activities.  Follow up Palliative Care Visit: Palliative care will continue to follow for complex medical decision making, advance care planning, and clarification of goals. Return 4-6 weeks or prn.  PPS: 40%  HOSPICE ELIGIBILITY/DIAGNOSIS: TBD  Chief Complaint: Palliative Medicine follow up visit.   HISTORY OF PRESENT ILLNESS:  Lucas Edwards is a 72 y.o. year old male  with multiple morbidities requiring close monitoring with high risk of complications and mortality: Parkinson's disease, dementia, agitation, hallucination, constipation, GERD, essential hypertension, REM behavioral disorder, muscle spasms, history of falls.  Patient in no acute distress, denies pain/discomfort.  He was cooperative, easily redirected, FLACC 0. History obtained from review of EMR, discussion with primary team, and interview with family, facility staff/caregiver and/or Mr. Lagunes.  Rest of 10 point ROS asked and negative. I reviewed available labs, medications, imaging, studies and related documents from the EMR.  Records reviewed and summarized above.   I spent 35 minutes providing this consultation; this includes time spent with patient/family, chart review and documentation. More than 50% of the time in this consultation was spent on counseling and coordinating communication   Thank you for the opportunity to participate in the care of Lucas Edwards. Please call our office at [phone] if we can be of additional assistance.   Teodoro Spray, NP

## 2022-06-17 DIAGNOSIS — Z9181 History of falling: Secondary | ICD-10-CM | POA: Diagnosis not present

## 2022-06-17 DIAGNOSIS — F0280 Dementia in other diseases classified elsewhere without behavioral disturbance: Secondary | ICD-10-CM | POA: Diagnosis not present

## 2022-06-17 DIAGNOSIS — G20A1 Parkinson's disease without dyskinesia, without mention of fluctuations: Secondary | ICD-10-CM | POA: Diagnosis not present

## 2022-06-17 DIAGNOSIS — R296 Repeated falls: Secondary | ICD-10-CM | POA: Diagnosis not present

## 2022-06-17 DIAGNOSIS — R2681 Unsteadiness on feet: Secondary | ICD-10-CM | POA: Diagnosis not present

## 2022-06-17 DIAGNOSIS — R278 Other lack of coordination: Secondary | ICD-10-CM | POA: Diagnosis not present

## 2022-06-18 ENCOUNTER — Encounter: Payer: Self-pay | Admitting: Nurse Practitioner

## 2022-06-18 ENCOUNTER — Non-Acute Institutional Stay (SKILLED_NURSING_FACILITY): Payer: Medicare Other | Admitting: Nurse Practitioner

## 2022-06-18 DIAGNOSIS — G20B1 Parkinson's disease with dyskinesia, without mention of fluctuations: Secondary | ICD-10-CM

## 2022-06-18 DIAGNOSIS — R296 Repeated falls: Secondary | ICD-10-CM

## 2022-06-18 DIAGNOSIS — F333 Major depressive disorder, recurrent, severe with psychotic symptoms: Secondary | ICD-10-CM

## 2022-06-18 DIAGNOSIS — K219 Gastro-esophageal reflux disease without esophagitis: Secondary | ICD-10-CM | POA: Diagnosis not present

## 2022-06-18 DIAGNOSIS — E440 Moderate protein-calorie malnutrition: Secondary | ICD-10-CM

## 2022-06-18 DIAGNOSIS — K5904 Chronic idiopathic constipation: Secondary | ICD-10-CM

## 2022-06-18 DIAGNOSIS — I10 Essential (primary) hypertension: Secondary | ICD-10-CM

## 2022-06-18 DIAGNOSIS — F0280 Dementia in other diseases classified elsewhere without behavioral disturbance: Secondary | ICD-10-CM

## 2022-06-18 DIAGNOSIS — G20A1 Parkinson's disease without dyskinesia, without mention of fluctuations: Secondary | ICD-10-CM | POA: Diagnosis not present

## 2022-06-18 NOTE — Progress Notes (Signed)
Location:  [facility].  Nursing Home Room Number: [facility]:  SNF [phone]) Provider:  Sherrie Mustache, NP  PCP: Dewayne Shorter, MD  Patient Care Team: Dewayne Shorter, MD as PCP - General (Family Medicine) [name], MD as PCP - Cardiology (Cardiology)  Extended Emergency Contact Information Primary Emergency Contact: [name] [address] [name] of [place] Phone: [phone] Mobile Phone: [phone] Relation: Niece Secondary Emergency Contact: Couch,[name] Address: [address].          [place], [place] [address] [name] of [place] Phone: [phone] Mobile Phone: [phone] Relation: Relative  Code Status:  DNR Goals of care: Advanced Directive information    06/18/2022   11:12 AM  Advanced Directives  Does Patient Have a Medical Advance Directive? Yes  Type of Advance Directive Out of facility DNR (pink MOST or yellow form)  Does patient want to make changes to medical advance directive? No - Patient declined     Chief Complaint  Patient presents with   Medical Management of Chronic Issues    Medical Management of Chronic Issues. NCIR Verified.     HPI:  Pt is a 72 y.o. male seen today for medical management of chronic diseases.    Pt is doing well. Request for refill on tramadol however nursing reports he has not been using. No complaints of pain- will dc at this time.   Constipation- controlled on senna-s BID  No recent falls.   No changes in behavior anxiety or depression.   Continues to need staff care for ADLS. Able to self propel in [facility] but needs assistance with transfers   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[place])    Essential hypertension, benign    GERD (gastroesophageal reflux disease)    Hyperlipidemia     Parkinson disease    Squamous cell carcinoma of skin 04/22/2017   mid vertex scalp   Squamous cell carcinoma of skin 06/05/2019   crown scalp   Urinary incontinence    Past Surgical History:  Procedure Laterality Date   HIP ARTHROPLASTY Left 08/06/2017   Procedure: ARTHROPLASTY BIPOLAR HIP (HEMIARTHROPLASTY);  Surgeon: Corky Mull, MD;  Location: [facility] ORS;  Service: Orthopedics;  Laterality: Left;   INGUINAL HERNIA REPAIR Bilateral    VASECTOMY      Allergies  Allergen Reactions   Ativan [Lorazepam] Other (See Comments)    confusion   Gabapentin    Sulfa Antibiotics Hives   Tylenol [Acetaminophen] Itching    Outpatient Encounter Medications as of 06/18/2022  Medication Sig   amoxicillin (AMOXIL) 500 MG capsule Take 2,000 mg by mouth as directed. Before Dental Procedures   carbidopa-levodopa (SINEMET CR) 50-200 MG tablet Take  Sinemet 50/200 mg one tablet every two hours ( 6am, 8am, 10am, 12pm, 2pm, 4pm 6pm, 8pm)   escitalopram (LEXAPRO) 5 MG tablet Take 5 mg by mouth daily.   guaiFENesin-dextromethorphan (ROBITUSSIN DM) 100-10 MG/5ML syrup Take 5 mLs by mouth every 4 (four) hours as needed for cough.   hydrocortisone 2.5 % lotion Apply 1 Application topically daily.   Infant Care Products [facility]) Apply to buttocks after perineal care every shift.   Lidocaine 4 % PTCH Apply 1 patch topically daily as needed for pain.   LORazepam (ATIVAN) 1 MG  tablet Take 0.5 mg by mouth. Every 24 hours as needed. Give 1/2 tablet by mouth prior to dental, repeat 1/2 tablet if needed.   Menthol-Zinc Oxide (CALMOSEPTINE) 0.44-20.6 % OINT Apply 1 Application topically daily. As needed to buttocks for irritation   Nutritional Supplements (ENSURE ENLIVE PO) Twice daily for wound healing.   pantoprazole (PROTONIX) 40 MG tablet Take 40 mg by mouth 2 (two) times daily.   polyethylene glycol powder (GLYCOLAX/MIRALAX) 17 GM/SCOOP powder Take 17 g by mouth daily as needed for constipation.    QUEtiapine (SEROQUEL) 50 MG tablet Take 50 mg by mouth at bedtime.   senna-docusate (SENOKOT-S) 8.6-50 MG tablet Take 1 tablet by mouth 2 (two) times daily.   traMADol (ULTRAM) 50 MG tablet Take 50 mg by mouth 2 (two) times daily as needed for pain.   [DISCONTINUED] aluminum hydroxide [facility]) ointment Apply three times daily as needed for redness to coccyx, buttock and peri area.   [DISCONTINUED] LORazepam (ATIVAN) 1 MG tablet Take 1 tablet (1 mg total) by mouth Once PRN for up to 1 dose for anxiety (prior to dental visit). (Patient taking differently: Take 0.5 mg by mouth. Every 24 hours as needed. Give 1/2 prior to dental, repeat half if needed.)   [DISCONTINUED] OLANZapine (ZYPREXA) 2.5 MG tablet Take 2.5 mg by mouth at bedtime.   No facility-administered encounter medications on file as of 06/18/2022.    Review of Systems  Unable to perform ROS: Dementia    Immunization History  Administered Date(s) Administered   Influenza-Unspecified 01/13/2022   Moderna Covid-19 Vaccine Bivalent Booster 30yrs & up 08/26/2021, 02/06/2022   Moderna SARS-COV2 Booster Vaccination 08/16/2020   Moderna Sars-Covid-2 Vaccination 04/10/2019, 05/08/2019   Pfizer Covid-19 Vaccine Bivalent Booster 50yrs & up 12/20/2020   Pneumococcal Polysaccharide-23 03/30/2008, 03/31/2015   Tdap 06/09/2013, 06/29/2021   Zoster Recombinat (Shingrix) 03/31/2015   Zoster, Live 10/18/2011   Pertinent  Health Maintenance Due  Topic Date Due   COLONOSCOPY (Pts 45-89yrs Insurance coverage will need to be confirmed)  Never done   INFLUENZA VACCINE  Completed      05/24/2021    8:35 AM 05/25/2021    2:52 AM 05/25/2021    8:20 AM 06/28/2021    5:28 PM 06/29/2021    9:20 AM  Fall Risk  (RETIRED) Patient Fall Risk Level High fall risk High fall risk High fall risk High fall risk High fall risk   Functional Status Survey:    Vitals:   06/18/22 1058  BP: 136/81  Pulse: 75  Resp: 18  Temp: 98.4 F (36.9 C)  SpO2: 96%   Weight: 147 lb 12.8 oz (67 kg)  Height: 5\' 9"  (1.753 m)   Body mass index is 21.83 kg/m. Physical Exam Constitutional:      General: He is not in acute distress.    Appearance: He is well-developed. He is not diaphoretic.  HENT:     Head: Normocephalic and atraumatic.     Right Ear: External ear normal.     Left Ear: External ear normal.     Mouth/Throat:     Pharynx: No oropharyngeal exudate.  Eyes:     Conjunctiva/sclera: Conjunctivae normal.     Pupils: Pupils are equal, round, and reactive to light.  Cardiovascular:     Rate and Rhythm: Normal rate and regular rhythm.     Heart sounds: Normal heart sounds.  Pulmonary:     Effort: Pulmonary effort is normal.     Breath sounds: Normal breath sounds.  Abdominal:     General: Bowel sounds are normal.     Palpations: Abdomen is soft.  Musculoskeletal:        General: No tenderness.     Cervical back: Normal range of motion and neck supple.     Right lower leg: No edema.     Left lower leg: No edema.  Skin:    General: Skin is warm and dry.  Neurological:     Mental Status: He is alert. Mental status is at baseline.     Motor: Weakness present.     Gait: Gait abnormal.     Labs reviewed: Recent Labs    06/28/21 1726 06/29/21 0924 07/28/21 0000 10/30/21 0000 04/04/22 0000 04/06/22 0000  NA 141 140   < > 138 142 138  K 4.2 3.7   < > 4.0 4.4 4.0  CL 106 103   < > 104 105 101  CO2 26 27   < > 27* 30* 29*  GLUCOSE 145* 122*  --   --   --   --   BUN 25* 27*   < > 20 20 22*  CREATININE 1.44* 1.28*   < > 1.0 1.7* 0.8  CALCIUM 9.2 9.1   < > 9.2 9.6 9.0   < > = values in this interval not displayed.   Recent Labs    06/29/21 0924 07/28/21 0000 10/30/21 0000  AST 19 10* 9*  ALT <5  --  3*  ALKPHOS 77 99 78  BILITOT 1.6*  --   --   PROT 7.5  --   --   ALBUMIN 4.4 4.4 4.2   Recent Labs    06/28/21 1726 06/29/21 0924 07/28/21 0000 10/30/21 0000  WBC 14.7* 14.5* 5.9 5.6  NEUTROABS 13.3* 12.4* 4,289.00   --   HGB 14.7 13.8 14.4 13.8  HCT 45.6 42.8 43 41  MCV 89.4 89.5  --   --   PLT 247 186 207 199   No results found for: "TSH" No results found for: "HGBA1C" Lab Results  Component Value Date   CHOL 173 05/15/2017   HDL 37 (L) 05/15/2017   LDLCALC 119 (H) 05/15/2017   TRIG 85 05/15/2017   CHOLHDL 4.7 05/15/2017    Significant Diagnostic Results in last 30 days:  No results found.  Assessment/Plan 1. Parkinson's disease with dyskinesia, unspecified whether manifestations fluctuate -stable on sinemet, continues to be a high fall risk due to being unsteady with progressive weakness. Staff continues to monitor closely and fall precautions in place  2. 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[place]) -Stable, no acute changes in cognitive or functional status, continue supportive care by staff.   3. Essential hypertension, benign -Blood pressure well controlled, goal bp <140/90 Continue current medications and dietary modifications follow metabolic panel  4. Frequent falls No recent falls, fall precautions in place due to instability and weakness.   5. Gastroesophageal reflux disease without esophagitis Controlled on protonix, no symptoms at this time. Will decrease to once daily and monitor.   6. Chronic idiopathic constipation -stable on senna S BID  7. Depression, major, recurrent, severe with psychosis ([place]) -controlled on lexapro   8. Moderate protein-calorie malnutrition ([place]) -noted to have positive weight gain this month. Will continue supplement and continue with weights.   Carlos American. [place], [facility] [phone]

## 2022-06-22 DIAGNOSIS — R319 Hematuria, unspecified: Secondary | ICD-10-CM | POA: Diagnosis not present

## 2022-06-25 DIAGNOSIS — R319 Hematuria, unspecified: Secondary | ICD-10-CM | POA: Diagnosis not present

## 2022-06-25 LAB — CBC AND DIFFERENTIAL
HCT: 42 (ref 41–53)
Hemoglobin: 14 (ref 13.5–17.5)
Neutrophils Absolute: 4515
Platelets: 186 10*3/uL (ref 150–400)
WBC: 6.8

## 2022-06-25 LAB — CBC: RBC: 4.63 (ref 3.87–5.11)

## 2022-07-01 DIAGNOSIS — R2689 Other abnormalities of gait and mobility: Secondary | ICD-10-CM | POA: Diagnosis not present

## 2022-07-01 DIAGNOSIS — F0280 Dementia in other diseases classified elsewhere without behavioral disturbance: Secondary | ICD-10-CM | POA: Diagnosis not present

## 2022-07-01 DIAGNOSIS — R2681 Unsteadiness on feet: Secondary | ICD-10-CM | POA: Diagnosis not present

## 2022-07-01 DIAGNOSIS — R4189 Other symptoms and signs involving cognitive functions and awareness: Secondary | ICD-10-CM | POA: Diagnosis not present

## 2022-07-01 DIAGNOSIS — R278 Other lack of coordination: Secondary | ICD-10-CM | POA: Diagnosis not present

## 2022-07-01 DIAGNOSIS — R296 Repeated falls: Secondary | ICD-10-CM | POA: Diagnosis not present

## 2022-07-01 DIAGNOSIS — G20A1 Parkinson's disease without dyskinesia, without mention of fluctuations: Secondary | ICD-10-CM | POA: Diagnosis not present

## 2022-07-01 DIAGNOSIS — Z9181 History of falling: Secondary | ICD-10-CM | POA: Diagnosis not present

## 2022-07-01 DIAGNOSIS — M6281 Muscle weakness (generalized): Secondary | ICD-10-CM | POA: Diagnosis not present

## 2022-07-01 DIAGNOSIS — Z741 Need for assistance with personal care: Secondary | ICD-10-CM | POA: Diagnosis not present

## 2022-07-07 ENCOUNTER — Encounter: Payer: Self-pay | Admitting: Nurse Practitioner

## 2022-07-07 ENCOUNTER — Non-Acute Institutional Stay (INDEPENDENT_AMBULATORY_CARE_PROVIDER_SITE_OTHER): Payer: Medicare Other | Admitting: Nurse Practitioner

## 2022-07-07 DIAGNOSIS — Z23 Encounter for immunization: Secondary | ICD-10-CM | POA: Diagnosis not present

## 2022-07-07 DIAGNOSIS — Z0000 Encounter for general adult medical examination without abnormal findings: Secondary | ICD-10-CM

## 2022-07-07 NOTE — Patient Instructions (Signed)
  Lucas Edwards , Thank you for taking time to come for your Medicare Wellness Visit. I appreciate your ongoing commitment to your health goals. Please review the following plan we discussed and let me know if I can assist you in the future.   These are the goals we discussed:  Goals   None     This is a list of the screening recommended for you and due dates:  Health Maintenance  Topic Date Due   Zoster (Shingles) Vaccine (2 of 2) 05/26/2015   Pneumonia Vaccine (2 of 2 - PCV) 10/17/2016   COVID-19 Vaccine (6 - 2023-24 season) 04/03/2022   Flu Shot  10/29/2022   DTaP/Tdap/Td vaccine (3 - Td or Tdap) 06/30/2031   HPV Vaccine  Aged Out   Colon Cancer Screening  Discontinued   Hepatitis C Screening: USPSTF Recommendation to screen - Ages 18-79 yo.  Discontinued

## 2022-07-07 NOTE — Progress Notes (Signed)
Subjective:   Lucas Edwards is a 72 y.o. male who presents for Medicare Annual/Subsequent preventive examination.  Review of Systems     Cardiac Risk Factors include: advanced age (>78men, >44 women);dyslipidemia;hypertension;sedentary lifestyle     Objective:    Today's Vitals   07/07/22 1534  BP: 119/73  Pulse: 75  Weight: 152 lb 9.6 oz (69.2 kg)  Height: 5\' 9"  (1.753 m)  PainSc: 1    Body mass index is 22.54 kg/m.     07/07/2022    3:36 PM 06/18/2022   11:12 AM 05/25/2022   11:50 AM 04/02/2022    9:40 AM 02/05/2022    3:53 PM 01/15/2022    3:21 PM 06/29/2021    9:20 AM  Advanced Directives  Does Patient Have a Medical Advance Directive? Yes Yes Yes Yes Yes Yes Yes  Type of Advance Directive Out of facility DNR (pink MOST or yellow form) Out of facility DNR (pink MOST or yellow form) Out of facility DNR (pink MOST or yellow form) Out of facility DNR (pink MOST or yellow form) Out of facility DNR (pink MOST or yellow form) Healthcare Power of [place];Out of facility DNR (pink MOST or yellow form) Out of facility DNR (pink MOST or yellow form)  Does patient want to make changes to medical advance directive? No - Patient declined No - Patient declined No - Patient declined No - Patient declined  No - Patient declined No - Patient declined  Copy of Healthcare Power of Attorney in Chart?      No - copy requested   Pre-existing out of facility DNR order (yellow form or pink MOST form) Yellow form placed in chart (order not valid for inpatient use)  Yellow form placed in chart (order not valid for inpatient use)  Yellow form placed in chart (order not valid for inpatient use) Yellow form placed in chart (order not valid for inpatient use) Yellow form placed in chart (order not valid for inpatient use)    Current Medications (verified) Outpatient Encounter Medications as of 07/07/2022  Medication Sig   amoxicillin (AMOXIL) 500 MG capsule Take 2,000 mg by mouth as directed. Before  Dental Procedures   carbidopa-levodopa (SINEMET CR) 50-200 MG tablet Take  Sinemet 50/200 mg one tablet every two hours ( 6am, 8am, 10am, 12pm, 2pm, 4pm 6pm, 8pm)   escitalopram (LEXAPRO) 5 MG tablet Take 5 mg by mouth daily.   guaiFENesin-dextromethorphan (ROBITUSSIN DM) 100-10 MG/5ML syrup Take 5 mLs by mouth every 4 (four) hours as needed for cough.   hydrocortisone 2.5 % lotion Apply 1 Application topically daily.   Infant Care Products [facility] EX) Apply to buttocks after perineal care every shift.   Lidocaine 4 % PTCH Apply 1 patch topically daily as needed for pain.   LORazepam (ATIVAN) 1 MG tablet Take 0.5 mg by mouth. Every 24 hours as needed. Give 1/2 tablet by mouth prior to dental, repeat 1/2 tablet if needed.   Menthol-Zinc Oxide (CALMOSEPTINE) 0.44-20.6 % OINT Apply 1 Application topically daily. As needed to buttocks for irritation   pantoprazole (PROTONIX) 40 MG tablet Take 40 mg by mouth daily.   polyethylene glycol powder (GLYCOLAX/MIRALAX) 17 GM/SCOOP powder Take 17 g by mouth daily as needed for constipation.   QUEtiapine (SEROQUEL) 50 MG tablet Take 50 mg by mouth at bedtime.   senna-docusate (SENOKOT-S) 8.6-50 MG tablet Take 1 tablet by mouth 2 (two) times daily.   [DISCONTINUED] Nutritional Supplements (ENSURE ENLIVE PO) Twice daily for wound healing.   [  DISCONTINUED] traMADol (ULTRAM) 50 MG tablet Take 50 mg by mouth 2 (two) times daily as needed for pain.   No facility-administered encounter medications on file as of 07/07/2022.    Allergies (verified) Ativan [lorazepam], Gabapentin, Sulfa antibiotics, and Tylenol [acetaminophen]   History: Past Medical History:  Diagnosis Date   Abdominal hernia    Basal cell carcinoma 06/30/2017   R mid back parapsinal   Dementia associated with Parkinson's disease    Essential hypertension, benign    GERD (gastroesophageal reflux disease)    Hyperlipidemia    Parkinson disease    Squamous cell carcinoma of skin 04/22/2017    mid vertex scalp   Squamous cell carcinoma of skin 06/05/2019   crown scalp   Urinary incontinence    Past Surgical History:  Procedure Laterality Date   HIP ARTHROPLASTY Left 08/06/2017   Procedure: ARTHROPLASTY BIPOLAR HIP (HEMIARTHROPLASTY);  Surgeon: Christena Flake, MD;  Location: [facility] ORS;  Service: Orthopedics;  Laterality: Left;   INGUINAL HERNIA REPAIR Bilateral    VASECTOMY     Family History  Problem Relation Age of Onset   Multiple sclerosis Mother        died @ 47   Stroke Father        died @ 35   Heart attack Father    Lung cancer Sister        died in her 45's   Social History   Socioeconomic History   Marital status: Widowed    Spouse name: Not on file   Number of children: 1   Years of education: Not on file   Highest education level: Not on file  Occupational History   Occupation: Engineer, petroleum    Comment: Disabled/retired  Tobacco Use   Smoking status: Former   Smokeless tobacco: Never   Tobacco comments:    during high Forensic scientist Use: Never used  Substance and Sexual Activity   Alcohol use: Yes    Comment: whenever he gets the chance (once every few months)   Drug use: No    Comment: marijuana occ   Sexual activity: Never  Other Topics Concern   Not on file  Social History Narrative   Divorced then widowed   1 stepdaughter      Has living will   Niece Patrica Duel is health care POA   Has DNR   Would accept hospital   No extended tube feedings   Social Determinants of Health   Financial Resource Strain: Not on file  Food Insecurity: Not on file  Transportation Needs: Not on file  Physical Activity: Not on file  Stress: Not on file  Social Connections: Not on file    Tobacco Counseling Counseling given: Not Answered Tobacco comments: during high school/college   Clinical Intake:  Pre-visit preparation completed: Yes  Pain : Faces Pain Score: 1  Faces Pain Scale: Hurts a little bit Pain  Type: Chronic pain  Faces Pain Scale: Hurts a little bit  BMI - recorded: 22 Nutritional Status: BMI of 19-24  Normal  How often do you need to have someone help you when you read instructions, pamphlets, or other written materials from your doctor or pharmacy?: 5 - Always  Diabetic?no         Activities of Daily Living    07/07/2022    3:36 PM  In your present state of health, do you have any difficulty performing the following activities:  Hearing? 1  Vision?  0  Difficulty concentrating or making decisions? 1  Walking or climbing stairs? 1  Dressing or bathing? 1  Doing errands, shopping? 1  Preparing Food and eating ? Y  Using the Toilet? Y  In the past six months, have you accidently leaked urine? Y  Do you have problems with loss of bowel control? Y  Managing your Medications? Y  Managing your Finances? Y  Housekeeping or managing your Housekeeping? Y    Patient Care Team: Earnestine MealingBeamer, Victoria, MD as PCP - General (Family Medicine) Iran OuchArida, Muhammad A, MD as PCP - Cardiology (Cardiology)  Indicate any recent Medical Services you may have received from other than [facility] providers in the past year (date may be approximate).     Assessment:   This is a routine wellness examination for Gelene MinkFrederick.  Hearing/Vision screen No results found.  Dietary issues and exercise activities discussed: Current Exercise Habits: The patient does not participate in regular exercise at present   Goals Addressed   None    Depression Screen     No data to display          Fall Risk     No data to display          FALL RISK PREVENTION PERTAINING TO THE HOME:  Any stairs in or around the home? No  If so, are there any without handrails?  na Home free of loose throw rugs in walkways, pet beds, electrical cords, etc? Yes  Adequate lighting in your home to reduce risk of falls? Yes   ASSISTIVE DEVICES UTILIZED TO PREVENT FALLS:  Life alert?  no Use of a cane, walker or  w/c? Yes  Grab bars in the bathroom? Yes  Shower chair or bench in shower? Yes  Elevated toilet seat or a handicapped toilet? No   TIMED UP AND GO:  Was the test performed? No .    Cognitive Function:      10/24/2013    7:52 AM  Montreal Cognitive Assessment   Visuospatial/ Executive (0/5) 5  Naming (0/3) 3  Attention: Read list of digits (0/2) 2  Attention: Read list of letters (0/1) 1  Attention: Serial 7 subtraction starting at 100 (0/3) 3  Language: Repeat phrase (0/2) 2  Language : Fluency (0/1) 1  Abstraction (0/2) 2  Delayed Recall (0/5) 5  Orientation (0/6) 6  Total 30  Adjusted Score (based on education) 30      Immunizations Immunization History  Administered Date(s) Administered   Influenza-Unspecified 01/13/2022   Moderna Covid-19 Vaccine Bivalent Booster 5056yrs & up 08/26/2021, 02/06/2022   Moderna SARS-COV2 Booster Vaccination 08/16/2020   Moderna Sars-Covid-2 Vaccination 04/10/2019, 05/08/2019   Pfizer Covid-19 Vaccine Bivalent Booster 4761yrs & up 12/20/2020   Pneumococcal Polysaccharide-23 03/30/2008, 03/31/2015   Tdap 06/09/2013, 06/29/2021   Zoster Recombinat (Shingrix) 03/31/2015   Zoster, Live 10/18/2011    TDAP status: Up to date  Flu Vaccine status: Up to date  Pneumococcal vaccine status: Due, Education has been provided regarding the importance of this vaccine. Advised may receive this vaccine at local pharmacy or Health Dept. Aware to provide a copy of the vaccination record if obtained from local pharmacy or Health Dept. Verbalized acceptance and understanding.  Covid-19 vaccine status: Completed vaccines  Qualifies for Shingles Vaccine? Yes   Zostavax completed No   Shingrix Completed?: No.    Education has been provided regarding the importance of this vaccine. Patient has been advised to call insurance company to determine out of pocket expense  if they have not yet received this vaccine. Advised may also receive vaccine at local  pharmacy or Health Dept. Verbalized acceptance and understanding.  Screening Tests Health Maintenance  Topic Date Due   Zoster Vaccines- Shingrix (2 of 2) 05/26/2015   Pneumonia Vaccine 12+ Years old (2 of 2 - PCV) 10/17/2016   COVID-19 Vaccine (6 - 2023-24 season) 04/03/2022   INFLUENZA VACCINE  10/29/2022   DTaP/Tdap/Td (3 - Td or Tdap) 06/30/2031   HPV VACCINES  Aged Out   COLONOSCOPY (Pts 45-69yrs Insurance coverage will need to be confirmed)  Discontinued   Hepatitis C Screening  Discontinued    Health Maintenance  Health Maintenance Due  Topic Date Due   Zoster Vaccines- Shingrix (2 of 2) 05/26/2015   Pneumonia Vaccine 49+ Years old (2 of 2 - PCV) 10/17/2016   COVID-19 Vaccine (6 - 2023-24 season) 04/03/2022    Due to dementia not a candidate   Lung Cancer Screening: (Low Dose CT Chest recommended if Age 17-80 years, 30 pack-year currently smoking OR have quit w/in 15years.) does not qualify.   Lung Cancer Screening Referral: na  Additional Screening:  Hepatitis C Screening: does qualify; pts POA declines at this time due to dementia   Vision Screening: Recommended annual ophthalmology exams for early detection of glaucoma and other disorders of the eye. Is the patient up to date with their annual eye exam?  No  Unable to complete due to dementia  Dental Screening: Recommended annual dental exams for proper oral hygiene  Community Resource Referral / Chronic Care Management: CRR required this visit?  No   CCM required this visit?  No      Plan:     I have personally reviewed and noted the following in the patient's chart:   Medical and social history Use of alcohol, tobacco or illicit drugs  Current medications and supplements including opioid prescriptions. Patient is not currently taking opioid prescriptions. Functional ability and status Nutritional status Physical activity Advanced directives List of other physicians Hospitalizations, surgeries,  and ER visits in previous 12 months Vitals Screenings to include cognitive, depression, and falls Referrals and appointments  In addition, I have reviewed and discussed with patient certain preventive protocols, quality metrics, and best practice recommendations. A written personalized care plan for preventive services as well as general preventive health recommendations were provided to patient.     Sharon Seller, NP   07/07/2022   Place of service: [facility]

## 2022-07-14 DIAGNOSIS — F0280 Dementia in other diseases classified elsewhere without behavioral disturbance: Secondary | ICD-10-CM | POA: Diagnosis not present

## 2022-07-14 DIAGNOSIS — G20A1 Parkinson's disease without dyskinesia, without mention of fluctuations: Secondary | ICD-10-CM | POA: Diagnosis not present

## 2022-07-14 DIAGNOSIS — R278 Other lack of coordination: Secondary | ICD-10-CM | POA: Diagnosis not present

## 2022-07-14 DIAGNOSIS — R2681 Unsteadiness on feet: Secondary | ICD-10-CM | POA: Diagnosis not present

## 2022-07-14 DIAGNOSIS — R296 Repeated falls: Secondary | ICD-10-CM | POA: Diagnosis not present

## 2022-07-14 DIAGNOSIS — Z9181 History of falling: Secondary | ICD-10-CM | POA: Diagnosis not present

## 2022-07-24 ENCOUNTER — Non-Acute Institutional Stay: Payer: Medicare Other | Admitting: Hospice

## 2022-07-24 DIAGNOSIS — F39 Unspecified mood [affective] disorder: Secondary | ICD-10-CM

## 2022-07-24 DIAGNOSIS — R451 Restlessness and agitation: Secondary | ICD-10-CM | POA: Diagnosis not present

## 2022-07-24 DIAGNOSIS — G20A1 Parkinson's disease without dyskinesia, without mention of fluctuations: Secondary | ICD-10-CM

## 2022-07-24 DIAGNOSIS — R441 Visual hallucinations: Secondary | ICD-10-CM

## 2022-07-24 DIAGNOSIS — R2681 Unsteadiness on feet: Secondary | ICD-10-CM

## 2022-07-24 DIAGNOSIS — Z515 Encounter for palliative care: Secondary | ICD-10-CM | POA: Diagnosis not present

## 2022-07-24 DIAGNOSIS — F0280 Dementia in other diseases classified elsewhere without behavioral disturbance: Secondary | ICD-10-CM | POA: Diagnosis not present

## 2022-07-24 NOTE — Progress Notes (Signed)
Therapist, nutritional Palliative Care Consult Note Telephone: [phone]  Fax: [phone]    PATIENT NAME: Lucas Edwards [address] [place] [address]   [phone] (home)  DOB: 1950/06/18 MRN: [id number] PRIMARY CARE PROVIDER:    Earnestine Mealing, MD,  [address] [place] [address] [phone]  REFERRING PROVIDER:   Earnestine Mealing, MD [address] [place],  [place] [address] [phone]  RESPONSIBLE PARTY:    Contact Information     Name Relation Home Work Mobile   Sicks,Stephanie Niece [phone]  [phone]   Couch,[name] Relative [phone]  [phone]   couch,charles Other   [phone]        I met face to face with patient in the facility. Palliative Care was asked to follow this patient by consultation request of  Earnestine Mealing, MD to address advance care planning and complex medical decision making. This is a follow up visit.                                    ASSESSMENT AND PLAN / RECOMMENDATIONS:   Advance Care Planning/Goals of Care: Goals include to maximize quality of life and symptom management.   CODE STATUS: DNR  Symptom Management/Plan: Dementia: Ongoing memory loss/confusion in the context of Parkinson disease;  provide cueing and redirection as needed. Continue sinemet as ordered, neurologist consult as needed/planned.   Encourage reminiscence, and assistance with activities of daily living.  Fall/safety precautions.   Gait instability: Patient is a high fall risk.  Patient will benefit from PT/OT for strengthening and gait training.  Continue alarm attached to wheelchair /recliner.   Round often on patient to identify and address needs. Fall precautions.   Agitation/hallucination: Continue Seroquel as ordered  Use de-escalation techniques of redirection, calm approach and environmental modulation.   Follow-up with neurologist as planned.   Mood disorder: Continue Lexapro.  Psych  consult as needed.  Encourage socialization and participation in facility activities.  Follow up Palliative Care Visit: Palliative care will continue to follow for complex medical decision making, advance care planning, and clarification of goals. Return 4-6 weeks or prn.  PPS: 40%  HOSPICE ELIGIBILITY/DIAGNOSIS: TBD  Chief Complaint: Palliative Medicine follow up visit.   HISTORY OF PRESENT ILLNESS:  Lucas Edwards is a 72 y.o. year old male  with multiple morbidities requiring close monitoring with high risk of complications and mortality: Parkinson's disease, dementia, agitation, hallucination, constipation, GERD, essential hypertension, REM behavioral disorder, muscle spasms, history of falls.  Patient in no acute distress, denies pain/discomfort.  He was cooperative, easily redirected, seen interacting with staff and going outside the unit with staff, pleasant FLACC 0. Nursing reports patient following orders and more cooperative than before.  History obtained from review of EMR, discussion with primary team, and interview with family, facility staff/caregiver and/or Mr. Pettinato.  Rest of 10 point ROS asked and negative. I reviewed available labs, medications, imaging, studies and related documents from the EMR.  Records reviewed and summarized above.   I spent 35 minutes providing this consultation; this includes time spent with patient/family, chart review and documentation. More than 50% of the time in this consultation was spent on counseling and coordinating communication.  Thank you for the opportunity to participate in the care of Mr. Stanko. Please call our office at [phone] if we can be of additional assistance.   Rosaura Carpenter, NP

## 2022-07-28 DIAGNOSIS — R278 Other lack of coordination: Secondary | ICD-10-CM | POA: Diagnosis not present

## 2022-07-28 DIAGNOSIS — R296 Repeated falls: Secondary | ICD-10-CM | POA: Diagnosis not present

## 2022-07-28 DIAGNOSIS — Z9181 History of falling: Secondary | ICD-10-CM | POA: Diagnosis not present

## 2022-07-28 DIAGNOSIS — F0280 Dementia in other diseases classified elsewhere without behavioral disturbance: Secondary | ICD-10-CM | POA: Diagnosis not present

## 2022-07-28 DIAGNOSIS — R2681 Unsteadiness on feet: Secondary | ICD-10-CM | POA: Diagnosis not present

## 2022-07-28 DIAGNOSIS — G20A1 Parkinson's disease without dyskinesia, without mention of fluctuations: Secondary | ICD-10-CM | POA: Diagnosis not present

## 2022-08-03 DIAGNOSIS — I10 Essential (primary) hypertension: Secondary | ICD-10-CM | POA: Diagnosis not present

## 2022-08-03 LAB — HEPATIC FUNCTION PANEL
ALT: 6 U/L — AB (ref 10–40)
AST: 14 (ref 14–40)
Alkaline Phosphatase: 69 (ref 25–125)
Bilirubin, Total: 1

## 2022-08-03 LAB — BASIC METABOLIC PANEL
BUN: 15 (ref 4–21)
CO2: 28 — AB (ref 13–22)
Chloride: 105 (ref 99–108)
Creatinine: 0.9 (ref 0.6–1.3)
Glucose: 69
Potassium: 3.8 mEq/L (ref 3.5–5.1)
Sodium: 140 (ref 137–147)

## 2022-08-03 LAB — COMPREHENSIVE METABOLIC PANEL
Albumin: 3.9 (ref 3.5–5.0)
Calcium: 9.1 (ref 8.7–10.7)
Globulin: 3.1
eGFR: 87

## 2022-08-03 LAB — CBC AND DIFFERENTIAL
HCT: 45 (ref 41–53)
Hemoglobin: 14.9 (ref 13.5–17.5)
Neutrophils Absolute: 4420
Platelets: 206 10*3/uL (ref 150–400)
WBC: 6.8

## 2022-08-03 LAB — CBC: RBC: 5 (ref 3.87–5.11)

## 2022-08-08 DIAGNOSIS — G20A1 Parkinson's disease without dyskinesia, without mention of fluctuations: Secondary | ICD-10-CM | POA: Diagnosis not present

## 2022-08-08 DIAGNOSIS — R2689 Other abnormalities of gait and mobility: Secondary | ICD-10-CM | POA: Diagnosis not present

## 2022-08-08 DIAGNOSIS — R278 Other lack of coordination: Secondary | ICD-10-CM | POA: Diagnosis not present

## 2022-08-08 DIAGNOSIS — M6281 Muscle weakness (generalized): Secondary | ICD-10-CM | POA: Diagnosis not present

## 2022-08-08 DIAGNOSIS — Z9181 History of falling: Secondary | ICD-10-CM | POA: Diagnosis not present

## 2022-08-08 DIAGNOSIS — R4189 Other symptoms and signs involving cognitive functions and awareness: Secondary | ICD-10-CM | POA: Diagnosis not present

## 2022-08-08 DIAGNOSIS — M1990 Unspecified osteoarthritis, unspecified site: Secondary | ICD-10-CM | POA: Diagnosis not present

## 2022-08-08 DIAGNOSIS — Z741 Need for assistance with personal care: Secondary | ICD-10-CM | POA: Diagnosis not present

## 2022-08-08 DIAGNOSIS — I10 Essential (primary) hypertension: Secondary | ICD-10-CM | POA: Diagnosis not present

## 2022-08-08 DIAGNOSIS — R32 Unspecified urinary incontinence: Secondary | ICD-10-CM | POA: Diagnosis not present

## 2022-08-08 DIAGNOSIS — F0280 Dementia in other diseases classified elsewhere without behavioral disturbance: Secondary | ICD-10-CM | POA: Diagnosis not present

## 2022-08-13 DIAGNOSIS — I10 Essential (primary) hypertension: Secondary | ICD-10-CM | POA: Diagnosis not present

## 2022-08-13 DIAGNOSIS — M1990 Unspecified osteoarthritis, unspecified site: Secondary | ICD-10-CM | POA: Diagnosis not present

## 2022-08-13 DIAGNOSIS — G20A1 Parkinson's disease without dyskinesia, without mention of fluctuations: Secondary | ICD-10-CM | POA: Diagnosis not present

## 2022-08-13 DIAGNOSIS — R32 Unspecified urinary incontinence: Secondary | ICD-10-CM | POA: Diagnosis not present

## 2022-08-13 DIAGNOSIS — F0280 Dementia in other diseases classified elsewhere without behavioral disturbance: Secondary | ICD-10-CM | POA: Diagnosis not present

## 2022-08-13 DIAGNOSIS — Z9181 History of falling: Secondary | ICD-10-CM | POA: Diagnosis not present

## 2022-08-14 ENCOUNTER — Encounter: Payer: Self-pay | Admitting: Student

## 2022-08-14 ENCOUNTER — Non-Acute Institutional Stay (SKILLED_NURSING_FACILITY): Payer: Medicare Other | Admitting: Student

## 2022-08-14 DIAGNOSIS — G20A1 Parkinson's disease without dyskinesia, without mention of fluctuations: Secondary | ICD-10-CM | POA: Diagnosis not present

## 2022-08-14 DIAGNOSIS — Z66 Do not resuscitate: Secondary | ICD-10-CM | POA: Diagnosis not present

## 2022-08-14 DIAGNOSIS — F0280 Dementia in other diseases classified elsewhere without behavioral disturbance: Secondary | ICD-10-CM | POA: Diagnosis not present

## 2022-08-14 DIAGNOSIS — N400 Enlarged prostate without lower urinary tract symptoms: Secondary | ICD-10-CM

## 2022-08-14 DIAGNOSIS — I10 Essential (primary) hypertension: Secondary | ICD-10-CM

## 2022-08-14 DIAGNOSIS — G20B2 Parkinson's disease with dyskinesia, with fluctuations: Secondary | ICD-10-CM

## 2022-08-14 NOTE — Progress Notes (Unsigned)
Location:  Other [facility].  Nursing Home Room Number: [facility] [address]A Place of Service:  SNF [phone]) Provider:  Earnestine Mealing, MD  Patient Care Team: Earnestine Mealing, MD as PCP - General (Family Medicine) Iran Ouch, MD as PCP - Cardiology (Cardiology)  Extended Emergency Contact Information Primary Emergency Contact: Hillary [place], [place] [address] [place] of [place] Home Phone: [phone] Mobile Phone: [phone] Relation: Niece Secondary Emergency Contact: Couch,[name] Address: [address]          [place], [place] [address] [place] of [place] Home Phone: [phone] Mobile Phone: [phone] Relation: Relative  Code Status:  DNR Goals of care: Advanced Directive information    08/14/2022   10:05 AM  Advanced Directives  Does Patient Have a Medical Advance Directive? Yes  Type of Advance Directive Out of facility DNR (pink MOST or yellow form)  Does patient want to make changes to medical advance directive? No - Patient declined     Chief Complaint  Patient presents with   Medical Management of Chronic Issues    Medical Management of Chronic Issues.     HPI:  Pt is a 72 y.o. male seen today for medical management of chronic diseases.    Patient is mumbling nonsensical words.   Nursing without concerns at this time.    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HCC)    Essential hypertension, benign    GERD (gastroesophageal reflux disease)    Hyperlipidemia    Parkinson disease    Squamous cell carcinoma of skin 04/22/2017   mid vertex scalp   Squamous cell carcinoma of skin 06/05/2019   crown scalp   Urinary incontinence    Past Surgical History:  Procedure Laterality Date   HIP ARTHROPLASTY Left 08/06/2017   Procedure: ARTHROPLASTY BIPOLAR HIP (HEMIARTHROPLASTY);   Surgeon: Christena Flake, MD;  Location: [facility] ORS;  Service: Orthopedics;  Laterality: Left;   INGUINAL HERNIA REPAIR Bilateral    VASECTOMY      Allergies  Allergen Reactions   Ativan [Lorazepam] Other (See Comments)    confusion   Gabapentin    Sulfa Antibiotics Hives   Tylenol [Acetaminophen] Itching    Outpatient Encounter Medications as of 08/14/2022  Medication Sig   amoxicillin (AMOXIL) 500 MG capsule Take 2,000 mg by mouth as directed. Before Dental Procedures   carbidopa-levodopa (SINEMET CR) 50-200 MG tablet Take  Sinemet 50/200 mg one tablet every two hours ( 6am, 8am, 10am, 12pm, 2pm, 4pm 6pm, 8pm)   escitalopram (LEXAPRO) 5 MG tablet Take 5 mg by mouth daily.   guaiFENesin-dextromethorphan (ROBITUSSIN DM) 100-10 MG/5ML syrup Take 5 mLs by mouth every 4 (four) hours as needed for cough.   hydrocortisone 2.5 % lotion Apply 1 Application topically daily.   Infant Care Products [facility] EX) Apply to buttocks after perineal care every shift.   Lidocaine 4 % PTCH Apply 1 patch topically daily as needed for pain.   LORazepam (ATIVAN) 1 MG tablet Take 0.5 mg by mouth. Every 24 hours as needed. Give 1/2 tablet by mouth prior to dental, repeat 1/2 tablet if needed.   Menthol-Zinc Oxide (CALMOSEPTINE) 0.44-20.6 % OINT Apply 1 Application topically daily. As needed to buttocks for irritation   pantoprazole (PROTONIX) 40 MG tablet Take 40 mg by mouth daily.   polyethylene  glycol powder (GLYCOLAX/MIRALAX) 17 GM/SCOOP powder Take 17 g by mouth daily as needed for constipation.   QUEtiapine (SEROQUEL) 50 MG tablet Take 50 mg by mouth at bedtime.   senna-docusate (SENOKOT-S) 8.6-50 MG tablet Take 1 tablet by mouth 2 (two) times daily.   No facility-administered encounter medications on file as of 08/14/2022.    Review of Systems  Immunization History  Administered Date(s) Administered   Covid-19, Mrna,Vaccine(Spikevax)72yrs and older 07/07/2022   H1N1 03/07/2008    Influenza-Unspecified 01/13/2022   Moderna Covid-19 Vaccine Bivalent Booster 76yrs & up 08/26/2021, 02/06/2022   Moderna SARS-COV2 Booster Vaccination 08/16/2020, 12/20/2020   Moderna Sars-Covid-2 Vaccination 04/10/2019, 05/08/2019   Pfizer Covid-19 Vaccine Bivalent Booster 29yrs & up 12/20/2020   Pneumococcal Polysaccharide-23 03/30/2008, 03/31/2015   Td 06/09/2013   Tdap 06/09/2013, 06/29/2021   Zoster Recombinat (Shingrix) 03/31/2015   Zoster, Live 10/18/2011   Pertinent  Health Maintenance Due  Topic Date Due   INFLUENZA VACCINE  10/29/2022   COLONOSCOPY (Pts 45-57yrs Insurance coverage will need to be confirmed)  Discontinued      05/24/2021    8:35 AM 05/25/2021    2:52 AM 05/25/2021    8:20 AM 06/28/2021    5:28 PM 06/29/2021    9:20 AM  Fall Risk  (RETIRED) Patient Fall Risk Level High fall risk High fall risk High fall risk High fall risk High fall risk   Functional Status Survey:    Vitals:   08/14/22 0943 08/14/22 0944  BP: 98/63 (!) 102/59  Pulse: 79   Resp: 18   Temp: 97.8 F (36.6 C)   SpO2: 94%   Weight: 159 lb (72.1 kg)   Height: 5\' 9"  (1.753 m)    Body mass index is 23.48 kg/m. Physical Exam Cardiovascular:     Rate and Rhythm: Normal rate.     Pulses: Normal pulses.  Pulmonary:     Effort: Pulmonary effort is normal.  Neurological:     Mental Status: He is alert. Mental status is at baseline. He is disoriented.     Labs reviewed: Recent Labs    04/04/22 0000 04/06/22 0000 08/03/22 0000  NA 142 138 140  K 4.4 4.0 3.8  CL 105 101 105  CO2 30* 29* 28*  BUN 20 22* 15  CREATININE 1.7* 0.8 0.9  CALCIUM 9.6 9.0 9.1   Recent Labs    10/30/21 0000 08/03/22 0000  AST 9* 14  ALT 3* 6*  ALKPHOS 78 69  ALBUMIN 4.2 3.9   Recent Labs    10/30/21 0000 06/25/22 0000 08/03/22 0000  WBC 5.6 6.8 6.8  NEUTROABS  --  4,515.00 4,420.00  HGB 13.8 14.0 14.9  HCT 41 42 45  PLT 199 186 206   No results found for: "TSH" No results found for:  "HGBA1C" Lab Results  Component Value Date   CHOL 173 05/15/2017   HDL 37 (L) 05/15/2017   LDLCALC 119 (H) 05/15/2017   TRIG 85 05/15/2017   CHOLHDL 4.7 05/15/2017    Significant Diagnostic Results in last 30 days:  No results found.  Assessment/Plan 2. 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 Follows with neurology. Continue sinemet as prescribed.   3. Benign essential hypertension No medications at this time, Patient continues to have low blood pressure despite adequate hydration. Some concern that   4. Dementia associated with Parkinson's disease [facility]) Patient continues to have progression of memory deficits difficult to redirect to prevent falls  and encourage use of call bell. He does maintain his weight well. Continues to feed himself. Continue seroquel 50 at night for aiding sleep-wake cycle. At this time does not take any other medications for dementia.   5. Benign non-nodular prostatic hyperplasia without lower urinary tract symptoms Symptoms well-controlled at this tim  6. DNR (do not resuscitate) Patient has DNR status.     Family/ staff Communication: nursing  Labs/tests ordered:  none

## 2022-08-23 DIAGNOSIS — Z9181 History of falling: Secondary | ICD-10-CM | POA: Diagnosis not present

## 2022-08-23 DIAGNOSIS — R32 Unspecified urinary incontinence: Secondary | ICD-10-CM | POA: Diagnosis not present

## 2022-08-23 DIAGNOSIS — G20A1 Parkinson's disease without dyskinesia, without mention of fluctuations: Secondary | ICD-10-CM | POA: Diagnosis not present

## 2022-08-23 DIAGNOSIS — F0280 Dementia in other diseases classified elsewhere without behavioral disturbance: Secondary | ICD-10-CM | POA: Diagnosis not present

## 2022-08-23 DIAGNOSIS — M1990 Unspecified osteoarthritis, unspecified site: Secondary | ICD-10-CM | POA: Diagnosis not present

## 2022-08-23 DIAGNOSIS — I10 Essential (primary) hypertension: Secondary | ICD-10-CM | POA: Diagnosis not present

## 2022-08-28 DIAGNOSIS — M1990 Unspecified osteoarthritis, unspecified site: Secondary | ICD-10-CM | POA: Diagnosis not present

## 2022-08-28 DIAGNOSIS — G20A1 Parkinson's disease without dyskinesia, without mention of fluctuations: Secondary | ICD-10-CM | POA: Diagnosis not present

## 2022-08-28 DIAGNOSIS — R32 Unspecified urinary incontinence: Secondary | ICD-10-CM | POA: Diagnosis not present

## 2022-08-28 DIAGNOSIS — Z9181 History of falling: Secondary | ICD-10-CM | POA: Diagnosis not present

## 2022-08-28 DIAGNOSIS — I10 Essential (primary) hypertension: Secondary | ICD-10-CM | POA: Diagnosis not present

## 2022-08-28 DIAGNOSIS — F0280 Dementia in other diseases classified elsewhere without behavioral disturbance: Secondary | ICD-10-CM | POA: Diagnosis not present

## 2022-08-29 DIAGNOSIS — Z741 Need for assistance with personal care: Secondary | ICD-10-CM | POA: Diagnosis not present

## 2022-08-29 DIAGNOSIS — R4189 Other symptoms and signs involving cognitive functions and awareness: Secondary | ICD-10-CM | POA: Diagnosis not present

## 2022-08-29 DIAGNOSIS — R278 Other lack of coordination: Secondary | ICD-10-CM | POA: Diagnosis not present

## 2022-08-29 DIAGNOSIS — I10 Essential (primary) hypertension: Secondary | ICD-10-CM | POA: Diagnosis not present

## 2022-08-29 DIAGNOSIS — R2689 Other abnormalities of gait and mobility: Secondary | ICD-10-CM | POA: Diagnosis not present

## 2022-08-29 DIAGNOSIS — R32 Unspecified urinary incontinence: Secondary | ICD-10-CM | POA: Diagnosis not present

## 2022-08-29 DIAGNOSIS — F0280 Dementia in other diseases classified elsewhere without behavioral disturbance: Secondary | ICD-10-CM | POA: Diagnosis not present

## 2022-08-29 DIAGNOSIS — M1990 Unspecified osteoarthritis, unspecified site: Secondary | ICD-10-CM | POA: Diagnosis not present

## 2022-08-29 DIAGNOSIS — M6281 Muscle weakness (generalized): Secondary | ICD-10-CM | POA: Diagnosis not present

## 2022-08-29 DIAGNOSIS — G20A1 Parkinson's disease without dyskinesia, without mention of fluctuations: Secondary | ICD-10-CM | POA: Diagnosis not present

## 2022-08-29 DIAGNOSIS — Z9181 History of falling: Secondary | ICD-10-CM | POA: Diagnosis not present

## 2022-08-31 DIAGNOSIS — G20A1 Parkinson's disease without dyskinesia, without mention of fluctuations: Secondary | ICD-10-CM | POA: Diagnosis not present

## 2022-08-31 DIAGNOSIS — F0280 Dementia in other diseases classified elsewhere without behavioral disturbance: Secondary | ICD-10-CM | POA: Diagnosis not present

## 2022-08-31 DIAGNOSIS — R32 Unspecified urinary incontinence: Secondary | ICD-10-CM | POA: Diagnosis not present

## 2022-08-31 DIAGNOSIS — I10 Essential (primary) hypertension: Secondary | ICD-10-CM | POA: Diagnosis not present

## 2022-08-31 DIAGNOSIS — Z9181 History of falling: Secondary | ICD-10-CM | POA: Diagnosis not present

## 2022-08-31 DIAGNOSIS — M1990 Unspecified osteoarthritis, unspecified site: Secondary | ICD-10-CM | POA: Diagnosis not present

## 2022-09-01 DIAGNOSIS — M1990 Unspecified osteoarthritis, unspecified site: Secondary | ICD-10-CM | POA: Diagnosis not present

## 2022-09-01 DIAGNOSIS — Z9181 History of falling: Secondary | ICD-10-CM | POA: Diagnosis not present

## 2022-09-01 DIAGNOSIS — G20A1 Parkinson's disease without dyskinesia, without mention of fluctuations: Secondary | ICD-10-CM | POA: Diagnosis not present

## 2022-09-01 DIAGNOSIS — I10 Essential (primary) hypertension: Secondary | ICD-10-CM | POA: Diagnosis not present

## 2022-09-01 DIAGNOSIS — R32 Unspecified urinary incontinence: Secondary | ICD-10-CM | POA: Diagnosis not present

## 2022-09-01 DIAGNOSIS — F0280 Dementia in other diseases classified elsewhere without behavioral disturbance: Secondary | ICD-10-CM | POA: Diagnosis not present

## 2022-09-03 DIAGNOSIS — R32 Unspecified urinary incontinence: Secondary | ICD-10-CM | POA: Diagnosis not present

## 2022-09-03 DIAGNOSIS — Z9181 History of falling: Secondary | ICD-10-CM | POA: Diagnosis not present

## 2022-09-03 DIAGNOSIS — F0280 Dementia in other diseases classified elsewhere without behavioral disturbance: Secondary | ICD-10-CM | POA: Diagnosis not present

## 2022-09-03 DIAGNOSIS — G20A1 Parkinson's disease without dyskinesia, without mention of fluctuations: Secondary | ICD-10-CM | POA: Diagnosis not present

## 2022-09-03 DIAGNOSIS — M1990 Unspecified osteoarthritis, unspecified site: Secondary | ICD-10-CM | POA: Diagnosis not present

## 2022-09-03 DIAGNOSIS — I10 Essential (primary) hypertension: Secondary | ICD-10-CM | POA: Diagnosis not present

## 2022-09-17 ENCOUNTER — Non-Acute Institutional Stay: Payer: Medicare Other | Admitting: Hospice

## 2022-09-17 DIAGNOSIS — Z515 Encounter for palliative care: Secondary | ICD-10-CM | POA: Diagnosis not present

## 2022-09-17 DIAGNOSIS — R451 Restlessness and agitation: Secondary | ICD-10-CM | POA: Diagnosis not present

## 2022-09-17 DIAGNOSIS — F0280 Dementia in other diseases classified elsewhere without behavioral disturbance: Secondary | ICD-10-CM

## 2022-09-17 DIAGNOSIS — R2681 Unsteadiness on feet: Secondary | ICD-10-CM | POA: Diagnosis not present

## 2022-09-17 DIAGNOSIS — G20A1 Parkinson's disease without dyskinesia, without mention of fluctuations: Secondary | ICD-10-CM | POA: Diagnosis not present

## 2022-09-17 NOTE — Progress Notes (Signed)
Therapist, nutritional Palliative Care Consult Note Telephone: [phone]  Fax: [phone]    PATIENT NAME: [name] [address] [place] [address]   [phone] (home)  DOB: [birth year] MRN: [id number] PRIMARY CARE PROVIDER:    Earnestine Mealing, MD,  [address] [place] [address] [phone]  REFERRING PROVIDER:   Earnestine Mealing, MD [address] [place],  [place] [address] [phone]  RESPONSIBLE PARTY:    Contact Information     Name Relation Home Work Mobile   Sicks,Stephanie Niece [phone]  [phone]   Couch,[name] Relative [phone]  [phone]   couch,charles Other   [phone]        I met face to face with patient in the facility. Palliative Care was asked to follow this patient by consultation request of  Earnestine Mealing, MD to address advance care planning and complex medical decision making. This is a follow up visit.                                    ASSESSMENT AND PLAN / RECOMMENDATIONS:   Advance Care Planning/Goals of Care: Goals include to maximize quality of life and symptom management.   CODE STATUS: DNR  Symptom Management/Plan:  Gait instability: Patient is a high fall risk, recent fall two weeks ago with injuries.  Patient will benefit from PT/OT for strengthening and gait training.  Continue alarm attached to wheelchair /recliner.   Round often on patient to identify and address needs. Fall precautions.   Dementia: Worsening memory loss/confusion in the context of Parkinson disease;  incontinent of bowel and bladder, FAST 7A, provide cueing and redirection as needed. Continue sinemet as ordered, neurologist consult as needed/planned.   Encourage reminiscence, and assistance with activities of daily living.  Fall/safety precautions.   Agitation/hallucination: Recent dose increase Seroquel 25 mg in the morning and 75 mg at bedtime daily.  Use de-escalation techniques of  redirection, calm approach and environmental modulation.   Follow-up with neurologist as planned.   Follow up Palliative Care Visit: Palliative care will continue to follow for complex medical decision making, advance care planning, and clarification of goals. Return 4-6 weeks or prn.  PPS: 40%  HOSPICE ELIGIBILITY/DIAGNOSIS: TBD  Chief Complaint: Palliative Medicine follow up visit.   HISTORY OF PRESENT ILLNESS:  Lucas Edwards is a 72 y.o. year old male  with multiple morbidities requiring close monitoring with high risk of complications and mortality: Parkinson's disease, dementia, agitation, hallucination, constipation, GERD, essential hypertension, REM behavioral disorder, muscle spasms, history of falls.  Patient at nursing station for nursing surveillance, in  no acute distress. History obtained from review of EMR, discussion with primary team, and interview with family, facility staff/caregiver and/or Mr. Sedlar.  Rest of 10 point ROS asked and negative. I reviewed available labs, medications, imaging, studies and related documents from the EMR.  Records reviewed and summarized above.   Physical Exam GEN: Frail, in no acute distress  Cardiac: S1 S2, no edema in BLE Respiratory: clear to auscultation, no wheezing, no cough GI: soft, nontender, nondistended, + BS   Musculoskeletal:Unsteady gait, ambulatory with rolling walker.   Neurological: Alert and oriented x 1, memory loss Psychiatric: Flat affect, difficult to redirect Hem/lymph/immuno: no widespread bruising  I spent 35 minutes providing this consultation; this includes time spent with patient/family, chart review and documentation. More than 50% of the time in this  consultation was spent on counseling and coordinating communication.  Thank you for the opportunity to participate in the care of Lucas Edwards. Please call our office at [phone] if we can be of additional assistance.   Rosaura Carpenter, NP

## 2022-09-22 DIAGNOSIS — I7091 Generalized atherosclerosis: Secondary | ICD-10-CM | POA: Diagnosis not present

## 2022-09-22 DIAGNOSIS — B351 Tinea unguium: Secondary | ICD-10-CM | POA: Diagnosis not present

## 2022-09-24 DIAGNOSIS — R319 Hematuria, unspecified: Secondary | ICD-10-CM | POA: Diagnosis not present

## 2022-10-06 ENCOUNTER — Non-Acute Institutional Stay: Payer: Self-pay | Admitting: Nurse Practitioner

## 2022-10-06 ENCOUNTER — Encounter: Payer: Self-pay | Admitting: Nurse Practitioner

## 2022-10-06 DIAGNOSIS — K219 Gastro-esophageal reflux disease without esophagitis: Secondary | ICD-10-CM

## 2022-10-06 DIAGNOSIS — I10 Essential (primary) hypertension: Secondary | ICD-10-CM | POA: Diagnosis not present

## 2022-10-06 DIAGNOSIS — G20B2 Parkinson's disease with dyskinesia, with fluctuations: Secondary | ICD-10-CM

## 2022-10-06 DIAGNOSIS — G20A1 Parkinson's disease without dyskinesia, without mention of fluctuations: Secondary | ICD-10-CM | POA: Diagnosis not present

## 2022-10-06 DIAGNOSIS — F0280 Dementia in other diseases classified elsewhere without behavioral disturbance: Secondary | ICD-10-CM

## 2022-10-06 DIAGNOSIS — R296 Repeated falls: Secondary | ICD-10-CM | POA: Diagnosis not present

## 2022-10-06 DIAGNOSIS — K5904 Chronic idiopathic constipation: Secondary | ICD-10-CM

## 2022-10-06 NOTE — Progress Notes (Unsigned)
Location:  Other [facility]) Nursing Home Room Number: 411 A Place of Service:  SNF (31)  Demontray Franta K. Janyth Contes, NP    Patient Care Team: Earnestine Mealing, MD as PCP - General (Family Medicine) Iran Ouch, MD as PCP - Cardiology (Cardiology)  Extended Emergency Contact Information Primary Emergency Contact: Hillary [place], [place] [address] Darden Amber of [place] Home Phone: [phone] Mobile Phone: [phone] Relation: Niece Secondary Emergency Contact: Couch,[name] Address: [address]          [place], [place] [address] Darden Amber of [place] Home Phone: [phone] Mobile Phone: [phone] Relation: Relative  Goals of care: Advanced Directive information    10/06/2022    8:26 AM  Advanced Directives  Does Patient Have a Medical Advance Directive? Yes  Type of Advance Directive Out of facility DNR (pink MOST or yellow form)  Does patient want to make changes to medical advance directive? No - Patient declined  Pre-existing out of facility DNR order (yellow form or pink MOST form) Yellow form placed in chart (order not valid for inpatient use)     Chief Complaint  Patient presents with   Medical Management of Chronic Issues    Routine visit. Discuss need for shingrix, covid, and pneumonia vaccines. NCIR verified.     HPI:  Pt is a 72 y.o. male seen today for medical management of chronic disease. Pt with parkinson dementia with behaviors,  Pt with ongoing behavioral issues, he has been started on seroquel which has been beneficial at times.  Bowels moving well on current regimen.  Continues to be unsteady and high fall risk despite increase supervision and fall precautions in place.    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HCC)    Essential hypertension, benign    GERD (gastroesophageal reflux  disease)    Hyperlipidemia    Parkinson disease    Squamous cell carcinoma of skin 04/22/2017   mid vertex scalp   Squamous cell carcinoma of skin 06/05/2019   crown scalp   Urinary incontinence    Past Surgical History:  Procedure Laterality Date   HIP ARTHROPLASTY Left 08/06/2017   Procedure: ARTHROPLASTY BIPOLAR HIP (HEMIARTHROPLASTY);  Surgeon: Christena Flake, MD;  Location: [facility] ORS;  Service: Orthopedics;  Laterality: Left;   INGUINAL HERNIA REPAIR Bilateral    VASECTOMY      Allergies  Allergen Reactions   Ativan [Lorazepam] Other (See Comments)    confusion   Gabapentin    Sulfa Antibiotics Hives   Tylenol [Acetaminophen] Itching    Outpatient Encounter Medications as of 10/06/2022  Medication Sig   amoxicillin (AMOXIL) 500 MG capsule Take 2,000 mg by mouth as directed. Before Dental Procedures   carbidopa-levodopa (SINEMET CR) 50-200 MG tablet Take  Sinemet 50/200 mg one tablet every two hours ( 6am, 8am, 10am, 12pm, 2pm, 4pm 6pm, 8pm)   famotidine (PEPCID) 10 MG tablet Take 10 mg by mouth at bedtime.   guaiFENesin-dextromethorphan (ROBITUSSIN DM) 100-10 MG/5ML syrup Take 5 mLs by mouth every 4 (four) hours as needed for cough.   hydrocortisone 2.5 % lotion Apply 1 Application topically daily.   Infant Care Products [facility] EX) Apply to buttocks after perineal care every shift.   Lidocaine 4 % PTCH Apply 1 patch topically daily as needed for pain.   Menthol-Zinc Oxide (  CALMOSEPTINE) 0.44-20.6 % OINT Apply 1 Application topically daily. As needed to buttocks for irritation   polyethylene glycol powder (GLYCOLAX/MIRALAX) 17 GM/SCOOP powder Take 17 g by mouth daily as needed for constipation.   QUEtiapine (SEROQUEL) 100 MG tablet Take 100 mg by mouth at bedtime. See other listing for daytime dose   QUEtiapine (SEROQUEL) 50 MG tablet Take 50 mg by mouth daily.   senna-docusate (SENOKOT-S) 8.6-50 MG tablet Take 1 tablet by mouth 2 (two) times daily.   [DISCONTINUED]  LORazepam (ATIVAN) 1 MG tablet Take 0.5 mg by mouth. Every 24 hours as needed. Give 1/2 tablet by mouth prior to dental, repeat 1/2 tablet if needed.   [DISCONTINUED] pantoprazole (PROTONIX) 40 MG tablet Take 40 mg by mouth daily.   No facility-administered encounter medications on file as of 10/06/2022.    Review of Systems  Unable to perform ROS: Dementia     Immunization History  Administered Date(s) Administered   Covid-19, Mrna,Vaccine(Spikevax)40yrs and older 07/07/2022   H1N1 03/07/2008   Influenza-Unspecified 01/13/2022   Moderna Covid-19 Vaccine Bivalent Booster 46yrs & up 08/26/2021, 02/06/2022   Moderna SARS-COV2 Booster Vaccination 08/16/2020, 12/20/2020   Moderna Sars-Covid-2 Vaccination 04/10/2019, 05/08/2019   Pfizer Covid-19 Vaccine Bivalent Booster 3yrs & up 12/20/2020   Pneumococcal Polysaccharide-23 03/30/2008, 03/31/2015   Td 06/09/2013   Tdap 06/09/2013, 06/29/2021   Zoster Recombinant(Shingrix) 03/31/2015   Zoster, Live 10/18/2011   Pertinent  Health Maintenance Due  Topic Date Due   INFLUENZA VACCINE  10/29/2022   Colonoscopy  Discontinued      05/24/2021    8:35 AM 05/25/2021    2:52 AM 05/25/2021    8:20 AM 06/28/2021    5:28 PM 06/29/2021    9:20 AM  Fall Risk  (RETIRED) Patient Fall Risk Level High fall risk High fall risk High fall risk High fall risk High fall risk   Functional Status Survey:    Vitals:   10/06/22 0824  BP: 136/77  Pulse: 77  Weight: 154 lb 6.4 oz (70 kg)  Height: 5\' 9"  (1.753 m)   Body mass index is 22.8 kg/m. Physical Exam Constitutional:      General: He is not in acute distress.    Appearance: He is well-developed. He is not diaphoretic.  HENT:     Head: Normocephalic and atraumatic.     Right Ear: External ear normal.     Left Ear: External ear normal.     Mouth/Throat:     Pharynx: No oropharyngeal exudate.  Eyes:     Conjunctiva/sclera: Conjunctivae normal.     Pupils: Pupils are equal, round, and reactive to  light.  Cardiovascular:     Rate and Rhythm: Normal rate and regular rhythm.     Heart sounds: Normal heart sounds.  Pulmonary:     Effort: Pulmonary effort is normal.     Breath sounds: Normal breath sounds.  Abdominal:     General: Bowel sounds are normal.     Palpations: Abdomen is soft.  Musculoskeletal:        General: No tenderness.     Cervical back: Normal range of motion and neck supple.     Right lower leg: No edema.     Left lower leg: No edema.  Skin:    General: Skin is warm and dry.  Neurological:     Mental Status: He is alert. He is disoriented.     Motor: Weakness present.     Gait: Gait abnormal.  Psychiatric:  Cognition and Memory: Cognition is impaired. Memory is impaired. He exhibits impaired recent memory and impaired remote memory.     Labs reviewed: Recent Labs    04/04/22 0000 04/06/22 0000 08/03/22 0000  NA 142 138 140  K 4.4 4.0 3.8  CL 105 101 105  CO2 30* 29* 28*  BUN 20 22* 15  CREATININE 1.7* 0.8 0.9  CALCIUM 9.6 9.0 9.1   Recent Labs    10/30/21 0000 08/03/22 0000  AST 9* 14  ALT 3* 6*  ALKPHOS 78 69  ALBUMIN 4.2 3.9   Recent Labs    10/30/21 0000 06/25/22 0000 08/03/22 0000  WBC 5.6 6.8 6.8  NEUTROABS  --  4,515.00 4,420.00  HGB 13.8 14.0 14.9  HCT 41 42 45  PLT 199 186 206   No results found for: "TSH" No results found for: "HGBA1C" Lab Results  Component Value Date   CHOL 173 05/15/2017   HDL 37 (L) 05/15/2017   LDLCALC 119 (H) 05/15/2017   TRIG 85 05/15/2017   CHOLHDL 4.7 05/15/2017    Significant Diagnostic Results in last 30 days:  No results found.  Assessment/Plan 1. Parkinson's disease with dyskinesia and fluctuating manifestations -advanced disease, continues on sinemet q2 hours.   2. Benign essential hypertension -Blood pressure well controlled, goal bp <140/90 No longer on medication  3. 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HCC) -advanced, continue supportive   5. Frequent  falls Continue fall precuations.   6. Chronic idiopathic constipation -stable on current regimen.   7. Gastroesophageal reflux disease without esophagitis -stable on pepcid     Davonna Ertl K. Biagio Borg [facility] [phone]

## 2022-10-09 ENCOUNTER — Non-Acute Institutional Stay (SKILLED_NURSING_FACILITY): Payer: Medicare Other | Admitting: Student

## 2022-10-09 ENCOUNTER — Encounter: Payer: Self-pay | Admitting: Student

## 2022-10-09 ENCOUNTER — Telehealth: Payer: Medicare Other | Admitting: Student

## 2022-10-09 DIAGNOSIS — G20A1 Parkinson's disease without dyskinesia, without mention of fluctuations: Secondary | ICD-10-CM

## 2022-10-09 DIAGNOSIS — F0280 Dementia in other diseases classified elsewhere without behavioral disturbance: Secondary | ICD-10-CM

## 2022-10-09 MED ORDER — NUPLAZID 34 MG PO CAPS: 1.0000 | ORAL_CAPSULE | Freq: Every day | ORAL | 9 refills | Status: DC

## 2022-10-09 NOTE — Progress Notes (Signed)
Location:  Other [facility].  Nursing Home Room Number: [facility] 411A Place of Service:  SNF [phone]) Provider:  Earnestine Mealing, MD  Patient Care Team: Earnestine Mealing, MD as PCP - General (Family Medicine) Iran Ouch, MD as PCP - Cardiology (Cardiology)  Extended Emergency Contact Information Primary Emergency Contact: Hillary [place], [place] [address] Darden Amber of [place] Home Phone: [phone] Mobile Phone: [phone] Relation: Niece Secondary Emergency Contact: Couch,[name] Address: [address]          [place], [place] [address] Darden Amber of [place] Home Phone: [phone] Mobile Phone: [phone] Relation: Relative  Code Status:  DNR Goals of care: Advanced Directive information    10/09/2022   10:43 AM  Advanced Directives  Does Patient Have a Medical Advance Directive? Yes  Type of Advance Directive Out of facility DNR (pink MOST or yellow form)  Does patient want to make changes to medical advance directive? No - Patient declined     Chief Complaint  Patient presents with   Acute Visit    Behavior     HPI:  Pt is a 72 y.o. male seen today for an acute visit for Behaviors.   Patient is sleeping deeply at this time.   Nursing states patient becomes combative while awake. He stands and grabs nurses by the arms. Not redirectable.   Spoke with his niece Judeth Cornfield. She knows his dementia is progressing and having more difficulty with eating. Physical therapy is changing due to his instability. His diagnosis was 15 years ago. He has progressed significantly. He has lived in NH for 5 years. Does not want him to go back to the hospital and remain DNR. Would not want life saving measures. He does not want something to prolong the inevitable if nothing would be done. If he is ill, would want to transition comfort measures if he does get ill. At this time she would be open to transitioning patient to Hospice level of  care. Discussed the last palliative care. She plans to come visit on 7/26 to see in person as well.     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HCC)    Essential hypertension, benign    GERD (gastroesophageal reflux disease)    Hyperlipidemia    Parkinson disease    Squamous cell carcinoma of skin 04/22/2017   mid vertex scalp   Squamous cell carcinoma of skin 06/05/2019   crown scalp   Urinary incontinence    Past Surgical History:  Procedure Laterality Date   HIP ARTHROPLASTY Left 08/06/2017   Procedure: ARTHROPLASTY BIPOLAR HIP (HEMIARTHROPLASTY);  Surgeon: Christena Flake, MD;  Location: [facility] ORS;  Service: Orthopedics;  Laterality: Left;   INGUINAL HERNIA REPAIR Bilateral    VASECTOMY      Allergies  Allergen Reactions   Ativan [Lorazepam] Other (See Comments)    confusion   Gabapentin    Sulfa Antibiotics Hives   Tylenol [Acetaminophen] Itching    Outpatient Encounter Medications as of 10/09/2022  Medication Sig   amoxicillin (AMOXIL) 500 MG capsule Take 2,000 mg by mouth as directed. Before Dental Procedures   carbidopa-levodopa (SINEMET IR) 25-100 MG tablet Take 2 tablets by mouth 4 (four) times daily.   famotidine (PEPCID) 10 MG tablet Take 10 mg by mouth at bedtime.   guaiFENesin-dextromethorphan (ROBITUSSIN  DM) 100-10 MG/5ML syrup Take 5 mLs by mouth every 4 (four) hours as needed for cough.   hydrocortisone 2.5 % lotion Apply 1 Application topically daily.   Infant Care Products [facility] EX) Apply to buttocks after perineal care every shift.   Lidocaine 4 % PTCH Apply 1 patch topically daily as needed for pain.   Menthol-Zinc Oxide (CALMOSEPTINE) 0.44-20.6 % OINT Apply 1 Application topically daily. As needed to buttocks for irritation   polyethylene glycol powder (GLYCOLAX/MIRALAX) 17 GM/SCOOP powder Take 17 g by mouth daily as needed  for constipation.   QUEtiapine (SEROQUEL) 100 MG tablet Take 100 mg by mouth at bedtime. See other listing for daytime dose   QUEtiapine (SEROQUEL) 50 MG tablet Take 50 mg by mouth daily.   senna-docusate (SENOKOT-S) 8.6-50 MG tablet Take 1 tablet by mouth 2 (two) times daily.   [DISCONTINUED] carbidopa-levodopa (SINEMET CR) 50-200 MG tablet Take  Sinemet 50/200 mg one tablet every two hours ( 6am, 8am, 10am, 12pm, 2pm, 4pm 6pm, 8pm)   No facility-administered encounter medications on file as of 10/09/2022.    Review of Systems  Immunization History  Administered Date(s) Administered   Covid-19, Mrna,Vaccine(Spikevax)59yrs and older 07/07/2022   H1N1 03/07/2008   Influenza-Unspecified 01/13/2022   Moderna Covid-19 Vaccine Bivalent Booster 21yrs & up 08/26/2021, 02/06/2022   Moderna SARS-COV2 Booster Vaccination 08/16/2020, 12/20/2020   Moderna Sars-Covid-2 Vaccination 04/10/2019, 05/08/2019   Pfizer Covid-19 Vaccine Bivalent Booster 27yrs & up 12/20/2020   Pneumococcal Polysaccharide-23 03/30/2008, 03/31/2015   Td 06/09/2013   Tdap 06/09/2013, 06/29/2021   Zoster Recombinant(Shingrix) 03/31/2015   Zoster, Live 10/18/2011   Pertinent  Health Maintenance Due  Topic Date Due   INFLUENZA VACCINE  10/29/2022   Colonoscopy  Discontinued      05/24/2021    8:35 AM 05/25/2021    2:52 AM 05/25/2021    8:20 AM 06/28/2021    5:28 PM 06/29/2021    9:20 AM  Fall Risk  (RETIRED) Patient Fall Risk Level High fall risk High fall risk High fall risk High fall risk High fall risk   Functional Status Survey:    Vitals:   10/09/22 1036  BP: 136/77  Pulse: 77  Resp: 16  Temp: 97.7 F (36.5 C)  SpO2: 96%  Weight: 154 lb 6.4 oz (70 kg)  Height: 5\' 9"  (1.753 m)   Body mass index is 22.8 kg/m. Physical Exam Constitutional:      Comments: Sleeping in reclined chair leaning forward, drooling.   Cardiovascular:     Rate and Rhythm: Normal rate.     Pulses: Normal pulses.  Pulmonary:      Effort: Pulmonary effort is normal.  Skin:    General: Skin is warm and dry.  Neurological:     Comments: Grimaces to painful stimuli     Labs reviewed: Recent Labs    04/04/22 0000 04/06/22 0000 08/03/22 0000  NA 142 138 140  K 4.4 4.0 3.8  CL 105 101 105  CO2 30* 29* 28*  BUN 20 22* 15  CREATININE 1.7* 0.8 0.9  CALCIUM 9.6 9.0 9.1   Recent Labs    10/30/21 0000 08/03/22 0000  AST 9* 14  ALT 3* 6*  ALKPHOS 78 69  ALBUMIN 4.2 3.9   Recent Labs    10/30/21 0000 06/25/22 0000 08/03/22 0000  WBC 5.6 6.8 6.8  NEUTROABS  --  4,515.00 4,420.00  HGB 13.8 14.0 14.9  HCT 41 42 45  PLT 199 186 206  No results found for: "TSH" No results found for: "HGBA1C" Lab Results  Component Value Date   CHOL 173 05/15/2017   HDL 37 (L) 05/15/2017   LDLCALC 119 (H) 05/15/2017   TRIG 85 05/15/2017   CHOLHDL 4.7 05/15/2017    Significant Diagnostic Results in last 30 days:  No results found.  Assessment/Plan 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HCC) - Plan: Ambulatory referral to Hospice Patient's Parkinson's dementia has progressed to the point that he is a danger to himself and others. He has periodic refusal of eating. His FAST 7A with incontinence and decreased verbal communication. Patient currently has Seroquel 100mg  nightly and PRN Seroquel. Will ad Pimavanserin to treat his psychosis. Discussed concern this could contribute to more falls as he has had numerous, however, with a goal of maintaining safety of patient and staff will trial initiation of this therapy. Based on stage of dementia will transition to hospice at this time.  POA does not desire hospitalizations and stated he will maintain DNR status. If additional medications are necessary, will provide comfort focused medications.   Family/ staff Communication: Nursing, Judeth Cornfield  Labs/tests ordered:  none  I spent greater than 30  minutes for the care of this patient in face to face time, chart review,  clinical documentation, patient education. I spent an additional 16 minutes discussing goals of care and advanced care planning.

## 2022-10-09 NOTE — Addendum Note (Signed)
Addended by: Earnestine Mealing on: 10/09/2022 02:21 PM   Modules accepted: Orders

## 2022-10-09 NOTE — Telephone Encounter (Signed)
[facility] Provider Location: [facility] Patient Location: [facility]  [employer] for insurance purposes. Answered questions for prior authorization. Patient was approved from Today until March 30, 2023.   Prior Authorizaiton number: [id number]  I spent greater than 30 minutes for the care of this patient on the phone for coordination of care.   Coralyn Helling, MD, [facility] [phone]

## 2022-10-13 DIAGNOSIS — K219 Gastro-esophageal reflux disease without esophagitis: Secondary | ICD-10-CM | POA: Diagnosis not present

## 2022-10-13 DIAGNOSIS — R630 Anorexia: Secondary | ICD-10-CM | POA: Diagnosis not present

## 2022-10-13 DIAGNOSIS — I10 Essential (primary) hypertension: Secondary | ICD-10-CM | POA: Diagnosis not present

## 2022-10-13 DIAGNOSIS — E785 Hyperlipidemia, unspecified: Secondary | ICD-10-CM | POA: Diagnosis not present

## 2022-10-13 DIAGNOSIS — R634 Abnormal weight loss: Secondary | ICD-10-CM | POA: Diagnosis not present

## 2022-10-13 DIAGNOSIS — N400 Enlarged prostate without lower urinary tract symptoms: Secondary | ICD-10-CM | POA: Diagnosis not present

## 2022-10-13 DIAGNOSIS — G20A1 Parkinson's disease without dyskinesia, without mention of fluctuations: Secondary | ICD-10-CM | POA: Diagnosis not present

## 2022-10-13 DIAGNOSIS — F02811 Dementia in other diseases classified elsewhere, unspecified severity, with agitation: Secondary | ICD-10-CM | POA: Diagnosis not present

## 2022-10-14 DIAGNOSIS — R630 Anorexia: Secondary | ICD-10-CM | POA: Diagnosis not present

## 2022-10-14 DIAGNOSIS — G20A1 Parkinson's disease without dyskinesia, without mention of fluctuations: Secondary | ICD-10-CM | POA: Diagnosis not present

## 2022-10-14 DIAGNOSIS — I10 Essential (primary) hypertension: Secondary | ICD-10-CM | POA: Diagnosis not present

## 2022-10-14 DIAGNOSIS — E785 Hyperlipidemia, unspecified: Secondary | ICD-10-CM | POA: Diagnosis not present

## 2022-10-14 DIAGNOSIS — F02811 Dementia in other diseases classified elsewhere, unspecified severity, with agitation: Secondary | ICD-10-CM | POA: Diagnosis not present

## 2022-10-14 DIAGNOSIS — R634 Abnormal weight loss: Secondary | ICD-10-CM | POA: Diagnosis not present

## 2022-10-17 DIAGNOSIS — E785 Hyperlipidemia, unspecified: Secondary | ICD-10-CM | POA: Diagnosis not present

## 2022-10-17 DIAGNOSIS — F02811 Dementia in other diseases classified elsewhere, unspecified severity, with agitation: Secondary | ICD-10-CM | POA: Diagnosis not present

## 2022-10-17 DIAGNOSIS — I10 Essential (primary) hypertension: Secondary | ICD-10-CM | POA: Diagnosis not present

## 2022-10-17 DIAGNOSIS — G20A1 Parkinson's disease without dyskinesia, without mention of fluctuations: Secondary | ICD-10-CM | POA: Diagnosis not present

## 2022-10-17 DIAGNOSIS — R634 Abnormal weight loss: Secondary | ICD-10-CM | POA: Diagnosis not present

## 2022-10-17 DIAGNOSIS — R630 Anorexia: Secondary | ICD-10-CM | POA: Diagnosis not present

## 2022-10-20 DIAGNOSIS — R634 Abnormal weight loss: Secondary | ICD-10-CM | POA: Diagnosis not present

## 2022-10-20 DIAGNOSIS — G20A1 Parkinson's disease without dyskinesia, without mention of fluctuations: Secondary | ICD-10-CM | POA: Diagnosis not present

## 2022-10-20 DIAGNOSIS — R630 Anorexia: Secondary | ICD-10-CM | POA: Diagnosis not present

## 2022-10-20 DIAGNOSIS — F02811 Dementia in other diseases classified elsewhere, unspecified severity, with agitation: Secondary | ICD-10-CM | POA: Diagnosis not present

## 2022-10-20 DIAGNOSIS — E785 Hyperlipidemia, unspecified: Secondary | ICD-10-CM | POA: Diagnosis not present

## 2022-10-20 DIAGNOSIS — I10 Essential (primary) hypertension: Secondary | ICD-10-CM | POA: Diagnosis not present

## 2022-10-21 DIAGNOSIS — R634 Abnormal weight loss: Secondary | ICD-10-CM | POA: Diagnosis not present

## 2022-10-21 DIAGNOSIS — G20A1 Parkinson's disease without dyskinesia, without mention of fluctuations: Secondary | ICD-10-CM | POA: Diagnosis not present

## 2022-10-21 DIAGNOSIS — F02811 Dementia in other diseases classified elsewhere, unspecified severity, with agitation: Secondary | ICD-10-CM | POA: Diagnosis not present

## 2022-10-21 DIAGNOSIS — R630 Anorexia: Secondary | ICD-10-CM | POA: Diagnosis not present

## 2022-10-21 DIAGNOSIS — E785 Hyperlipidemia, unspecified: Secondary | ICD-10-CM | POA: Diagnosis not present

## 2022-10-21 DIAGNOSIS — I10 Essential (primary) hypertension: Secondary | ICD-10-CM | POA: Diagnosis not present

## 2022-10-22 DIAGNOSIS — F02811 Dementia in other diseases classified elsewhere, unspecified severity, with agitation: Secondary | ICD-10-CM | POA: Diagnosis not present

## 2022-10-22 DIAGNOSIS — I10 Essential (primary) hypertension: Secondary | ICD-10-CM | POA: Diagnosis not present

## 2022-10-22 DIAGNOSIS — R634 Abnormal weight loss: Secondary | ICD-10-CM | POA: Diagnosis not present

## 2022-10-22 DIAGNOSIS — R630 Anorexia: Secondary | ICD-10-CM | POA: Diagnosis not present

## 2022-10-22 DIAGNOSIS — G20A1 Parkinson's disease without dyskinesia, without mention of fluctuations: Secondary | ICD-10-CM | POA: Diagnosis not present

## 2022-10-22 DIAGNOSIS — E785 Hyperlipidemia, unspecified: Secondary | ICD-10-CM | POA: Diagnosis not present

## 2022-10-27 DIAGNOSIS — G20A1 Parkinson's disease without dyskinesia, without mention of fluctuations: Secondary | ICD-10-CM | POA: Diagnosis not present

## 2022-10-27 DIAGNOSIS — I10 Essential (primary) hypertension: Secondary | ICD-10-CM | POA: Diagnosis not present

## 2022-10-27 DIAGNOSIS — R634 Abnormal weight loss: Secondary | ICD-10-CM | POA: Diagnosis not present

## 2022-10-27 DIAGNOSIS — F02811 Dementia in other diseases classified elsewhere, unspecified severity, with agitation: Secondary | ICD-10-CM | POA: Diagnosis not present

## 2022-10-27 DIAGNOSIS — R630 Anorexia: Secondary | ICD-10-CM | POA: Diagnosis not present

## 2022-10-27 DIAGNOSIS — E785 Hyperlipidemia, unspecified: Secondary | ICD-10-CM | POA: Diagnosis not present

## 2022-10-29 DIAGNOSIS — R634 Abnormal weight loss: Secondary | ICD-10-CM | POA: Diagnosis not present

## 2022-10-29 DIAGNOSIS — R630 Anorexia: Secondary | ICD-10-CM | POA: Diagnosis not present

## 2022-10-29 DIAGNOSIS — E785 Hyperlipidemia, unspecified: Secondary | ICD-10-CM | POA: Diagnosis not present

## 2022-10-29 DIAGNOSIS — G20A1 Parkinson's disease without dyskinesia, without mention of fluctuations: Secondary | ICD-10-CM | POA: Diagnosis not present

## 2022-10-29 DIAGNOSIS — N400 Enlarged prostate without lower urinary tract symptoms: Secondary | ICD-10-CM | POA: Diagnosis not present

## 2022-10-29 DIAGNOSIS — K219 Gastro-esophageal reflux disease without esophagitis: Secondary | ICD-10-CM | POA: Diagnosis not present

## 2022-10-29 DIAGNOSIS — F02811 Dementia in other diseases classified elsewhere, unspecified severity, with agitation: Secondary | ICD-10-CM | POA: Diagnosis not present

## 2022-10-29 DIAGNOSIS — I10 Essential (primary) hypertension: Secondary | ICD-10-CM | POA: Diagnosis not present

## 2022-11-03 DIAGNOSIS — R630 Anorexia: Secondary | ICD-10-CM | POA: Diagnosis not present

## 2022-11-03 DIAGNOSIS — E785 Hyperlipidemia, unspecified: Secondary | ICD-10-CM | POA: Diagnosis not present

## 2022-11-03 DIAGNOSIS — G20A1 Parkinson's disease without dyskinesia, without mention of fluctuations: Secondary | ICD-10-CM | POA: Diagnosis not present

## 2022-11-03 DIAGNOSIS — I10 Essential (primary) hypertension: Secondary | ICD-10-CM | POA: Diagnosis not present

## 2022-11-03 DIAGNOSIS — F02811 Dementia in other diseases classified elsewhere, unspecified severity, with agitation: Secondary | ICD-10-CM | POA: Diagnosis not present

## 2022-11-03 DIAGNOSIS — R634 Abnormal weight loss: Secondary | ICD-10-CM | POA: Diagnosis not present

## 2022-11-05 DIAGNOSIS — I10 Essential (primary) hypertension: Secondary | ICD-10-CM | POA: Diagnosis not present

## 2022-11-05 DIAGNOSIS — R630 Anorexia: Secondary | ICD-10-CM | POA: Diagnosis not present

## 2022-11-05 DIAGNOSIS — E785 Hyperlipidemia, unspecified: Secondary | ICD-10-CM | POA: Diagnosis not present

## 2022-11-05 DIAGNOSIS — F02811 Dementia in other diseases classified elsewhere, unspecified severity, with agitation: Secondary | ICD-10-CM | POA: Diagnosis not present

## 2022-11-05 DIAGNOSIS — G20A1 Parkinson's disease without dyskinesia, without mention of fluctuations: Secondary | ICD-10-CM | POA: Diagnosis not present

## 2022-11-05 DIAGNOSIS — R634 Abnormal weight loss: Secondary | ICD-10-CM | POA: Diagnosis not present

## 2022-11-10 DIAGNOSIS — R630 Anorexia: Secondary | ICD-10-CM | POA: Diagnosis not present

## 2022-11-10 DIAGNOSIS — G20A1 Parkinson's disease without dyskinesia, without mention of fluctuations: Secondary | ICD-10-CM | POA: Diagnosis not present

## 2022-11-10 DIAGNOSIS — F02811 Dementia in other diseases classified elsewhere, unspecified severity, with agitation: Secondary | ICD-10-CM | POA: Diagnosis not present

## 2022-11-10 DIAGNOSIS — R634 Abnormal weight loss: Secondary | ICD-10-CM | POA: Diagnosis not present

## 2022-11-10 DIAGNOSIS — E785 Hyperlipidemia, unspecified: Secondary | ICD-10-CM | POA: Diagnosis not present

## 2022-11-10 DIAGNOSIS — I10 Essential (primary) hypertension: Secondary | ICD-10-CM | POA: Diagnosis not present

## 2022-11-12 DIAGNOSIS — G20A1 Parkinson's disease without dyskinesia, without mention of fluctuations: Secondary | ICD-10-CM | POA: Diagnosis not present

## 2022-11-12 DIAGNOSIS — R630 Anorexia: Secondary | ICD-10-CM | POA: Diagnosis not present

## 2022-11-12 DIAGNOSIS — F02811 Dementia in other diseases classified elsewhere, unspecified severity, with agitation: Secondary | ICD-10-CM | POA: Diagnosis not present

## 2022-11-12 DIAGNOSIS — I10 Essential (primary) hypertension: Secondary | ICD-10-CM | POA: Diagnosis not present

## 2022-11-12 DIAGNOSIS — R634 Abnormal weight loss: Secondary | ICD-10-CM | POA: Diagnosis not present

## 2022-11-12 DIAGNOSIS — E785 Hyperlipidemia, unspecified: Secondary | ICD-10-CM | POA: Diagnosis not present

## 2022-11-13 DIAGNOSIS — R630 Anorexia: Secondary | ICD-10-CM | POA: Diagnosis not present

## 2022-11-13 DIAGNOSIS — F02811 Dementia in other diseases classified elsewhere, unspecified severity, with agitation: Secondary | ICD-10-CM | POA: Diagnosis not present

## 2022-11-13 DIAGNOSIS — E785 Hyperlipidemia, unspecified: Secondary | ICD-10-CM | POA: Diagnosis not present

## 2022-11-13 DIAGNOSIS — I10 Essential (primary) hypertension: Secondary | ICD-10-CM | POA: Diagnosis not present

## 2022-11-13 DIAGNOSIS — R634 Abnormal weight loss: Secondary | ICD-10-CM | POA: Diagnosis not present

## 2022-11-13 DIAGNOSIS — G20A1 Parkinson's disease without dyskinesia, without mention of fluctuations: Secondary | ICD-10-CM | POA: Diagnosis not present

## 2022-11-17 DIAGNOSIS — G20A1 Parkinson's disease without dyskinesia, without mention of fluctuations: Secondary | ICD-10-CM | POA: Diagnosis not present

## 2022-11-17 DIAGNOSIS — F02811 Dementia in other diseases classified elsewhere, unspecified severity, with agitation: Secondary | ICD-10-CM | POA: Diagnosis not present

## 2022-11-17 DIAGNOSIS — I10 Essential (primary) hypertension: Secondary | ICD-10-CM | POA: Diagnosis not present

## 2022-11-17 DIAGNOSIS — E785 Hyperlipidemia, unspecified: Secondary | ICD-10-CM | POA: Diagnosis not present

## 2022-11-17 DIAGNOSIS — R634 Abnormal weight loss: Secondary | ICD-10-CM | POA: Diagnosis not present

## 2022-11-17 DIAGNOSIS — R630 Anorexia: Secondary | ICD-10-CM | POA: Diagnosis not present

## 2022-11-19 DIAGNOSIS — R634 Abnormal weight loss: Secondary | ICD-10-CM | POA: Diagnosis not present

## 2022-11-19 DIAGNOSIS — F02811 Dementia in other diseases classified elsewhere, unspecified severity, with agitation: Secondary | ICD-10-CM | POA: Diagnosis not present

## 2022-11-19 DIAGNOSIS — R630 Anorexia: Secondary | ICD-10-CM | POA: Diagnosis not present

## 2022-11-19 DIAGNOSIS — G20A1 Parkinson's disease without dyskinesia, without mention of fluctuations: Secondary | ICD-10-CM | POA: Diagnosis not present

## 2022-11-19 DIAGNOSIS — E785 Hyperlipidemia, unspecified: Secondary | ICD-10-CM | POA: Diagnosis not present

## 2022-11-19 DIAGNOSIS — I10 Essential (primary) hypertension: Secondary | ICD-10-CM | POA: Diagnosis not present

## 2022-11-20 DIAGNOSIS — E785 Hyperlipidemia, unspecified: Secondary | ICD-10-CM | POA: Diagnosis not present

## 2022-11-20 DIAGNOSIS — R630 Anorexia: Secondary | ICD-10-CM | POA: Diagnosis not present

## 2022-11-20 DIAGNOSIS — I10 Essential (primary) hypertension: Secondary | ICD-10-CM | POA: Diagnosis not present

## 2022-11-20 DIAGNOSIS — G20A1 Parkinson's disease without dyskinesia, without mention of fluctuations: Secondary | ICD-10-CM | POA: Diagnosis not present

## 2022-11-20 DIAGNOSIS — R634 Abnormal weight loss: Secondary | ICD-10-CM | POA: Diagnosis not present

## 2022-11-20 DIAGNOSIS — F02811 Dementia in other diseases classified elsewhere, unspecified severity, with agitation: Secondary | ICD-10-CM | POA: Diagnosis not present

## 2022-11-24 DIAGNOSIS — F02811 Dementia in other diseases classified elsewhere, unspecified severity, with agitation: Secondary | ICD-10-CM | POA: Diagnosis not present

## 2022-11-24 DIAGNOSIS — G20A1 Parkinson's disease without dyskinesia, without mention of fluctuations: Secondary | ICD-10-CM | POA: Diagnosis not present

## 2022-11-24 DIAGNOSIS — R634 Abnormal weight loss: Secondary | ICD-10-CM | POA: Diagnosis not present

## 2022-11-24 DIAGNOSIS — R630 Anorexia: Secondary | ICD-10-CM | POA: Diagnosis not present

## 2022-11-24 DIAGNOSIS — E785 Hyperlipidemia, unspecified: Secondary | ICD-10-CM | POA: Diagnosis not present

## 2022-11-24 DIAGNOSIS — I10 Essential (primary) hypertension: Secondary | ICD-10-CM | POA: Diagnosis not present

## 2022-11-26 DIAGNOSIS — E785 Hyperlipidemia, unspecified: Secondary | ICD-10-CM | POA: Diagnosis not present

## 2022-11-26 DIAGNOSIS — R630 Anorexia: Secondary | ICD-10-CM | POA: Diagnosis not present

## 2022-11-26 DIAGNOSIS — I10 Essential (primary) hypertension: Secondary | ICD-10-CM | POA: Diagnosis not present

## 2022-11-26 DIAGNOSIS — G20A1 Parkinson's disease without dyskinesia, without mention of fluctuations: Secondary | ICD-10-CM | POA: Diagnosis not present

## 2022-11-26 DIAGNOSIS — R634 Abnormal weight loss: Secondary | ICD-10-CM | POA: Diagnosis not present

## 2022-11-26 DIAGNOSIS — F02811 Dementia in other diseases classified elsewhere, unspecified severity, with agitation: Secondary | ICD-10-CM | POA: Diagnosis not present

## 2022-11-27 ENCOUNTER — Encounter: Payer: Self-pay | Admitting: Student

## 2022-11-27 ENCOUNTER — Non-Acute Institutional Stay (SKILLED_NURSING_FACILITY): Payer: Medicare Other | Admitting: Student

## 2022-11-27 DIAGNOSIS — Z66 Do not resuscitate: Secondary | ICD-10-CM | POA: Diagnosis not present

## 2022-11-27 DIAGNOSIS — R630 Anorexia: Secondary | ICD-10-CM | POA: Diagnosis not present

## 2022-11-27 DIAGNOSIS — Z515 Encounter for palliative care: Secondary | ICD-10-CM | POA: Diagnosis not present

## 2022-11-27 DIAGNOSIS — F0280 Dementia in other diseases classified elsewhere without behavioral disturbance: Secondary | ICD-10-CM

## 2022-11-27 DIAGNOSIS — G20A1 Parkinson's disease without dyskinesia, without mention of fluctuations: Secondary | ICD-10-CM | POA: Diagnosis not present

## 2022-11-27 DIAGNOSIS — E785 Hyperlipidemia, unspecified: Secondary | ICD-10-CM | POA: Diagnosis not present

## 2022-11-27 DIAGNOSIS — I10 Essential (primary) hypertension: Secondary | ICD-10-CM | POA: Diagnosis not present

## 2022-11-27 DIAGNOSIS — R634 Abnormal weight loss: Secondary | ICD-10-CM | POA: Diagnosis not present

## 2022-11-27 DIAGNOSIS — F02811 Dementia in other diseases classified elsewhere, unspecified severity, with agitation: Secondary | ICD-10-CM | POA: Diagnosis not present

## 2022-11-27 MED ORDER — LORAZEPAM 0.5 MG PO TABS: 0.5000 mg | ORAL_TABLET | Freq: Three times a day (TID) | ORAL | 0 refills | Status: DC | PRN

## 2022-11-27 MED ORDER — MORPHINE SULFATE (CONCENTRATE) 20 MG/ML PO SOLN: 5.0000 mg | ORAL | 0 refills | Status: DC | PRN

## 2022-11-27 NOTE — Progress Notes (Signed)
Location:  [facility] Room Number: 411 A Place of Service:  SNF [phone]) Provider:  Earnestine Mealing, MD  Patient Care Team: Earnestine Mealing, MD as PCP - General (Family Medicine) Iran Ouch, MD as PCP - Cardiology (Cardiology)  Extended Emergency Contact Information Primary Emergency Contact: Hillary Bow, [place] [address] D[place] of [place] Home Phone: [phone] Mobile Phone: [phone] Relation: Niece Secondary Emergency Contact: Couch,[name] Address: [address]          [place], [place] [address] D[place] of [place] Home Phone: [phone] Mobile Phone: [phone] Relation: Relative  Code Status:  DNR Goals of care: Advanced Directive information    11/27/2022    9:12 AM  Advanced Directives  Does Patient Have a Medical Advance Directive? Yes  Type of Advance Directive Out of facility DNR (pink MOST or yellow form);Healthcare Power of [place];Living will  Does patient want to make changes to medical advance directive? No - Patient declined  Copy of Healthcare Power of Attorney in Chart? Yes - validated most recent copy scanned in chart (See row information)  Pre-existing out of facility DNR order (yellow form or pink MOST form) Yellow form placed in chart (order not valid for inpatient use)     Chief Complaint  Patient presents with   Acute Visit    Hospice Care     HPI:  Pt is a 72 y.o. male seen today for an acute visit for acute decline. Patient had covid and received treatment. For the last 2 days has not been eating well. On hospice concern for decline due to PD. He has not had any medications today, however, somnolent.    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HCC)    Essential hypertension, benign    GERD (gastroesophageal reflux disease)     Hyperlipidemia    Parkinson disease    Squamous cell carcinoma of skin 04/22/2017   mid vertex scalp   Squamous cell carcinoma of skin 06/05/2019   crown scalp   Urinary incontinence    Past Surgical History:  Procedure Laterality Date   HIP ARTHROPLASTY Left 08/06/2017   Procedure: ARTHROPLASTY BIPOLAR HIP (HEMIARTHROPLASTY);  Surgeon: Christena Flake, MD;  Location: [facility] ORS;  Service: Orthopedics;  Laterality: Left;   INGUINAL HERNIA REPAIR Bilateral    VASECTOMY      Allergies  Allergen Reactions   Gabapentin    Sulfa Antibiotics Hives   Tylenol [Acetaminophen] Itching    Outpatient Encounter Medications as of 11/27/2022  Medication Sig   acetaminophen (TYLENOL) 500 MG tablet Take 1,000 mg by mouth every 8 (eight) hours as needed.   amoxicillin (AMOXIL) 500 MG capsule Take 2,000 mg by mouth as directed. Before Dental Procedures   carbidopa-levodopa (SINEMET IR) 25-100 MG tablet Take 2 tablets by mouth 4 (four) times daily.   famotidine (PEPCID) 10 MG tablet Take 10 mg by mouth at bedtime.   guaiFENesin-dextromethorphan (ROBITUSSIN DM) 100-10 MG/5ML syrup Take 5 mLs by mouth every 4 (four) hours as needed for cough.   hydrocortisone 2.5 % cream Apply 1 Application topically daily. For itching to scalp   Infant Care Products (DERMACLOUD EX) Apply to buttocks after perineal care every shift.   Lidocaine 4 % PTCH Apply 1 patch topically daily as needed for pain.   LORazepam (  ATIVAN) 2 MG tablet Take 2 mg by mouth as directed. Give 1 tablet by mouth one time only for pre-dental until 12/09/2022 23:59 GIVE ATIVAN 2MG  ONE HR PRIOR TO DENTAL PROCEDURE   Menthol-Zinc Oxide (CALMOSEPTINE) 0.44-20.6 % OINT Apply 1 Application topically daily. As needed to buttocks for irritation   OXYGEN Inhale 2 L into the lungs as needed (to Keep O2 above 90 %).   Pimavanserin Tartrate (NUPLAZID) 34 MG CAPS Take 1 capsule (34 mg total) by mouth daily.   polyethylene glycol powder (GLYCOLAX/MIRALAX) 17  GM/SCOOP powder Take 17 g by mouth daily as needed for constipation.   QUEtiapine (SEROQUEL) 100 MG tablet Take 100 mg by mouth at bedtime. See other listing for daytime dose   senna-docusate (SENOKOT-S) 8.6-50 MG tablet Take 1 tablet by mouth 2 (two) times daily.   [DISCONTINUED] hydrocortisone 2.5 % lotion Apply 1 Application topically daily.   No facility-administered encounter medications on file as of 11/27/2022.    Review of Systems  Immunization History  Administered Date(s) Administered   Covid-19, Mrna,Vaccine(Spikevax)82yrs and older 07/07/2022   H1N1 03/07/2008   Influenza-Unspecified 01/13/2022   Moderna Covid-19 Vaccine Bivalent Booster 2yrs & up 08/26/2021, 02/06/2022   Moderna SARS-COV2 Booster Vaccination 08/16/2020, 12/20/2020   Moderna Sars-Covid-2 Vaccination 04/10/2019, 05/08/2019   PNEUMOCOCCAL CONJUGATE-20 07/21/2022   Pfizer Covid-19 Vaccine Bivalent Booster 39yrs & up 12/20/2020   Pneumococcal Polysaccharide-23 03/30/2008, 03/31/2015   Td 06/09/2013   Tdap 06/09/2013, 06/29/2021   Zoster Recombinant(Shingrix) 03/31/2015, 08/18/2022   Zoster, Live 10/18/2011   Pertinent  Health Maintenance Due  Topic Date Due   INFLUENZA VACCINE  10/29/2022   Colonoscopy  Discontinued      05/24/2021    8:35 AM 05/25/2021    2:52 AM 05/25/2021    8:20 AM 06/28/2021    5:28 PM 06/29/2021    9:20 AM  Fall Risk  (RETIRED) Patient Fall Risk Level High fall risk High fall risk High fall risk High fall risk High fall risk   Functional Status Survey:    Vitals:   11/27/22 0911  BP: 122/66  Pulse: 89  Resp: 18  Temp: (!) 97 F (36.1 C)  SpO2: 92%  Weight: 151 lb 12.8 oz (68.9 kg)  Height: 5\' 9"  (1.753 m)   Body mass index is 22.42 kg/m. Physical Exam Constitutional:      Comments: somnolent  Cardiovascular:     Rate and Rhythm: Normal rate and regular rhythm.  Pulmonary:     Effort: Pulmonary effort is normal.     Breath sounds: Normal breath sounds.      Labs reviewed: Recent Labs    04/04/22 0000 04/06/22 0000 08/03/22 0000  NA 142 138 140  K 4.4 4.0 3.8  CL 105 101 105  CO2 30* 29* 28*  BUN 20 22* 15  CREATININE 1.7* 0.8 0.9  CALCIUM 9.6 9.0 9.1   Recent Labs    08/03/22 0000  AST 14  ALT 6*  ALKPHOS 69  ALBUMIN 3.9   Recent Labs    06/25/22 0000 08/03/22 0000  WBC 6.8 6.8  NEUTROABS 4,515.00 4,420.00  HGB 14.0 14.9  HCT 42 45  PLT 186 206   No results found for: "TSH" No results found for: "HGBA1C" Lab Results  Component Value Date   CHOL 173 05/15/2017   HDL 37 (L) 05/15/2017   LDLCALC 119 (H) 05/15/2017   TRIG 85 05/15/2017   CHOLHDL 4.7 05/15/2017    Significant Diagnostic Results in last 30  days:  No results found.  Assessment/Plan 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HCC) - Plan: morphine (ROXANOL) 20 MG/ML concentrated solution, LORazepam (ATIVAN) 0.5 MG tablet  DNR (do not resuscitate) - Plan: morphine (ROXANOL) 20 MG/ML concentrated solution, LORazepam (ATIVAN) 0.5 MG tablet  Hospice care - Plan: morphine (ROXANOL) 20 MG/ML concentrated solution, LORazepam (ATIVAN) 0.5 MG tablet Patient has had continued decline and is on hospice. Not eating and drinking at this time. Family aware.   Family/ staff Communication: nursing  Labs/tests ordered:  none

## 2022-11-28 DIAGNOSIS — I10 Essential (primary) hypertension: Secondary | ICD-10-CM | POA: Diagnosis not present

## 2022-11-28 DIAGNOSIS — G20A1 Parkinson's disease without dyskinesia, without mention of fluctuations: Secondary | ICD-10-CM | POA: Diagnosis not present

## 2022-11-28 DIAGNOSIS — R630 Anorexia: Secondary | ICD-10-CM | POA: Diagnosis not present

## 2022-11-28 DIAGNOSIS — E785 Hyperlipidemia, unspecified: Secondary | ICD-10-CM | POA: Diagnosis not present

## 2022-11-28 DIAGNOSIS — R634 Abnormal weight loss: Secondary | ICD-10-CM | POA: Diagnosis not present

## 2022-11-28 DIAGNOSIS — F02811 Dementia in other diseases classified elsewhere, unspecified severity, with agitation: Secondary | ICD-10-CM | POA: Diagnosis not present

## 2022-11-29 ENCOUNTER — Telehealth: Payer: Self-pay | Admitting: Family

## 2022-11-29 DIAGNOSIS — F02811 Dementia in other diseases classified elsewhere, unspecified severity, with agitation: Secondary | ICD-10-CM | POA: Diagnosis not present

## 2022-11-29 DIAGNOSIS — R630 Anorexia: Secondary | ICD-10-CM | POA: Diagnosis not present

## 2022-11-29 DIAGNOSIS — E785 Hyperlipidemia, unspecified: Secondary | ICD-10-CM | POA: Diagnosis not present

## 2022-11-29 DIAGNOSIS — I10 Essential (primary) hypertension: Secondary | ICD-10-CM | POA: Diagnosis not present

## 2022-11-29 DIAGNOSIS — K219 Gastro-esophageal reflux disease without esophagitis: Secondary | ICD-10-CM | POA: Diagnosis not present

## 2022-11-29 DIAGNOSIS — G20A1 Parkinson's disease without dyskinesia, without mention of fluctuations: Secondary | ICD-10-CM | POA: Diagnosis not present

## 2022-11-29 DIAGNOSIS — R634 Abnormal weight loss: Secondary | ICD-10-CM | POA: Diagnosis not present

## 2022-11-29 DIAGNOSIS — N400 Enlarged prostate without lower urinary tract symptoms: Secondary | ICD-10-CM | POA: Diagnosis not present

## 2022-12-29 NOTE — Telephone Encounter (Signed)
Call received from [facility] facility Nurse states patient's oxygen saturation dropped to 56% while walking from the bathroom.Nurse increased oxygen to 4 liters via nasal canula oxygen saturation improved 88% and 91 % with Duoneb treatment.Respiration 28 b/min.states patient status post treatment for COVID-19 and on Hospice but was unable to reach Hospice Nurse.request Morphine dose to be adjusted from 0.25 mg to 5 mg sol.Stat chest X-ray ordered. Please follow up.

## 2022-12-29 DEATH — deceased
